# Patient Record
Sex: Female | Born: 1980
Health system: Southern US, Community
[De-identification: ages and names within clinical notes are randomized; demographics above are authoritative.]

## PROBLEM LIST (undated history)

## (undated) ENCOUNTER — Inpatient Hospital Stay (HOSPITAL_COMMUNITY): Payer: Self-pay

## (undated) ENCOUNTER — Ambulatory Visit

## (undated) DIAGNOSIS — F410 Panic disorder [episodic paroxysmal anxiety] without agoraphobia: Secondary | ICD-10-CM

## (undated) DIAGNOSIS — D649 Anemia, unspecified: Secondary | ICD-10-CM

## (undated) DIAGNOSIS — J45909 Unspecified asthma, uncomplicated: Secondary | ICD-10-CM

## (undated) DIAGNOSIS — F32A Depression, unspecified: Secondary | ICD-10-CM

## (undated) DIAGNOSIS — F419 Anxiety disorder, unspecified: Secondary | ICD-10-CM

## (undated) DIAGNOSIS — J40 Bronchitis, not specified as acute or chronic: Secondary | ICD-10-CM

## (undated) DIAGNOSIS — F329 Major depressive disorder, single episode, unspecified: Secondary | ICD-10-CM

## (undated) DIAGNOSIS — K219 Gastro-esophageal reflux disease without esophagitis: Secondary | ICD-10-CM

## (undated) HISTORY — DX: Depression, unspecified: F32.A

## (undated) HISTORY — DX: Major depressive disorder, single episode, unspecified: F32.9

## (undated) HISTORY — PX: BREAST SURGERY: SHX581

## (undated) HISTORY — PX: CHOLECYSTECTOMY: SHX55

---

## 1999-07-19 ENCOUNTER — Inpatient Hospital Stay (HOSPITAL_COMMUNITY): Admission: AD | Admit: 1999-07-19 | Discharge: 1999-07-21 | Payer: Self-pay | Admitting: Obstetrics

## 2001-01-09 ENCOUNTER — Encounter: Payer: Self-pay | Admitting: Emergency Medicine

## 2001-01-09 ENCOUNTER — Emergency Department (HOSPITAL_COMMUNITY): Admission: EM | Admit: 2001-01-09 | Discharge: 2001-01-09 | Payer: Self-pay | Admitting: Emergency Medicine

## 2001-06-03 ENCOUNTER — Encounter: Payer: Self-pay | Admitting: Obstetrics & Gynecology

## 2001-06-03 ENCOUNTER — Inpatient Hospital Stay (HOSPITAL_COMMUNITY): Admission: AD | Admit: 2001-06-03 | Discharge: 2001-06-03 | Payer: Self-pay | Admitting: Obstetrics & Gynecology

## 2001-11-09 ENCOUNTER — Inpatient Hospital Stay (HOSPITAL_COMMUNITY): Admission: AD | Admit: 2001-11-09 | Discharge: 2001-11-11 | Payer: Self-pay | Admitting: *Deleted

## 2002-03-22 ENCOUNTER — Encounter: Admission: RE | Admit: 2002-03-22 | Discharge: 2002-03-22 | Payer: Self-pay | Admitting: Internal Medicine

## 2002-03-22 ENCOUNTER — Encounter: Payer: Self-pay | Admitting: Internal Medicine

## 2004-04-24 ENCOUNTER — Emergency Department (HOSPITAL_COMMUNITY): Admission: EM | Admit: 2004-04-24 | Discharge: 2004-04-24 | Payer: Self-pay | Admitting: Emergency Medicine

## 2004-05-30 ENCOUNTER — Inpatient Hospital Stay (HOSPITAL_COMMUNITY): Admission: AD | Admit: 2004-05-30 | Discharge: 2004-05-30 | Payer: Self-pay | Admitting: Family Medicine

## 2004-08-10 ENCOUNTER — Inpatient Hospital Stay (HOSPITAL_COMMUNITY): Admission: AD | Admit: 2004-08-10 | Discharge: 2004-08-10 | Payer: Self-pay | Admitting: *Deleted

## 2004-08-14 ENCOUNTER — Inpatient Hospital Stay (HOSPITAL_COMMUNITY): Admission: AD | Admit: 2004-08-14 | Discharge: 2004-08-14 | Payer: Self-pay | Admitting: Obstetrics & Gynecology

## 2004-08-15 ENCOUNTER — Ambulatory Visit (HOSPITAL_COMMUNITY): Admission: RE | Admit: 2004-08-15 | Discharge: 2004-08-15 | Payer: Self-pay | Admitting: Obstetrics & Gynecology

## 2004-08-26 ENCOUNTER — Emergency Department (HOSPITAL_COMMUNITY): Admission: EM | Admit: 2004-08-26 | Discharge: 2004-08-26 | Payer: Self-pay | Admitting: Emergency Medicine

## 2004-08-28 ENCOUNTER — Inpatient Hospital Stay (HOSPITAL_COMMUNITY): Admission: AD | Admit: 2004-08-28 | Discharge: 2004-08-28 | Payer: Self-pay | Admitting: Obstetrics & Gynecology

## 2004-09-02 ENCOUNTER — Inpatient Hospital Stay (HOSPITAL_COMMUNITY): Admission: AD | Admit: 2004-09-02 | Discharge: 2004-09-03 | Payer: Self-pay | Admitting: Obstetrics & Gynecology

## 2004-10-09 ENCOUNTER — Inpatient Hospital Stay (HOSPITAL_COMMUNITY): Admission: AD | Admit: 2004-10-09 | Discharge: 2004-10-09 | Payer: Self-pay | Admitting: Obstetrics & Gynecology

## 2004-11-01 ENCOUNTER — Inpatient Hospital Stay (HOSPITAL_COMMUNITY): Admission: AD | Admit: 2004-11-01 | Discharge: 2004-11-03 | Payer: Self-pay | Admitting: Obstetrics

## 2005-06-05 ENCOUNTER — Emergency Department (HOSPITAL_COMMUNITY): Admission: EM | Admit: 2005-06-05 | Discharge: 2005-06-05 | Payer: Self-pay | Admitting: Emergency Medicine

## 2005-06-07 ENCOUNTER — Emergency Department (HOSPITAL_COMMUNITY): Admission: EM | Admit: 2005-06-07 | Discharge: 2005-06-07 | Payer: Self-pay | Admitting: Emergency Medicine

## 2005-06-12 ENCOUNTER — Emergency Department (HOSPITAL_COMMUNITY): Admission: EM | Admit: 2005-06-12 | Discharge: 2005-06-12 | Payer: Self-pay | Admitting: Emergency Medicine

## 2005-07-06 ENCOUNTER — Encounter (INDEPENDENT_AMBULATORY_CARE_PROVIDER_SITE_OTHER): Payer: Self-pay | Admitting: *Deleted

## 2005-07-06 ENCOUNTER — Observation Stay (HOSPITAL_COMMUNITY): Admission: EM | Admit: 2005-07-06 | Discharge: 2005-07-07 | Payer: Self-pay | Admitting: Emergency Medicine

## 2005-11-26 ENCOUNTER — Emergency Department (HOSPITAL_COMMUNITY): Admission: EM | Admit: 2005-11-26 | Discharge: 2005-11-26 | Payer: Self-pay | Admitting: Emergency Medicine

## 2006-08-25 ENCOUNTER — Emergency Department (HOSPITAL_COMMUNITY): Admission: EM | Admit: 2006-08-25 | Discharge: 2006-08-25 | Payer: Self-pay | Admitting: Family Medicine

## 2006-09-09 ENCOUNTER — Emergency Department (HOSPITAL_COMMUNITY): Admission: EM | Admit: 2006-09-09 | Discharge: 2006-09-09 | Payer: Self-pay | Admitting: Emergency Medicine

## 2007-05-14 ENCOUNTER — Ambulatory Visit: Payer: Self-pay | Admitting: Obstetrics and Gynecology

## 2007-05-14 ENCOUNTER — Inpatient Hospital Stay (HOSPITAL_COMMUNITY): Admission: AD | Admit: 2007-05-14 | Discharge: 2007-05-14 | Payer: Self-pay | Admitting: Obstetrics & Gynecology

## 2007-06-21 ENCOUNTER — Inpatient Hospital Stay (HOSPITAL_COMMUNITY): Admission: AD | Admit: 2007-06-21 | Discharge: 2007-06-23 | Payer: Self-pay | Admitting: Obstetrics & Gynecology

## 2007-06-21 ENCOUNTER — Ambulatory Visit: Payer: Self-pay | Admitting: Obstetrics & Gynecology

## 2007-12-29 ENCOUNTER — Emergency Department (HOSPITAL_COMMUNITY): Admission: EM | Admit: 2007-12-29 | Discharge: 2007-12-29 | Payer: Self-pay | Admitting: Emergency Medicine

## 2008-09-30 ENCOUNTER — Emergency Department (HOSPITAL_COMMUNITY): Admission: EM | Admit: 2008-09-30 | Discharge: 2008-09-30 | Payer: Self-pay | Admitting: Emergency Medicine

## 2009-06-22 ENCOUNTER — Emergency Department (HOSPITAL_COMMUNITY): Admission: EM | Admit: 2009-06-22 | Discharge: 2009-06-22 | Payer: Self-pay | Admitting: Emergency Medicine

## 2010-03-31 ENCOUNTER — Emergency Department (HOSPITAL_COMMUNITY): Admission: EM | Admit: 2010-03-31 | Discharge: 2010-03-31 | Payer: Self-pay | Admitting: Emergency Medicine

## 2010-07-23 ENCOUNTER — Emergency Department (HOSPITAL_COMMUNITY)
Admission: EM | Admit: 2010-07-23 | Discharge: 2010-07-23 | Payer: Self-pay | Source: Home / Self Care | Admitting: Family Medicine

## 2010-12-06 NOTE — Op Note (Signed)
NAMEMIAYA, LAFONTANT               ACCOUNT NO.:  [id number]   MEDICAL RECORD NO.:  [id number]          PATIENT TYPE:  OBV   LOCATION:  1320                         FACILITY:  [facility]   PHYSICIAN:  Angelia Mould. Derrell Lolling, M.D.DATE OF BIRTH:  1980-08-20   DATE OF PROCEDURE:  07/06/2005  DATE OF DISCHARGE:  07/07/2005                                 OPERATIVE REPORT   PREOPERATIVE DIAGNOSIS:  Acute and chronic cholecystitis with  cholelithiasis.   POSTOPERATIVE DIAGNOSIS:  Acute and chronic cholecystitis with  cholelithiasis.   OPERATION PERFORMED:  Laparoscopic cholecystectomy with intraoperative  cholangiogram.   SURGEON:  Angelia Mould. Derrell Lolling, M.D.   FIRST ASSISTANT:  Wilmon Arms. Tsuei, M.D.   OPERATIVE INDICATIONS:  This is a 30 year old black female with a 6-week  history of intermittent attacks of epigastric pain, nausea and vomiting. She  had an ultrasound last month which showed gallstones. She was scheduled for  elective surgery later this month but had another severe attack this morning  and came to the emergency room. Her liver function tests are normal. Her  white blood cell count and serum lipase were normal. Pregnancy test is  negative. We elected to go ahead with surgery at this time.   OPERATIVE FINDINGS:  The patient had acute and chronic inflammation of the  gallbladder. There were extensive omental adhesions to the gallbladder. The  anatomy of the cystic duct, cystic artery and common bile duct were  conventional. The cholangiogram showed normal intrahepatic and extrahepatic  bile ducts, no filling defects, and no obstruction with good flow of  contrast into the duodenum. The liver, stomach, duodenum, small intestine,  large intestine and peritoneal surfaces were grossly normal to inspection.   OPERATIVE TECHNIQUE:  Following the induction of general endotracheal  anesthesia, the patient's abdomen was prepped and draped in a sterile  fashion. The patient was identified.  Intravenous antibiotics were given.  0.5% Marcaine with epinephrine was used as local infiltration anesthetic. A  curved transverse incision was made at the superior rim of the umbilicus.  The fascia was incised in the midline and the abdominal cavity entered under  direct vision. A 10 mm Hassan trocar was inserted and secured with a  pursestring suture of #0 Vicryl. Pneumoperitoneum was created. The video  camera was inserted with visualization and findings as described above. A 10  mm trocar was placed in the subxiphoid region and two 5 mm trocars placed in  the right mid abdomen. The patient was positioned. The anatomy was not clear  initially and the gallbladder fundus looked as if it could be the transverse  colon and so I began taking the gallbladder down from the fundus downward  until I could clearly identify the gallbladder. I then grasped the fundus of  the gallbladder and lifted it  up and then we took about 15 minutes to take  all the adhesions down off the gallbladder. Hemostasis was achieved with  electrocautery. We dissected down toward the infundibulum of the gallbladder  and identified that. We retracted the infundibulum laterally. We mobilized  the infundibulum by dividing the peritoneal attachments  both medial lateral  until we had good mobilization. We found that the patient had a medium size  cystic duct and a small cystic artery intimately associated with the cystic  duct. The cystic artery was isolated, clipped and divided. We then isolate  the cystic duct and placed a metal clip on it close to the gallbladder. A  cholangiogram catheter was inserted into the cystic duct. A cholangiogram  was obtained using the C-arm. The cholangiogram was basically normal showing  no filling defect, no obstruction and normal intrahepatic and extrahepatic  anatomy. The cholangiogram catheter was removed. The cystic duct was secured  with three metal clips and divided. The gallbladder  was dissected from its  bed with electrocautery, placed the specimen bag and removed.   We spent a great deal of time irrigating to make sure that all of the bloody  fluid was removed. At the completion of the case, there was no bleeding and  no bile leak whatsoever. The irrigation fluid was completely clear and the  clips looked secure on the artery and duct. The trocars were removed under  direct vision. There is no bleeding from trocar sites. Pneumoperitoneum was  released. The fascia at the umbilicus was closed with #0 Vicryl suture. The  skin incisions were closed with subcuticular sutures of 4-0 Monocryl and  Steri-Strips. Clean bandages were placed and the patient taken to the  recovery room in stable condition. Estimated blood loss was about 25 mL.  Complications none. Sponge, needle and instrument counts were correct.      Angelia Mould. Derrell Lolling, M.D.  Electronically Signed     HMI/MEDQ  D:  07/06/2005  T:  07/08/2005  Job:  [id number]

## 2010-12-06 NOTE — H&P (Signed)
Elizabeth Dean, SUPPA               ACCOUNT NO.:  [id number]   MEDICAL RECORD NO.:  [id number]          PATIENT TYPE:  OBV   LOCATION:  1320                         FACILITY:  [facility]   PHYSICIAN:  Angelia Mould. Derrell Lolling, M.D.DATE OF BIRTH:  September 18, 1980   DATE OF ADMISSION:  07/06/2005  DATE OF DISCHARGE:                                HISTORY & PHYSICAL   CHIEF COMPLAINT:  Epigastric pain and gallstones.   HISTORY OF PRESENT ILLNESS:  This is a 30 year old black female who has a  six week history of intermittent attacks of postprandial epigastric pain,  back pain, repeated nausea and vomiting.  She had an ultrasound on June 05, 2005 which shows gallstones, otherwise normal with common bile duct  measuring 4.3 mm.  She has fatty food intolerance with attacks of pain  tending to occur after meals or at night.  She saw Dr. Chevis Pretty in our  office a week or two ago and was scheduled for cholecystectomy on July 09, 2005.  She awoke this morning at 6:00 A.M. with another attack of  epigastric pain, back pain, nausea and vomiting.  She had a little bit of  diarrhea today but she normally is constipated. She has not had any fever or  chills, has no history of liver disease, cardiac disease or urologic  problems.  She came to the emergency room, asked to see the emergency room  doctor, was worked up by Dr. Ignacia Palma.  I was then called to see her because  she was still having some pain.  She is admitted for cholecystectomy.   PAST MEDICAL HISTORY:  Mild asthma, otherwise negative.   CURRENT MEDICATIONS:  Albuterol inhaler PRN.   ALLERGIES:  No known drug allergies.   SOCIAL HISTORY:  The patient lives in [place].  She is engaged to be  married.  She has never been married before. She has three children.  She  works at [employer] on [employer].  She smokes cigarettes  daily.  She drinks alcohol but rarely.   FAMILY HISTORY:  Mother, father, sister and brother are  all living and well  without any medical problems.   REVIEW OF SYSTEMS:  Fifteen systems review of systems is performed and is  noncontributory except as described above.   PHYSICAL EXAMINATION:   PHYSICAL EXAMINATION:  GENERAL APPEARANCE: Overweight young black woman in  mild distress.  VITAL SIGNS:  Temperature 98.1, pulse 101, respirations 28, blood pressure  135/84.  HEENT: Eyes: Sclerae clear. Extraocular movements intact. Ears, nose, mouth  and throat, nose, lips, tongue and oropharynx are without gross lesions.  NECK:  Supple, nontender, no mass.  No jugular venous distention.  LUNGS:  Clear to auscultation.  No chest wall tenderness.  No costovertebral  angle tenderness.  HEART:  Regular rate and rhythm with no murmurs.  Radial and posterior  tibial pulses are palpable.  No peripheral edema.  ABDOMEN:  Obese, soft, tender in the epigastrium and right upper quadrant  but really no guarding, rebound or mass.  No hernia.  No scars noted.  EXTREMITIES:  She moves all four extremities well without pain or deformity.  NEUROLOGICAL:  No gross motor or sensory deficits.   LABORATORY DATA:  I reviewed the ultrasound which reveals a few gallstones  and a normal common bile duct.  Pregnancy test normal.  Hemoglobin 11.8,  white blood cell count 9,800.  Liver function tests are normal.  Lipase is  28.   ASSESSMENT:  1.  Symptomatic gallstones.  2.  Mild asthma.  3.  Obesity.   PLAN:  1.  The patient will be admitted and taken to the operating room for a      laparoscopic cholecystectomy with cholangiogram.  2.  I have discussed the indications and details of the surgery with her.      The risks and complications have been outlined, including, but no      limited to bleeding, infection, conversion to open laparotomy, injury to      adjacent structures such as the main bile duct or intestine with major      reconstructive surgery, wound problems such as infection or hernia,       cardiac, pulmonary and thromboembolic problems.  She seems to understand      these issues well.  At this time all of her questions are answered.  She      is in full agreement with this plan and would like to be admitted and      have the surgery today.      Angelia Mould. Derrell Lolling, M.D.  Electronically Signed     HMI/MEDQ  D:  07/06/2005  T:  07/07/2005  Job:  [id number]

## 2011-01-10 ENCOUNTER — Emergency Department (HOSPITAL_COMMUNITY)
Admission: EM | Admit: 2011-01-10 | Discharge: 2011-01-10 | Disposition: A | Discharge status: Home / Self Care | Payer: Self-pay | Attending: Emergency Medicine | Admitting: Emergency Medicine

## 2011-01-10 DIAGNOSIS — M546 Pain in thoracic spine: Secondary | ICD-10-CM | POA: Insufficient documentation

## 2011-01-10 DIAGNOSIS — W208XXA Other cause of strike by thrown, projected or falling object, initial encounter: Secondary | ICD-10-CM | POA: Insufficient documentation

## 2011-01-10 DIAGNOSIS — M79609 Pain in unspecified limb: Secondary | ICD-10-CM | POA: Insufficient documentation

## 2011-01-10 DIAGNOSIS — S20229A Contusion of unspecified back wall of thorax, initial encounter: Secondary | ICD-10-CM | POA: Insufficient documentation

## 2011-04-28 LAB — URINALYSIS, ROUTINE W REFLEX MICROSCOPIC
Bilirubin Urine: NEGATIVE
Glucose, UA: NEGATIVE
Ketones, ur: 15 — AB
Specific Gravity, Urine: 1.015

## 2011-04-28 LAB — TYPE AND SCREEN

## 2011-04-28 LAB — CBC
Hemoglobin: 9 — ABNORMAL LOW
MCHC: 32.3
MCHC: 32.8
MCV: 80.4
MCV: 81.8
Platelets: 186
Platelets: 227
RDW: 15.7 — ABNORMAL HIGH
RDW: 16.4 — ABNORMAL HIGH

## 2011-04-28 LAB — RPR: RPR Ser Ql: NONREACTIVE

## 2011-04-28 LAB — RAPID HIV SCREEN (WH-MAU): Rapid HIV Screen: NONREACTIVE

## 2011-04-28 LAB — RUBELLA SCREEN: Rubella: 23.9 — ABNORMAL HIGH

## 2011-04-28 LAB — RAPID URINE DRUG SCREEN, HOSP PERFORMED
Amphetamines: NOT DETECTED
Cocaine: NOT DETECTED
Tetrahydrocannabinol: POSITIVE — AB

## 2011-04-28 LAB — HEPATITIS B SURFACE ANTIGEN: Hepatitis B Surface Ag: NEGATIVE

## 2011-04-28 LAB — DIFFERENTIAL
Lymphs Abs: 2
Neutrophils Relative %: 67

## 2011-04-28 LAB — ABO/RH: ABO/RH(D): B POS

## 2011-04-28 LAB — GC/CHLAMYDIA PROBE AMP, URINE: Chlamydia, Swab/Urine, PCR: NEGATIVE

## 2011-04-30 LAB — URINALYSIS, ROUTINE W REFLEX MICROSCOPIC
Hgb urine dipstick: NEGATIVE
Ketones, ur: NEGATIVE
Nitrite: NEGATIVE
Urobilinogen, UA: 0.2

## 2011-07-19 ENCOUNTER — Emergency Department (HOSPITAL_COMMUNITY): Payer: Self-pay

## 2011-07-19 ENCOUNTER — Encounter: Payer: Self-pay | Admitting: *Deleted

## 2011-07-19 ENCOUNTER — Emergency Department (HOSPITAL_COMMUNITY)
Admission: EM | Admit: 2011-07-19 | Discharge: 2011-07-19 | Disposition: A | Discharge status: Home / Self Care | Payer: Self-pay | Attending: Emergency Medicine | Admitting: Emergency Medicine

## 2011-07-19 DIAGNOSIS — R221 Localized swelling, mass and lump, neck: Secondary | ICD-10-CM | POA: Insufficient documentation

## 2011-07-19 DIAGNOSIS — R059 Cough, unspecified: Secondary | ICD-10-CM | POA: Insufficient documentation

## 2011-07-19 DIAGNOSIS — R05 Cough: Secondary | ICD-10-CM | POA: Insufficient documentation

## 2011-07-19 DIAGNOSIS — J069 Acute upper respiratory infection, unspecified: Secondary | ICD-10-CM | POA: Insufficient documentation

## 2011-07-19 DIAGNOSIS — R0602 Shortness of breath: Secondary | ICD-10-CM | POA: Insufficient documentation

## 2011-07-19 DIAGNOSIS — R5381 Other malaise: Secondary | ICD-10-CM | POA: Insufficient documentation

## 2011-07-19 DIAGNOSIS — IMO0001 Reserved for inherently not codable concepts without codable children: Secondary | ICD-10-CM | POA: Insufficient documentation

## 2011-07-19 DIAGNOSIS — R220 Localized swelling, mass and lump, head: Secondary | ICD-10-CM | POA: Insufficient documentation

## 2011-07-19 DIAGNOSIS — J3489 Other specified disorders of nose and nasal sinuses: Secondary | ICD-10-CM | POA: Insufficient documentation

## 2011-07-19 DIAGNOSIS — R5383 Other fatigue: Secondary | ICD-10-CM | POA: Insufficient documentation

## 2011-07-19 NOTE — ED Notes (Signed)
Pt in c/o cough, congestion, fever, and body aches x4 days

## 2011-07-19 NOTE — ED Provider Notes (Signed)
History     CSN: [id number]  Arrival date & time 07/19/11  1538   First MD Initiated Contact with Patient 07/19/11 1648      Chief Complaint  Patient presents with  . URI    (Consider location/radiation/quality/duration/timing/severity/associated sxs/prior treatment) HPI Comments: Patient treating at home with Tylenol with some relief.  Patient is a 30 y.o. female presenting with URI. The history is provided by the patient.  URI The primary symptoms include fatigue and myalgias. Primary symptoms do not include fever, headaches, ear pain, sore throat, cough, wheezing, abdominal pain, nausea, vomiting or rash. The current episode started 2 days ago. The problem has not changed since onset. Symptoms associated with the illness include congestion and rhinorrhea. The illness is not associated with chills, plugged ear sensation or sinus pressure.    History reviewed. No pertinent past medical history.  History reviewed. No pertinent past surgical history.  History reviewed. No pertinent family history.  History  Substance Use Topics  . Smoking status: Never Smoker   . Smokeless tobacco: Not on file  . Alcohol Use: No    OB History    Grav Para Term Preterm Abortions TAB SAB Ect Mult Living                  Review of Systems  Constitutional: Positive for fatigue. Negative for fever and chills.  HENT: Positive for congestion and rhinorrhea. Negative for ear pain, sore throat and sinus pressure.   Eyes: Negative for discharge.  Respiratory: Negative for cough, shortness of breath and wheezing.   Cardiovascular: Negative for chest pain.  Gastrointestinal: Negative for nausea, vomiting, abdominal pain, diarrhea and constipation.  Genitourinary: Negative for dysuria.  Musculoskeletal: Positive for myalgias.  Skin: Negative for rash.  Neurological: Negative for headaches.  Psychiatric/Behavioral: Negative for confusion.    Allergies  Hydrocodone  Home Medications    Current Outpatient Rx  Name Route Sig Dispense Refill  . ACETAMINOPHEN 160 MG/5ML PO SOLN Oral Take 325 mg by mouth every 4 (four) hours as needed. Fever/headache       BP 135/80  Pulse 90  Temp(Src) 98.6 F (37 C) (Oral)  Resp 20  SpO2 100%  Physical Exam  Nursing note and vitals reviewed. Constitutional: She is oriented to person, place, and time. She appears well-developed and well-nourished.  HENT:  Head: Normocephalic and atraumatic.  Right Ear: Tympanic membrane and external ear normal.  Left Ear: Tympanic membrane and external ear normal.  Nose: Mucosal edema and rhinorrhea present.  Mouth/Throat: Uvula is midline and oropharynx is clear and moist.  Eyes: Pupils are equal, round, and reactive to light. Right eye exhibits no discharge. Left eye exhibits no discharge.  Neck: Normal range of motion. Neck supple.  Cardiovascular: Normal rate and regular rhythm.   No murmur heard. Pulmonary/Chest: Effort normal and breath sounds normal. No respiratory distress. She has no wheezes.  Abdominal: Soft. There is no tenderness. There is no rebound and no guarding.  Musculoskeletal: Normal range of motion.  Neurological: She is alert and oriented to person, place, and time.  Skin: Skin is warm and dry. No rash noted.  Psychiatric: She has a normal mood and affect.    ED Course  Procedures (including critical care time)  Labs Reviewed - No data to display Dg Chest 2 View  07/19/2011  *RADIOLOGY REPORT*  Clinical Data: Cough.  Shortness of breath.  Chest pain and congestion.  History of smoking.  CHEST - 2 VIEW  Comparison: 11/26/2005  Findings: Heart size is normal.  The lungs are free of focal consolidations and pleural effusions.  No evidence for pulmonary edema.  Surgical clips are identified in the upper abdomen. Visualized osseous structures have a normal appearance.  IMPRESSION: Negative exam.  Original Report Authenticated By: Patterson Hammersmith, M.D.     1. Upper  respiratory tract infection    5:50 PM patient seen and examined. Chest x-ray performed and is negative. Chest x-ray reviewed by myself. Patient informed of x-ray results. Patient counseled on supportive care for viral URI and s/s to return including worsening symptoms, persistent fever, persistent vomiting, or if they have any other concerns.  Urged to see PCP if symptoms persist for more than 3 days. Patient verbalizes understanding and agrees with plan.     MDM  Patient with symptoms consistent with a viral syndrome.  Vitals are stable, no fever.  No signs of dehydration.  Lung exam normal, no signs of pneumonia.  Supportive therapy indicated with return if symptoms worsen.           Eustace Moore [place], [place] 07/19/11 [phone]

## 2011-07-20 NOTE — ED Provider Notes (Signed)
Medical screening examination/treatment/procedure(s) were performed by non-physician practitioner and as supervising physician I was immediately available for consultation/collaboration.    Namish Krise L Kirk Sampley, MD 07/20/11 1125 

## 2011-10-01 ENCOUNTER — Emergency Department (INDEPENDENT_AMBULATORY_CARE_PROVIDER_SITE_OTHER)
Admission: EM | Admit: 2011-10-01 | Discharge: 2011-10-01 | Disposition: A | Discharge status: Home / Self Care | Payer: Self-pay | Source: Home / Self Care | Attending: Family Medicine | Admitting: Family Medicine

## 2011-10-01 ENCOUNTER — Encounter (HOSPITAL_COMMUNITY): Payer: Self-pay

## 2011-10-01 DIAGNOSIS — S838X9A Sprain of other specified parts of unspecified knee, initial encounter: Secondary | ICD-10-CM

## 2011-10-01 DIAGNOSIS — S76119A Strain of unspecified quadriceps muscle, fascia and tendon, initial encounter: Secondary | ICD-10-CM

## 2011-10-01 NOTE — ED Notes (Addendum)
Patient states she fell 3 days ago, and has pain in her left leg from hip to knee; c/o she cannot stand all day as her job requires; ambulatory w limp; NAD; Pain "7" for past 3 days, but has not taken any pain medication

## 2011-10-01 NOTE — ED Provider Notes (Signed)
History     CSN: [id number]  Arrival date & time 10/01/11  1043   First MD Initiated Contact with Patient 10/01/11 1153      Chief Complaint  Patient presents with  . Leg Pain    (Consider location/radiation/quality/duration/timing/severity/associated sxs/prior treatment) HPI Comments: Elizabeth Dean presents for evaluation of left thigh. Pain. She denies any pain at rest, but reports pain with extension of her lower leg. She reports that she "slipped" the other day. When she was washing. Her car. She reports that her left foot slipped in that she had to catch herself. Most of her weight was then put on her left thigh. She denies fall. She denies any numbness, tingling, or weakness in the leg.  Patient is a 31 y.o. female presenting with leg pain. The history is provided by the patient.  Leg Pain  The incident occurred more than 2 days ago. The incident occurred at home. There was no injury mechanism. The pain is present in the left thigh.    History reviewed. No pertinent past medical history.  Past Surgical History  Procedure Date  . Tubal ligation     History reviewed. No pertinent family history.  History  Substance Use Topics  . Smoking status: Never Smoker   . Smokeless tobacco: Not on file  . Alcohol Use: No    OB History    Grav Para Term Preterm Abortions TAB SAB Ect Mult Living                  Review of Systems  Constitutional: Negative.   HENT: Negative.   Eyes: Negative.   Respiratory: Negative.   Cardiovascular: Negative.   Gastrointestinal: Negative.   Genitourinary: Negative.   Musculoskeletal: Positive for myalgias.  Skin: Negative.   Neurological: Negative.     Allergies  Hydrocodone  Home Medications   Current Outpatient Rx  Name Route Sig Dispense Refill  . ACETAMINOPHEN 160 MG/5ML PO SOLN Oral Take 325 mg by mouth every 4 (four) hours as needed. Fever/headache       BP 132/84  Pulse 99  Temp(Src) 98.3 F (36.8 C) (Oral)  Resp 20   SpO2 98%  LMP 09/03/2011  Physical Exam  Nursing note and vitals reviewed. Constitutional: She is oriented to person, place, and time. She appears well-developed and well-nourished.  HENT:  Head: Normocephalic and atraumatic.  Eyes: EOM are normal.  Neck: Normal range of motion.  Pulmonary/Chest: Effort normal.  Musculoskeletal: Normal range of motion.       Left upper leg: She exhibits tenderness. She exhibits no bony tenderness, no swelling and no edema.       Legs: Neurological: She is alert and oriented to person, place, and time.  Skin: Skin is warm and dry.  Psychiatric: Her behavior is normal.    ED Course  Procedures (including critical care time)  Labs Reviewed - No data to display No results found.   1. Quadriceps muscle strain       MDM  Muscle strain; advised OTC anti-inflammatories        Renaee Munda, MD 10/01/11 1319

## 2011-10-01 NOTE — Discharge Instructions (Signed)
The pain is likely need to a strain of your left thigh muscle. This most likely occurred when you slipped during the carwash the other day. You may take one to 2 Aleve tablets twice a day for the next 7-10 days. You may also use mild heat at night. Return to care should your symptoms not improve, or worsen in any way, such as numbness, tingling, or weakness.

## 2012-11-19 ENCOUNTER — Encounter (HOSPITAL_COMMUNITY): Payer: Self-pay | Admitting: *Deleted

## 2012-11-19 ENCOUNTER — Emergency Department (HOSPITAL_COMMUNITY)
Admission: EM | Admit: 2012-11-19 | Discharge: 2012-11-19 | Disposition: A | Discharge status: Home / Self Care | Payer: Self-pay | Attending: Emergency Medicine | Admitting: Emergency Medicine

## 2012-11-19 DIAGNOSIS — R209 Unspecified disturbances of skin sensation: Secondary | ICD-10-CM | POA: Insufficient documentation

## 2012-11-19 DIAGNOSIS — K0889 Other specified disorders of teeth and supporting structures: Secondary | ICD-10-CM

## 2012-11-19 DIAGNOSIS — S63619A Unspecified sprain of unspecified finger, initial encounter: Secondary | ICD-10-CM

## 2012-11-19 DIAGNOSIS — K089 Disorder of teeth and supporting structures, unspecified: Secondary | ICD-10-CM | POA: Insufficient documentation

## 2012-11-19 DIAGNOSIS — X58XXXA Exposure to other specified factors, initial encounter: Secondary | ICD-10-CM | POA: Insufficient documentation

## 2012-11-19 DIAGNOSIS — Y939 Activity, unspecified: Secondary | ICD-10-CM | POA: Insufficient documentation

## 2012-11-19 DIAGNOSIS — J45909 Unspecified asthma, uncomplicated: Secondary | ICD-10-CM | POA: Insufficient documentation

## 2012-11-19 DIAGNOSIS — S6390XA Sprain of unspecified part of unspecified wrist and hand, initial encounter: Secondary | ICD-10-CM | POA: Insufficient documentation

## 2012-11-19 DIAGNOSIS — Y929 Unspecified place or not applicable: Secondary | ICD-10-CM | POA: Insufficient documentation

## 2012-11-19 MED ORDER — OXYCODONE-ACETAMINOPHEN 5-325 MG PO TABS: ORAL_TABLET | ORAL | Status: DC

## 2012-11-19 MED ORDER — PENICILLIN V POTASSIUM 500 MG PO TABS: 500.0000 mg | ORAL_TABLET | Freq: Four times a day (QID) | ORAL | Status: DC

## 2012-11-19 NOTE — ED Provider Notes (Signed)
History     CSN: [id number]  Arrival date & time 11/19/12  1140   First MD Initiated Contact with Patient 11/19/12 1213      Chief Complaint  Patient presents with  . Finger Injury  . Dental Pain    (Consider location/radiation/quality/duration/timing/severity/associated sxs/prior treatment) HPI  Elizabeth Dean is a 32 y.o. female complaining of with history of asthma presents with right index finger pain and pins and needles paresthesia  from the tip to the PIP joint x 5 hours. Pt stated when she woke up this morning she noticed that she had constant pain in her right index finger and decreased range of motion of her MCP and PIP joints.  As the morning progressed she only noticed the pain when using the finger or moving it. She denies any recent illness, trauma to that hand, redness, swelling, fever, N/V, difficulty swallowing her secretions.   History reviewed. No pertinent past medical history.  Past Surgical History  Procedure Laterality Date  . Tubal ligation      History reviewed. No pertinent family history.  History  Substance Use Topics  . Smoking status: Never Smoker   . Smokeless tobacco: Not on file  . Alcohol Use: No    OB History   Grav Para Term Preterm Abortions TAB SAB Ect Mult Living                  Review of Systems  Constitutional: Negative for fever.  HENT: Positive for dental problem. Negative for ear pain, facial swelling, drooling, trouble swallowing, neck pain, neck stiffness and voice change.   Respiratory: Negative for shortness of breath.   Cardiovascular: Negative for chest pain.  Gastrointestinal: Negative for nausea, vomiting, abdominal pain and diarrhea.  Musculoskeletal: Positive for arthralgias.  All other systems reviewed and are negative.    Allergies  Hydrocodone  Home Medications   Current Outpatient Rx  Name  Route  Sig  Dispense  Refill  . acetaminophen (TYLENOL) 160 MG/5ML solution   Oral   Take 960 mg by mouth  every 4 (four) hours as needed for pain. Fever/headache           BP 125/73  Pulse 95  Temp(Src) 97.4 F (36.3 C) (Oral)  Resp 18  SpO2 100%  LMP 11/05/2012  Physical Exam  Nursing note and vitals reviewed. Constitutional: She is oriented to person, place, and time. She appears well-developed and well-nourished. No distress.  HENT:  Head: Normocephalic. No trismus in the jaw.  Mouth/Throat: Uvula is midline and oropharynx is clear and moist. No edematous. No oropharyngeal exudate, posterior oropharyngeal edema, posterior oropharyngeal erythema or tonsillar abscesses.    Generally poor dentition, no gingival swelling, moderate tenderness to palpation and erythema of the oral gingiva on the posterior lower left side. tenderness to palpation. Patient is handling  secretions. There is no tenderness to palpation or firmness underneath tongue bilaterally. No trismus.    Eyes: Conjunctivae and EOM are normal.  Cardiovascular: Normal rate.   Pulmonary/Chest: Effort normal. No stridor.  Musculoskeletal: Normal range of motion.  There is no swelling, deformity, erythema to right index finger right hand. Patient has excellent active range of motion she is neurovascularly intact with sensation that can distinguish between pinprick and light touch. There is no consistent tenderness to palpation along the flexor and extensionsor tendon. Patient is mostly tender on the bilateral lateral sides of the proximal phalanx    Lymphadenopathy:    She has no cervical adenopathy.  Neurological: She is alert and oriented to person, place, and time.  Skin:  Skin shows no signs of trauma, no erythema, no induration, no puncture wounds  Psychiatric: She has a normal mood and affect.    ED Course  Procedures (including critical care time)  Labs Reviewed - No data to display No results found.   1. Pain, dental   2. Finger sprain, initial encounter       MDM   Elizabeth Dean is a 31 y.o.  female with dental pain and likely dental abscess and pain to right second digit with no injury, there is no signs of trauma to the digit no puncture wounds. Patient has excellent range of motion and I doubt infectious tenosynovitis. Will place the patient in a finger splint.  Patient also has pain in gingival swelling to the posterior right lower jaw. I doubt Ludwig's angina.    Filed Vitals:   11/19/12 1144 11/19/12 1346  BP: 125/73 138/83  Pulse: 95 91  Temp: 97.4 F (36.3 C) 97.7 F (36.5 C)  TempSrc: Oral Oral  Resp: 18 18  SpO2: 100% 96%     Pt verbalized understanding and agrees with care plan. Outpatient follow-up and return precautions given.    Discharge Medication List as of 11/19/2012  1:24 PM    START taking these medications   Details  oxyCODONE-acetaminophen (PERCOCET/ROXICET) 5-325 MG per tablet 1 to 2 tabs PO q6hrs  PRN for pain, Print    penicillin v potassium (VEETID) 500 MG tablet Take 1 tablet (500 mg total) by mouth 4 (four) times daily., Starting 11/19/2012, Until Discontinued, Print               Wynetta Emery, PA-C 11/19/12 1600  [place], PA-C 11/19/12 1601

## 2012-11-19 NOTE — ED Notes (Signed)
Reports waking up this am with pain to right index finger, no injury to it. Also having swelling to left lower side mouth. Airway intact.

## 2012-11-19 NOTE — ED Provider Notes (Signed)
Medical screening examination/treatment/procedure(s) were performed by non-physician practitioner and as supervising physician I was immediately available for consultation/collaboration.  Flint Melter, MD 11/19/12 2146

## 2012-11-19 NOTE — Progress Notes (Signed)
Orthopedic Tech Progress Note Patient Details:  Elizabeth Dean [birth year] [id number]  Ortho Devices Type of Ortho Device: Finger splint Ortho Device/Splint Location: right hand Ortho Device/Splint Interventions: Application   Yasiel Goyne 11/19/2012, 1:44 PM

## 2013-06-28 ENCOUNTER — Emergency Department (HOSPITAL_COMMUNITY)
Admission: EM | Admit: 2013-06-28 | Discharge: 2013-06-28 | Disposition: A | Discharge status: Home / Self Care | Payer: Self-pay | Attending: Emergency Medicine | Admitting: Emergency Medicine

## 2013-06-28 DIAGNOSIS — K047 Periapical abscess without sinus: Secondary | ICD-10-CM | POA: Insufficient documentation

## 2013-06-28 DIAGNOSIS — R51 Headache: Secondary | ICD-10-CM | POA: Insufficient documentation

## 2013-06-28 DIAGNOSIS — K029 Dental caries, unspecified: Secondary | ICD-10-CM | POA: Insufficient documentation

## 2013-06-28 MED ORDER — NAPROXEN 500 MG PO TABS: 500.0000 mg | ORAL_TABLET | Freq: Two times a day (BID) | ORAL | Status: DC

## 2013-06-28 MED ORDER — OXYCODONE-ACETAMINOPHEN 5-325 MG PO TABS
1.0000 | ORAL_TABLET | Freq: Once | ORAL | Status: AC
  Administered 2013-06-28: 1 via ORAL
  Filled 2013-06-28: qty 1

## 2013-06-28 MED ORDER — OXYCODONE-ACETAMINOPHEN 5-325 MG PO TABS: 1.0000 | ORAL_TABLET | Freq: Four times a day (QID) | ORAL | Status: DC | PRN

## 2013-06-28 NOTE — ED Notes (Signed)
Left jaw swelling  Since 1 week ago started as bump on inside  Teeth hurt had a hole in her tooth

## 2013-06-28 NOTE — ED Notes (Signed)
Dental pain x 1 week to left side. Pain increasing. No fevers.

## 2013-06-28 NOTE — ED Provider Notes (Signed)
CSN: [id number]     Arrival date & time 06/28/13  1158 History  This chart was scribed for non-physician practitioner working with Derwood Kaplan, MD by Ashley Jacobs, ED scribe. This patient was seen in room TR06C/TR06C and the patient's care was started at 2:47 PM   First MD Initiated Contact with Patient 06/28/13 1242     Chief Complaint  Patient presents with  . Dental Pain   (Consider location/radiation/quality/duration/timing/severity/associated sxs/prior Treatment) The history is provided by the patient and medical records. No language interpreter was used.   HPI Comments: Elizabeth Dean is a 32 y.o. female who presents to the Emergency Department complaining of left lower jaw pain. Pt reports the having a small bump on the inside of her jaw line that started one week ago. She state having a small "hole" in her left lower tooth.  Pt is experiencing moderate left sided facial swelling. She has tried tylenol for pain. Pt denies fever and trouble swallowing. She has allergies to hydrocodone and no prior medical complications.  No past medical history on file. Past Surgical History  Procedure Laterality Date  . Tubal ligation     No family history on file. History  Substance Use Topics  . Smoking status: Never Smoker   . Smokeless tobacco: Not on file  . Alcohol Use: No   OB History   Grav Para Term Preterm Abortions TAB SAB Ect Mult Living                 Review of Systems  Constitutional: Negative for fever.  HENT: Positive for dental problem and facial swelling. Negative for ear pain, sore throat and trouble swallowing.   Respiratory: Negative for shortness of breath and stridor.   Musculoskeletal: Negative for neck pain.  Skin: Negative for color change.  Neurological: Positive for headaches.    Allergies  Hydrocodone  Home Medications   Current Outpatient Rx  Name  Route  Sig  Dispense  Refill  . acetaminophen (TYLENOL) 500 MG tablet   Oral   Take 1,000 mg  by mouth every 6 (six) hours as needed for moderate pain.          BP 135/87  Pulse 87  Temp(Src) 98.1 F (36.7 C) (Oral)  Resp 18  SpO2 99% Physical Exam  Nursing note and vitals reviewed. Constitutional: She appears well-developed and well-nourished. No distress.  HENT:  Head: Normocephalic and atraumatic.  Right Ear: Tympanic membrane, external ear and ear canal normal.  Left Ear: Tympanic membrane, external ear and ear canal normal.  Nose: Nose normal.  Mouth/Throat: Uvula is midline, oropharynx is clear and moist and mucous membranes are normal. No trismus in the jaw. Abnormal dentition. Dental caries present. No dental abscesses or uvula swelling. No tonsillar abscesses.  2 cm diameter area of induration consistent with abscess.  Over the angle of the left mandible.  Eyes: Conjunctivae and EOM are normal. Pupils are equal, round, and reactive to light.  Neck: Normal range of motion. Neck supple. No tracheal deviation present.  No neck swelling or Ludwig's angina  Cardiovascular: Normal rate.   Pulmonary/Chest: Effort normal. No respiratory distress.  Abdominal: Soft. She exhibits no distension.  Musculoskeletal: Normal range of motion.  Lymphadenopathy:    She has no cervical adenopathy.  Neurological: She is alert.  Skin: Skin is warm and dry.  Psychiatric: She has a normal mood and affect. Her behavior is normal.    ED Course  Procedures (including critical care time) DIAGNOSTIC  STUDIES: Oxygen Saturation is 99% on room air, normal by my interpretation.    COORDINATION OF CARE: 12:51 PM Discussed course of care with pt which includes oxycodone 325 mg. Pt understands and agrees.  Labs Review Labs Reviewed - No data to display Imaging Review No results found.  EKG Interpretation   None      Patient seen and examined.   Vital signs reviewed and are as follows: Filed Vitals:   06/28/13 1207  BP: 135/87  Pulse: 87  Temp: 98.1 F (36.7 C)  Resp: 18     Patient counseled to take prescribed medications as directed, return with worsening facial or neck swelling, and to follow-up with their dentist as soon as possible.    MDM   1. Dental abscess    Patient with toothache, facial swelling, suspected abscess. Abscess is deep and I do not think I can easily drain this. No induration palpable along gumline and abscess is deep to inside of mouth. Exam unconcerning for Ludwig's angina or other deep tissue infection in neck.  Will treat with penicillin and pain medicine.  Urged patient to follow-up with dentist.    I personally performed the services described in this documentation, which was scribed in my presence. The recorded information has been reviewed and is accurate.     Renne Crigler, PA-C 06/29/13 2230

## 2013-06-30 NOTE — ED Provider Notes (Signed)
Medical screening examination/treatment/procedure(s) were performed by non-physician practitioner and as supervising physician I was immediately available for consultation/collaboration.  EKG Interpretation   None        Charnele Semple, MD 06/30/13 2002 

## 2013-07-02 ENCOUNTER — Encounter (HOSPITAL_COMMUNITY): Payer: Self-pay | Admitting: Emergency Medicine

## 2013-07-02 ENCOUNTER — Emergency Department (HOSPITAL_COMMUNITY)
Admission: EM | Admit: 2013-07-02 | Discharge: 2013-07-02 | Disposition: A | Discharge status: Home / Self Care | Payer: Self-pay | Attending: Emergency Medicine | Admitting: Emergency Medicine

## 2013-07-02 DIAGNOSIS — L0291 Cutaneous abscess, unspecified: Secondary | ICD-10-CM

## 2013-07-02 DIAGNOSIS — L0201 Cutaneous abscess of face: Secondary | ICD-10-CM | POA: Insufficient documentation

## 2013-07-02 DIAGNOSIS — K089 Disorder of teeth and supporting structures, unspecified: Secondary | ICD-10-CM | POA: Insufficient documentation

## 2013-07-02 DIAGNOSIS — L0211 Cutaneous abscess of neck: Secondary | ICD-10-CM | POA: Insufficient documentation

## 2013-07-02 DIAGNOSIS — L03211 Cellulitis of face: Secondary | ICD-10-CM | POA: Insufficient documentation

## 2013-07-02 DIAGNOSIS — Z79899 Other long term (current) drug therapy: Secondary | ICD-10-CM | POA: Insufficient documentation

## 2013-07-02 MED ORDER — OXYCODONE-ACETAMINOPHEN 5-325 MG PO TABS: 2.0000 | ORAL_TABLET | Freq: Four times a day (QID) | ORAL | Status: DC | PRN

## 2013-07-02 MED ORDER — SULFAMETHOXAZOLE-TRIMETHOPRIM 800-160 MG PO TABS: 1.0000 | ORAL_TABLET | Freq: Two times a day (BID) | ORAL | Status: DC

## 2013-07-02 MED ORDER — OXYCODONE-ACETAMINOPHEN 5-325 MG PO TABS
2.0000 | ORAL_TABLET | Freq: Once | ORAL | Status: AC
  Administered 2013-07-02: 2 via ORAL
  Filled 2013-07-02: qty 2

## 2013-07-02 MED ORDER — ONDANSETRON 4 MG PO TBDP
8.0000 mg | ORAL_TABLET | Freq: Once | ORAL | Status: AC
  Administered 2013-07-02: 8 mg via ORAL
  Filled 2013-07-02: qty 2

## 2013-07-02 MED ORDER — PROMETHAZINE HCL 25 MG PO TABS: 25.0000 mg | ORAL_TABLET | Freq: Four times a day (QID) | ORAL | Status: DC | PRN

## 2013-07-02 MED ORDER — CEPHALEXIN 500 MG PO CAPS: 500.0000 mg | ORAL_CAPSULE | Freq: Four times a day (QID) | ORAL | Status: DC

## 2013-07-02 NOTE — ED Provider Notes (Signed)
CSN: [id number]     Arrival date & time 07/02/13  1439 History   First MD Initiated Contact with Patient 07/02/13 1606     Chief Complaint  Patient presents with  . Abscess   (Consider location/radiation/quality/duration/timing/severity/associated sxs/prior Treatment) HPI Comments: Patient is a 32 year old female who presents today with facial abscess. She reports that she was seen here a week ago for dental abscess and had some improvement with antibiotics. Last night she developed a large painful abscess on the left side of her face and neck. This is close to where the initial dental abscess was. Her boyfriend tried to squeeze it last night and was able to get some drainage out. She has a throbbing, burning pain. Nothing makes the pain better. Touching it makes the pain worse. She denies any fever, chills, nausea, vomiting, abdominal pain.   The history is provided by the patient. No language interpreter was used.    History reviewed. No pertinent past medical history. Past Surgical History  Procedure Laterality Date  . Tubal ligation     History reviewed. No pertinent family history. History  Substance Use Topics  . Smoking status: Never Smoker   . Smokeless tobacco: Not on file  . Alcohol Use: No   OB History   Grav Para Term Preterm Abortions TAB SAB Ect Mult Living                 Review of Systems  Constitutional: Negative for fever and chills.  HENT: Positive for dental problem. Negative for trouble swallowing.   Respiratory: Negative for shortness of breath.   Cardiovascular: Negative for chest pain.  Gastrointestinal: Negative for nausea, vomiting and abdominal pain.  Skin: Positive for wound.  All other systems reviewed and are negative.    Allergies  Hydrocodone  Home Medications   Current Outpatient Rx  Name  Route  Sig  Dispense  Refill  . acetaminophen (TYLENOL) 500 MG tablet   Oral   Take 500 mg by mouth every 4 (four) hours as needed for moderate  pain.          . naproxen (NAPROSYN) 500 MG tablet   Oral   Take 1 tablet (500 mg total) by mouth 2 (two) times daily.   20 tablet   0   . oxyCODONE-acetaminophen (PERCOCET/ROXICET) 5-325 MG per tablet   Oral   Take 1-2 tablets by mouth every 6 (six) hours as needed for severe pain.   10 tablet   0    BP 130/85  Pulse 86  Temp(Src) 98.2 F (36.8 C) (Oral)  Resp 18  Wt 153 lb 12.8 oz (69.763 kg)  SpO2 100% Physical Exam  Nursing note and vitals reviewed. Constitutional: She is oriented to person, place, and time. She appears well-developed and well-nourished. No distress.  HENT:  Head: Normocephalic and atraumatic.  Right Ear: External ear normal.  Left Ear: External ear normal.  Nose: Nose normal.  Mouth/Throat: Oropharynx is clear and moist.  5 cm area of induration and erythema to left submandibular area. No streaking. Active drainage at this time. No evidence of dental abscess causing cutanoues abscess. No trismus, submental edema, or tongue elevation. Generally good dentition.   Eyes: Conjunctivae are normal.  Neck: Normal range of motion.  Cardiovascular: Normal rate, regular rhythm and normal heart sounds.   Pulmonary/Chest: Effort normal and breath sounds normal. No stridor. No respiratory distress. She has no wheezes. She has no rales.  Abdominal: Soft. She exhibits no distension.  Musculoskeletal:  Normal range of motion.  Neurological: She is alert and oriented to person, place, and time. She has normal strength.  Skin: Skin is warm and dry. She is not diaphoretic. No erythema.  Psychiatric: She has a normal mood and affect. Her behavior is normal.    ED Course  Procedures (including critical care time) Labs Review Labs Reviewed - No data to display Imaging Review No results found.  EKG Interpretation   None      INCISION AND DRAINAGE Performed by: Junious Silk Consent: Verbal consent obtained. Risks and benefits: risks, benefits and  alternatives were discussed Type: abscess  Body area: left submandibular  Anesthesia: local infiltration  Incision was made with a scalpel.  Local anesthetic: lidocaine 2%  Anesthetic total: 3 ml  Complexity: complex Blunt dissection to break up loculations  Drainage: mostly bloody  Drainage amount: mild  Patient tolerance: Patient tolerated the procedure well with no immediate complications.     MDM   1. Abscess and cellulitis    Patient with skin abscess amenable to incision and drainage.  Abscess was not large enough to warrant packing or drain,  wound recheck in 2 days. Encouraged home warm soaks and flushing.  Mild signs of cellulitis is surrounding skin.  Will d/c to home.  Keflex and Bactrim given. Dr. Effie Shy evaluated patient and agrees with plan.      Mora Bellman, PA-C 07/03/13 1344

## 2013-07-02 NOTE — ED Notes (Signed)
Hannah PA at bedside with heat pack and suture kit.

## 2013-07-02 NOTE — ED Notes (Signed)
Pt seen here for dental abscess to face; pt now has draining wound to right side of face; pt sts decreased swelling with antibiotics but now having draining to outside

## 2013-07-02 NOTE — ED Provider Notes (Signed)
  Face-to-face evaluation   History: She is evaluated the ED 3 days ago for dental pain. She was told to take naproxen and Percocet. Her symptoms worsened with swelling of the neck and drainage from an abscess. One day ago her boyfriend squeezed the area to get it better.  Physical exam: Alert, cooperative, in mild pain. There is a left sub-mandibular abscess with erythema and drainage centrally. The area is tensely swollen. Area of involvement of suspected abscess is about 4 cm in diameter. There is no trismus. There is no evident intra-oral abnormality, that would be considered a contributor to the swelling externally.  Medical screening examination/treatment/procedure(s) were conducted as a shared visit with non-physician practitioner(s) and myself.  I personally evaluated the patient during the encounter  Flint Melter, MD 07/03/13 [phone]

## 2013-10-08 ENCOUNTER — Emergency Department (HOSPITAL_COMMUNITY)
Admission: EM | Admit: 2013-10-08 | Discharge: 2013-10-08 | Disposition: A | Discharge status: Home / Self Care | Payer: No Typology Code available for payment source | Attending: Emergency Medicine | Admitting: Emergency Medicine

## 2013-10-08 ENCOUNTER — Encounter (HOSPITAL_COMMUNITY): Payer: Self-pay | Admitting: Emergency Medicine

## 2013-10-08 ENCOUNTER — Emergency Department (HOSPITAL_COMMUNITY): Payer: No Typology Code available for payment source

## 2013-10-08 DIAGNOSIS — Z792 Long term (current) use of antibiotics: Secondary | ICD-10-CM | POA: Insufficient documentation

## 2013-10-08 DIAGNOSIS — F1410 Cocaine abuse, uncomplicated: Secondary | ICD-10-CM | POA: Insufficient documentation

## 2013-10-08 DIAGNOSIS — R062 Wheezing: Secondary | ICD-10-CM

## 2013-10-08 DIAGNOSIS — Z791 Long term (current) use of non-steroidal anti-inflammatories (NSAID): Secondary | ICD-10-CM | POA: Insufficient documentation

## 2013-10-08 DIAGNOSIS — J45901 Unspecified asthma with (acute) exacerbation: Secondary | ICD-10-CM | POA: Insufficient documentation

## 2013-10-08 DIAGNOSIS — F411 Generalized anxiety disorder: Secondary | ICD-10-CM | POA: Insufficient documentation

## 2013-10-08 DIAGNOSIS — R0600 Dyspnea, unspecified: Secondary | ICD-10-CM

## 2013-10-08 DIAGNOSIS — F419 Anxiety disorder, unspecified: Secondary | ICD-10-CM

## 2013-10-08 HISTORY — DX: Bronchitis, not specified as acute or chronic: J40

## 2013-10-08 HISTORY — DX: Unspecified asthma, uncomplicated: J45.909

## 2013-10-08 LAB — CBC
HEMATOCRIT: 39.8 % (ref 36.0–46.0)
HEMOGLOBIN: 13.2 g/dL (ref 12.0–15.0)
MCH: 27.4 pg (ref 26.0–34.0)
MCHC: 33.2 g/dL (ref 30.0–36.0)
MCV: 82.7 fL (ref 78.0–100.0)
Platelets: 335 10*3/uL (ref 150–400)
RBC: 4.81 MIL/uL (ref 3.87–5.11)
RDW: 17.2 % — ABNORMAL HIGH (ref 11.5–15.5)
WBC: 15.7 10*3/uL — ABNORMAL HIGH (ref 4.0–10.5)

## 2013-10-08 LAB — RAPID URINE DRUG SCREEN, HOSP PERFORMED
Amphetamines: NOT DETECTED
BENZODIAZEPINES: NOT DETECTED
Barbiturates: NOT DETECTED
COCAINE: POSITIVE — AB
Opiates: NOT DETECTED
Tetrahydrocannabinol: NOT DETECTED

## 2013-10-08 LAB — COMPREHENSIVE METABOLIC PANEL
ALT: 19 U/L (ref 0–35)
AST: 19 U/L (ref 0–37)
Albumin: 3.8 g/dL (ref 3.5–5.2)
Alkaline Phosphatase: 83 U/L (ref 39–117)
BUN: 15 mg/dL (ref 6–23)
CALCIUM: 9.1 mg/dL (ref 8.4–10.5)
CHLORIDE: 100 meq/L (ref 96–112)
CO2: 19 meq/L (ref 19–32)
CREATININE: 0.72 mg/dL (ref 0.50–1.10)
GLUCOSE: 148 mg/dL — AB (ref 70–99)
Potassium: 4.2 mEq/L (ref 3.7–5.3)
Sodium: 134 mEq/L — ABNORMAL LOW (ref 137–147)
Total Protein: 7.6 g/dL (ref 6.0–8.3)

## 2013-10-08 MED ORDER — LORAZEPAM 1 MG PO TABS
1.0000 mg | ORAL_TABLET | Freq: Once | ORAL | Status: AC
  Administered 2013-10-08: 1 mg via ORAL
  Filled 2013-10-08: qty 1

## 2013-10-08 MED ORDER — ALBUTEROL SULFATE HFA 108 (90 BASE) MCG/ACT IN AERS: 2.0000 | INHALATION_SPRAY | RESPIRATORY_TRACT | Status: DC | PRN

## 2013-10-08 NOTE — ED Notes (Signed)
Patient began having upward gaze and making noises with her throat. RN assessed patient for seizure. Patient was able to follow commands during event. Patient airway intact. Patient stops when you talk to her.

## 2013-10-08 NOTE — Discharge Instructions (Signed)
Use albuterol inhaler as need for wheezing.  Avoid smoking, or any drug use. Follow up with primary care doctor in coming week. Return to ER if worse, new symptoms, chest pain, trouble breathing, fevers, severe headache, other concern.   You were given medication in the ER that can cause drowsiness - no driving for the next 4 hours.      Bronchospasm, Adult A bronchospasm is when the tubes that carry air in and out of your lungs (airwarys) spasm or tighten. During a bronchospasm it is hard to breathe. This is because the airways get smaller. A bronchospasm can be triggered by:  Allergies. These may be to animals, pollen, food, or mold.  Infection. This is a common cause of bronchospasm.  Exercise.  Irritants. These include pollution, cigarette smoke, strong odors, aerosol sprays, and paint fumes.  Weather changes.  Stress.  Being emotional. HOME CARE   Always have a plan for getting help. Know when to call your doctor and local emergency services (911 in the [place] Know where you can get emergency care.  Only take medicines as told by your doctor.  If you were prescribed an inhaler or nebulizer machine, ask your doctor how to use it correctly. Always use a spacer with your inhaler if you were given one.  Stay calm during an attack. Try to relax and breathe more slowly.  Control your home environment:  Change your heating and air conditioning filter at least once a month.  Limit your use of fireplaces and wood stoves.  Do not  smoke. Do not  allow smoking in your home.  Avoid perfumes and fragrances.  Get rid of pests (such as roaches and mice) and their droppings.  Throw away plants if you see mold on them.  Keep your house clean and dust free.  Replace carpet with wood, tile, or vinyl flooring. Carpet can trap dander and dust.  Use allergy-proof pillows, mattress covers, and box spring covers.  Wash bed sheets and blankets every week in hot water. Dry them in  a dryer.  Use blankets that are made of polyester or cotton.  Wash hands frequently. GET HELP IF:  You have muscle aches.  You have chest pain.  The thick spit you spit or cough up (sputum) changes from clear or white to yellow, green, gray, or bloody.  The thick spit you spit or cough up gets thicker.  There are problems that may be related to the medicine you are given such as:  A rash.  Itching.  Swelling.  Trouble breathing. GET HELP RIGHT AWAY IF:  You feel you cannot breathe or catch your breath.  You cannot stop coughing.  Your treatment is not helping you breathe better. MAKE SURE YOU:   Understand these instructions.  Will watch your condition.  Will get help right away if you are not doing well or get worse. Document Released: 05/04/2009 Document Revised: 03/09/2013 Document Reviewed: 12/28/2012 [facility] Patient Information 2014 [place].   Asthma Attack Prevention Although there is no way to prevent asthma from starting, you can take steps to control the disease and reduce its symptoms. Learn about your asthma and how to control it. Take an active role to control your asthma by working with your health care provider to create and follow an asthma action plan. An asthma action plan guides you in:  Taking your medicines properly.  Avoiding things that set off your asthma or make your asthma worse (asthma triggers).  Tracking your level  of asthma control.  Responding to worsening asthma.  Seeking emergency care when needed. To track your asthma, keep records of your symptoms, check your peak flow number using a handheld device that shows how well air moves out of your lungs (peak flow meter), and get regular asthma checkups.  WHAT ARE SOME WAYS TO PREVENT AN ASTHMA ATTACK?  Take medicines as directed by your health care provider.  Keep track of your asthma symptoms and level of control.  With your health care provider, write a detailed plan  for taking medicines and managing an asthma attack. Then be sure to follow your action plan. Asthma is an ongoing condition that needs regular monitoring and treatment.  Identify and avoid asthma triggers. Many outdoor allergens and irritants (such as pollen, mold, cold air, and air pollution) can trigger asthma attacks. Find out what your asthma triggers are and take steps to avoid them.  Monitor your breathing. Learn to recognize warning signs of an attack, such as coughing, wheezing, or shortness of breath. Your lung function may decrease before you notice any signs or symptoms, so regularly measure and record your peak airflow with a home peak flow meter.  Identify and treat attacks early. If you act quickly, you are less likely to have a severe attack. You will also need less medicine to control your symptoms. When your peak flow measurements decrease and alert you to an upcoming attack, take your medicine as instructed and immediately stop any activity that may have triggered the attack. If your symptoms do not improve, get medical help.  Pay attention to increasing quick-relief inhaler use. If you find yourself relying on your quick-relief inhaler, your asthma is not under control. See your health care provider about adjusting your treatment. WHAT CAN MAKE MY SYMPTOMS WORSE? A number of common things can set off or make your asthma symptoms worse and cause temporary increased inflammation of your airways. Keep track of your asthma symptoms for several weeks, detailing all the environmental and emotional factors that are linked with your asthma. When you have an asthma attack, go back to your asthma diary to see which factor, or combination of factors, might have contributed to it. Once you know what these factors are, you can take steps to control many of them. If you have allergies and asthma, it is important to take asthma prevention steps at home. Minimizing contact with the substance to which  you are allergic will help prevent an asthma attack. Some triggers and ways to avoid these triggers are: Animal Dander:  Some people are allergic to the flakes of skin or dried saliva from animals with fur or feathers.   There is no such thing as a hypoallergenic dog or cat breed. All dogs or cats can cause allergies, even if they don't shed.  Keep these pets out of your home.  If you are not able to keep a pet outdoors, keep the pet out of your bedroom and other sleeping areas at all times, and keep the door closed.  Remove carpets and furniture covered with cloth from your home. If that is not possible, keep the pet away from fabric-covered furniture and carpets. Dust Mites: Many people with asthma are allergic to dust mites. Dust mites are tiny bugs that are found in every home in mattresses, pillows, carpets, fabric-covered furniture, bedcovers, clothes, stuffed toys, and other fabric-covered items.   Cover your mattress in a special dust-proof cover.  Cover your pillow in a special dust-proof cover, or  wash the pillow each week in hot water. Water must be hotter than 130 F (54.4 C) to kill dust mites. Cold or warm water used with detergent and bleach can also be effective.  Wash the sheets and blankets on your bed each week in hot water.  Try not to sleep or lie on cloth-covered cushions.  Call ahead when traveling and ask for a smoke-free hotel room. Bring your own bedding and pillows in case the hotel only supplies feather pillows and down comforters, which may contain dust mites and cause asthma symptoms.  Remove carpets from your bedroom and those laid on concrete, if you can.  Keep stuffed toys out of the bed, or wash the toys weekly in hot water or cooler water with detergent and bleach. Cockroaches: Many people with asthma are allergic to the droppings and remains of cockroaches.   Keep food and garbage in closed containers. Never leave food out.  Use poison baits,  traps, powders, gels, or paste (for example, boric acid).  If a spray is used to kill cockroaches, stay out of the room until the odor goes away. Indoor Mold:  Fix leaky faucets, pipes, or other sources of water that have mold around them.  Clean floors and moldy surfaces with a fungicide or diluted bleach.  Avoid using humidifiers, vaporizers, or swamp coolers. These can spread molds through the air. Pollen and Outdoor Mold:  When pollen or mold spore counts are high, try to keep your windows closed.  Stay indoors with windows closed from late morning to afternoon. Pollen and some mold spore counts are highest at that time.  Ask your health care provider whether you need to take anti-inflammatory medicine or increase your dose of the medicine before your allergy season starts. Other Irritants to Avoid:  Tobacco smoke is an irritant. If you smoke, ask your health care provider how you can quit. Ask family members to quit smoking too. Do not allow smoking in your home or car.  If possible, do not use a wood-burning stove, kerosene heater, or fireplace. Minimize exposure to all sources of smoke, including to incense, candles, fires, and fireworks.  Try to stay away from strong odors and sprays, such as perfume, talcum powder, hair spray, and paints.  Decrease humidity in your home and use an indoor air cleaning device. Reduce indoor humidity to below 60%. Dehumidifiers or central air conditioners can do this.  Decrease house dust exposure by changing furnace and air cooler filters frequently.  Try to have someone else vacuum for you once or twice a week. Stay out of rooms while they are being vacuumed and for a short while afterward.  If you vacuum, use a dust mask from a hardware store, a double-layered or microfilter vacuum cleaner bag, or a vacuum cleaner with a HEPA filter.  Sulfites in foods and beverages can be irritants. Do not drink beer or wine or eat dried fruit, processed  potatoes, or shrimp if they cause asthma symptoms.  Cold air can trigger an asthma attack. Cover your nose and mouth with a scarf on cold or windy days.  Several health conditions can make asthma more difficult to manage, including a runny nose, sinus infections, reflux disease, psychological stress, and sleep apnea. Work with your health care provider to manage these conditions.  Avoid close contact with people who have a respiratory infection such as a cold or the flu, since your asthma symptoms may get worse if you catch the infection. Wash your hands  thoroughly after touching items that may have been handled by people with a respiratory infection.  Get a flu shot every year to protect against the flu virus, which often makes asthma worse for days or weeks. Also get a pneumonia shot if you have not previously had one. Unlike the flu shot, the pneumonia shot does not need to be given yearly. Medicines:  Talk to your health care provider about whether it is safe for you to take aspirin or non-steroidal anti-inflammatory medicines (NSAIDs). In a small number of people with asthma, aspirin and NSAIDs can cause asthma attacks. These medicines must be avoided by people who have known aspirin-sensitive asthma. It is important that people with aspirin-sensitive asthma read labels of all over-the-counter medicines used to treat pain, colds, coughs, and fever.  Beta blockers and ACE inhibitors are other medicines you should discuss with your health care provider. HOW CAN I FIND OUT WHAT I AM ALLERGIC TO? Ask your asthma health care provider about allergy skin testing or blood testing (the RAST test) to identify the allergens to which you are sensitive. If you are found to have allergies, the most important thing to do is to try to avoid exposure to any allergens that you are sensitive to as much as possible. Other treatments for allergies, such as medicines and allergy shots (immunotherapy) are available.    CAN I EXERCISE? Follow your health care provider's advice regarding asthma treatment before exercising. It is important to maintain a regular exercise program, but vigorous exercise, or exercise in cold, humid, or dry environments can cause asthma attacks, especially for those people who have exercise-induced asthma. Document Released: 06/25/2009 Document Revised: 03/09/2013 Document Reviewed: 01/12/2013 [facility] Patient Information 2014 [place].

## 2013-10-08 NOTE — ED Provider Notes (Signed)
CSN: [id number]     Arrival date & time 10/08/13  1933 History   First MD Initiated Contact with Patient 10/08/13 1941     Chief Complaint  Patient presents with  . Shortness of Breath  . Wheezing     (Consider location/radiation/quality/duration/timing/severity/associated sxs/prior Treatment) Patient is a 33 y.o. female presenting with shortness of breath and wheezing. The history is provided by the patient.  Shortness of Breath Associated symptoms: fever and wheezing   Associated symptoms: no abdominal pain, no chest pain, no headaches, no neck pain, no rash, no sore throat and no vomiting   Wheezing Associated symptoms: fever and shortness of breath   Associated symptoms: no chest pain, no headaches, no rash and no sore throat   pt with hx asthma, bronchitis, presents w sob.  Pt was at work, smoking, when felt sob, wheezing.  She normally uses inhaler on prn basis. Called for ems. ems gave neb tx, during neb tx pt starting intermittently gazing upwards. Pt denies change in vision, eye pain or headache. No numbness/weakness. No hx same. Can look down and around room. Appears anxious. Notes recent stressors. Denies depression. Pt notes occasional non prod cough. No sore throat or other uri c/o. No fever or chills. No chest pain. No leg pain or swelling.      Past Medical History  Diagnosis Date  . Asthma   . Bronchitis    Past Surgical History  Procedure Laterality Date  . Tubal ligation     No family history on file. History  Substance Use Topics  . Smoking status: Never Smoker   . Smokeless tobacco: Not on file  . Alcohol Use: No   OB History   Grav Para Term Preterm Abortions TAB SAB Ect Mult Living                 Review of Systems  Constitutional: Positive for fever. Negative for chills.  HENT: Negative for sore throat.   Eyes: Negative for pain, redness and visual disturbance.  Respiratory: Positive for shortness of breath and wheezing.   Cardiovascular:  Negative for chest pain, palpitations and leg swelling.  Gastrointestinal: Negative for vomiting, abdominal pain and diarrhea.  Genitourinary: Negative for dysuria and flank pain.  Musculoskeletal: Negative for back pain and neck pain.  Skin: Negative for rash.  Neurological: Negative for weakness, numbness and headaches.  Hematological: Does not bruise/bleed easily.  Psychiatric/Behavioral: Negative for confusion.      Allergies  Hydrocodone  Home Medications   Current Outpatient Rx  Name  Route  Sig  Dispense  Refill  . acetaminophen (TYLENOL) 500 MG tablet   Oral   Take 500 mg by mouth every 4 (four) hours as needed for moderate pain.          . cephALEXin (KEFLEX) 500 MG capsule   Oral   Take 1 capsule (500 mg total) by mouth 4 (four) times daily.   40 capsule   0   . naproxen (NAPROSYN) 500 MG tablet   Oral   Take 1 tablet (500 mg total) by mouth 2 (two) times daily.   20 tablet   0   . oxyCODONE-acetaminophen (PERCOCET/ROXICET) 5-325 MG per tablet   Oral   Take 1-2 tablets by mouth every 6 (six) hours as needed for severe pain.   10 tablet   0   . oxyCODONE-acetaminophen (PERCOCET/ROXICET) 5-325 MG per tablet   Oral   Take 2 tablets by mouth every 6 (six) hours as needed  for severe pain.   12 tablet   0   . promethazine (PHENERGAN) 25 MG tablet   Oral   Take 1 tablet (25 mg total) by mouth every 6 (six) hours as needed for nausea or vomiting.   12 tablet   0   . sulfamethoxazole-trimethoprim (SEPTRA DS) 800-160 MG per tablet   Oral   Take 1 tablet by mouth every 12 (twelve) hours.   20 tablet   0    BP 166/105  Pulse 109  Temp(Src) 98 F (36.7 C) (Oral)  Resp 31  SpO2 100%  LMP 08/27/2013 Physical Exam  Nursing note and vitals reviewed. Constitutional: She is oriented to person, place, and time. She appears well-developed and well-nourished. No distress.  HENT:  Head: Atraumatic.  Nose: Nose normal.  Mouth/Throat: Oropharynx is clear  and moist.  Eyes: Conjunctivae and EOM are normal. Pupils are equal, round, and reactive to light. No scleral icterus.  Neck: Neck supple. No tracheal deviation present. No thyromegaly present.  Cardiovascular: Normal rate, regular rhythm, normal heart sounds and intact distal pulses.  Exam reveals no gallop and no friction rub.   No murmur heard. Pulmonary/Chest: Effort normal and breath sounds normal. No respiratory distress.  Abdominal: Soft. Normal appearance. She exhibits no distension. There is no tenderness.  Musculoskeletal: She exhibits no edema and no tenderness.  Neurological: She is alert and oriented to person, place, and time. No cranial nerve deficit.  Motor intact bil, st 5/5. No pronator drift. sens intact. Steady gait.   Skin: Skin is warm and dry. No rash noted. She is not diaphoretic.  Psychiatric:  Anxious.     ED Course  Procedures (including critical care time)  Results for orders placed during the hospital encounter of 10/08/13  CBC      Result Value Ref Range   WBC 15.7 (*) 4.0 - 10.5 K/uL   RBC 4.81  3.87 - 5.11 MIL/uL   Hemoglobin 13.2  12.0 - 15.0 g/dL   HCT 39.8  36.0 - 46.0 %   MCV 82.7  78.0 - 100.0 fL   MCH 27.4  26.0 - 34.0 pg   MCHC 33.2  30.0 - 36.0 g/dL   RDW 17.2 (*) 11.5 - 15.5 %   Platelets 335  150 - 400 K/uL  COMPREHENSIVE METABOLIC PANEL      Result Value Ref Range   Sodium 134 (*) 137 - 147 mEq/L   Potassium 4.2  3.7 - 5.3 mEq/L   Chloride 100  96 - 112 mEq/L   CO2 19  19 - 32 mEq/L   Glucose, Bld 148 (*) 70 - 99 mg/dL   BUN 15  6 - 23 mg/dL   Creatinine, Ser 0.72  0.50 - 1.10 mg/dL   Calcium 9.1  8.4 - 10.5 mg/dL   Total Protein 7.6  6.0 - 8.3 g/dL   Albumin 3.8  3.5 - 5.2 g/dL   AST 19  0 - 37 U/L   ALT 19  0 - 35 U/L   Alkaline Phosphatase 83  39 - 117 U/L   Total Bilirubin <0.2 (*) 0.3 - 1.2 mg/dL   GFR calc non Af Amer >90  >90 mL/min   GFR calc Af Amer >90  >90 mL/min  URINE RAPID DRUG SCREEN (HOSP PERFORMED)       Result Value Ref Range   Opiates NONE DETECTED  NONE DETECTED   Cocaine POSITIVE (*) NONE DETECTED   Benzodiazepines NONE DETECTED  NONE DETECTED  Amphetamines NONE DETECTED  NONE DETECTED   Tetrahydrocannabinol NONE DETECTED  NONE DETECTED   Barbiturates NONE DETECTED  NONE DETECTED   Dg Chest 2 View  10/08/2013   CLINICAL DATA:  Chest pain.  EXAM: CHEST  2 VIEW  COMPARISON:  07/19/2011  FINDINGS: The heart size and mediastinal contours are within normal limits. Both lungs are clear. The visualized skeletal structures are unremarkable.  IMPRESSION: No active cardiopulmonary disease.   Electronically Signed   By: Earle Gell M.D.   On: 10/08/2013 21:32      MDM   Pt appears anxious. Offered reassurance.  Ativan 1 mg po.  Reviewed nursing notes and prior charts for additional history.   Chest cta.  Labs, cxr.  Recheck pt breathing comfortably. No sob. No wheezing, good air exchange.  Pt is calm and alert. No cp, no sob. No headache. No return of anxiety or gazing upwards.   Recheck pt remains asymptomatic, watching tv, conversing w family/friend - appears stable for d/c.      Mirna Mires, MD 10/08/13 2228

## 2013-10-08 NOTE — ED Notes (Signed)
Pt is being discharged with family. VS stable.

## 2013-10-08 NOTE — ED Notes (Signed)
Patient was at work and developed SOB and wheezing. Patient has hx of asthma and bronchitis. Patient normally has a rescue inhaler, but left it at home today. EMS arrived and patient was mid 90's on RA. EMS gave patient 5mg  of Albuterol, and during the treatment patient developed an upward gaze. Patient states this is the first time this has happened. EMS report patient was negative for stroke symptoms. Patient can look down when asked. A/o x4, NAD

## 2013-10-09 ENCOUNTER — Encounter (HOSPITAL_COMMUNITY): Payer: Self-pay | Admitting: Emergency Medicine

## 2013-10-09 ENCOUNTER — Emergency Department (HOSPITAL_COMMUNITY)
Admission: EM | Admit: 2013-10-09 | Discharge: 2013-10-09 | Disposition: A | Discharge status: Home / Self Care | Payer: No Typology Code available for payment source | Attending: Emergency Medicine | Admitting: Emergency Medicine

## 2013-10-09 DIAGNOSIS — M62838 Other muscle spasm: Secondary | ICD-10-CM | POA: Insufficient documentation

## 2013-10-09 DIAGNOSIS — Z87891 Personal history of nicotine dependence: Secondary | ICD-10-CM | POA: Insufficient documentation

## 2013-10-09 DIAGNOSIS — F1410 Cocaine abuse, uncomplicated: Secondary | ICD-10-CM | POA: Insufficient documentation

## 2013-10-09 DIAGNOSIS — J45901 Unspecified asthma with (acute) exacerbation: Secondary | ICD-10-CM | POA: Insufficient documentation

## 2013-10-09 DIAGNOSIS — Z79899 Other long term (current) drug therapy: Secondary | ICD-10-CM | POA: Insufficient documentation

## 2013-10-09 DIAGNOSIS — R000 Tachycardia, unspecified: Secondary | ICD-10-CM | POA: Insufficient documentation

## 2013-10-09 LAB — ETHANOL: Alcohol, Ethyl (B): <11 mg/dL (ref 0–11)

## 2013-10-09 LAB — CBC WITH DIFFERENTIAL/PLATELET
BASOS ABS: 0 10*3/uL (ref 0.0–0.1)
Basophils Relative: 0 % (ref 0–1)
EOS PCT: 0 % (ref 0–5)
Eosinophils Absolute: 0 10*3/uL (ref 0.0–0.7)
HEMATOCRIT: 39.2 % (ref 36.0–46.0)
HEMOGLOBIN: 12.6 g/dL (ref 12.0–15.0)
LYMPHS PCT: 12 % (ref 12–46)
Lymphs Abs: 1.6 10*3/uL (ref 0.7–4.0)
MCH: 27 pg (ref 26.0–34.0)
MCHC: 32.1 g/dL (ref 30.0–36.0)
MCV: 83.9 fL (ref 78.0–100.0)
MONO ABS: 0.5 10*3/uL (ref 0.1–1.0)
MONOS PCT: 4 % (ref 3–12)
NEUTROS ABS: 12 10*3/uL — AB (ref 1.7–7.7)
Neutrophils Relative %: 85 % — ABNORMAL HIGH (ref 43–77)
Platelets: 316 10*3/uL (ref 150–400)
RBC: 4.67 MIL/uL (ref 3.87–5.11)
RDW: 17.3 % — AB (ref 11.5–15.5)
WBC: 14.2 10*3/uL — AB (ref 4.0–10.5)

## 2013-10-09 LAB — BASIC METABOLIC PANEL
BUN: 11 mg/dL (ref 6–23)
CHLORIDE: 101 meq/L (ref 96–112)
CO2: 20 meq/L (ref 19–32)
CREATININE: 0.72 mg/dL (ref 0.50–1.10)
Calcium: 9 mg/dL (ref 8.4–10.5)
GFR calc Af Amer: >90 mL/min (ref >90)
GFR calc non Af Amer: >90 mL/min (ref >90)
Glucose, Bld: 171 mg/dL — ABNORMAL HIGH (ref 70–99)
POTASSIUM: 4.1 meq/L (ref 3.7–5.3)
Sodium: 138 mEq/L (ref 137–147)

## 2013-10-09 LAB — CK: Total CK: 380 U/L — ABNORMAL HIGH (ref 7–177)

## 2013-10-09 MED ORDER — DIAZEPAM 5 MG/ML IJ SOLN
5.0000 mg | Freq: Once | INTRAMUSCULAR | Status: AC
  Administered 2013-10-09: 5 mg via INTRAMUSCULAR
  Filled 2013-10-09: qty 2

## 2013-10-09 NOTE — ED Provider Notes (Signed)
History/physical exam/procedure(s) were performed by non-physician practitioner and as supervising physician I was immediately available for consultation/collaboration. I have reviewed all notes and am in agreement with care and plan.   Shaune Pollack, MD 10/09/13 [phone]

## 2013-10-09 NOTE — ED Provider Notes (Signed)
CSN: [id number]     Arrival date & time 10/09/13  1820 History   First MD Initiated Contact with Patient 10/09/13 1946     Chief Complaint  Patient presents with  . mouth will lock up   . shob      (Consider location/radiation/quality/duration/timing/severity/associated sxs/prior Treatment) Patient is a 33 y.o. female presenting with musculoskeletal pain. The history is provided by the patient. No language interpreter was used.  Muscle Pain Pertinent negatives include no abdominal pain, chest pain, chills, coughing, fever, nausea or neck pain. Associated symptoms comments: She reports her jaw becomes immobile and she loses control of her tongue, being unable to move it at all. She feels that her tongue is swollen. No difficulty swallowing. She has some shortness of breath. She was seen in the ED last night for shortness of breath and treated with improvement with inhalers. She reports the jaw and tongue symptoms started today. .    Past Medical History  Diagnosis Date  . Asthma   . Bronchitis    Past Surgical History  Procedure Laterality Date  . Tubal ligation    . Cholecystectomy     No family history on file. History  Substance Use Topics  . Smoking status: Former Smoker -- 0.50 packs/day    Types: Cigarettes  . Smokeless tobacco: Not on file  . Alcohol Use: 1.2 oz/week    2 Cans of beer per week   OB History   Grav Para Term Preterm Abortions TAB SAB Ect Mult Living                 Review of Systems  Constitutional: Negative for fever and chills.  HENT:       See HPI.  Respiratory: Positive for shortness of breath. Negative for cough.   Cardiovascular: Negative.  Negative for chest pain.  Gastrointestinal: Negative.  Negative for nausea and abdominal pain.  Musculoskeletal: Negative.  Negative for neck pain.  Skin: Negative.  Negative for color change and wound.  Neurological: Negative.       Allergies  Hydrocodone  Home Medications   Current Outpatient  Rx  Name  Route  Sig  Dispense  Refill  . acetaminophen (TYLENOL) 500 MG tablet   Oral   Take 1,000 mg by mouth 2 (two) times daily as needed for moderate pain.         Marland Kitchen albuterol (PROVENTIL HFA;VENTOLIN HFA) 108 (90 BASE) MCG/ACT inhaler   Inhalation   Inhale 2 puffs into the lungs every 4 (four) hours as needed for wheezing or shortness of breath.   1 Inhaler   1    BP 145/95  Pulse 115  Temp(Src) 97.4 F (36.3 C) (Oral)  Resp 26  SpO2 98%  LMP 08/27/2013 Physical Exam  Constitutional: She is oriented to person, place, and time. She appears well-developed and well-nourished. No distress.  HENT:  Head: Normocephalic.  TMJ and mandible are tense, passively fixed in position, mobile with active movement. No swelling of tongue. Oropharynx benign, uvula midline. The patient appears unable to retract the tongue into her mouth after exam, pushing it into the oral cavity with her fingers but it does not stay.  Eyes: Pupils are equal, round, and reactive to light.  Neck: Normal range of motion.  Cardiovascular: Regular rhythm.  Tachycardia present.   No murmur heard. Pulmonary/Chest: Effort normal. She has no wheezes. She has no rales. She exhibits no tenderness.  Abdominal: Soft. There is no tenderness. There is no rebound  and no guarding.  Musculoskeletal: Normal range of motion.  Neurological: She is alert and oriented to person, place, and time.  Skin: Skin is warm and dry. No erythema.  Psychiatric: She has a normal mood and affect.    ED Course  Procedures (including critical care time) Labs Review Labs Reviewed  CK - Abnormal; Notable for the following:    Total CK 380 (*)    All other components within normal limits  BASIC METABOLIC PANEL - Abnormal; Notable for the following:    Glucose, Bld 171 (*)    All other components within normal limits  CBC WITH DIFFERENTIAL - Abnormal; Notable for the following:    WBC 14.2 (*)    RDW 17.3 (*)    Neutrophils Relative %  85 (*)    Neutro Abs 12.0 (*)    All other components within normal limits  ETHANOL   Imaging Review Dg Chest 2 View  10/08/2013   CLINICAL DATA:  Chest pain.  EXAM: CHEST  2 VIEW  COMPARISON:  07/19/2011  FINDINGS: The heart size and mediastinal contours are within normal limits. Both lungs are clear. The visualized skeletal structures are unremarkable.  IMPRESSION: No active cardiopulmonary disease.   Electronically Signed   By: Earle Gell M.D.   On: 10/08/2013 21:32     EKG Interpretation None      MDM   Final diagnoses:  None    1. Muscle spasm 2. Cocaine abuse  Patient feels her symptoms have completely resolved with Valium in the ED. She has full control of jaw and tongue. Discussed lab findings and encouraged PCP follow up as needed. Stable for discharge.     Dewaine Oats, PA-C 10/09/13 2220

## 2013-10-09 NOTE — Discharge Instructions (Signed)
°  Muscle Cramps and Spasms °Muscle cramps and spasms occur when a muscle or muscles tighten and you have no control over this tightening (involuntary muscle contraction). They are a common problem and can develop in any muscle. The most common place is in the calf muscles of the leg. Both muscle cramps and muscle spasms are involuntary muscle contractions, but they also have differences:  °· Muscle cramps are sporadic and painful. They may last a few seconds to a quarter of an hour. Muscle cramps are often more forceful and last longer than muscle spasms. °· Muscle spasms may or may not be painful. They may also last just a few seconds or much longer. °CAUSES  °It is uncommon for cramps or spasms to be due to a serious underlying problem. In many cases, the cause of cramps or spasms is unknown. Some common causes are:  °· Overexertion.   °· Overuse from repetitive motions (doing the same thing over and over).   °· Remaining in a certain position for a long period of time.   °· Improper preparation, form, or technique while performing a sport or activity.   °· Dehydration.   °· Injury.   °· Side effects of some medicines.   °· Abnormally low levels of the salts and ions in your blood (electrolytes), especially potassium and calcium. This could happen if you are taking water pills (diuretics) or you are pregnant.   °Some underlying medical problems can make it more likely to develop cramps or spasms. These include, but are not limited to:  °· Diabetes.   °· Parkinson disease.   °· Hormone disorders, such as thyroid problems.   °· Alcohol abuse.   °· Diseases specific to muscles, joints, and bones.   °· Blood vessel disease where not enough blood is getting to the muscles.   °HOME CARE INSTRUCTIONS  °· Stay well hydrated. Drink enough water and fluids to keep your urine clear or pale yellow. °· It may be helpful to massage, stretch, and relax the affected muscle. °· For tight or tense muscles, use a warm towel, heating  pad, or hot shower water directed to the affected area. °· If you are sore or have pain after a cramp or spasm, applying ice to the affected area may relieve discomfort. °· Put ice in a plastic bag. °· Place a towel between your skin and the bag. °· Leave the ice on for 15-20 minutes, 03-04 times a day. °· Medicines used to treat a known cause of cramps or spasms may help reduce their frequency or severity. Only take over-the-counter or prescription medicines as directed by your caregiver. °SEEK MEDICAL CARE IF:  °Your cramps or spasms get more severe, more frequent, or do not improve over time.  °MAKE SURE YOU:  °· Understand these instructions. °· Will watch your condition. °· Will get help right away if you are not doing well or get worse. °Document Released: 12/27/2001 Document Revised: 11/01/2012 Document Reviewed: 06/23/2012 °ExitCare® Patient Information ©2014 [employer]. ° ° °

## 2013-10-09 NOTE — ED Notes (Signed)
Pt was seen at [facility] ED last night for asthma and given inhaler. Pt states that today she used inhaler but not working. Pt mouth "will lock up and she will have trouble breathing". Pt O2 98% on room air and speech is normal.

## 2013-10-25 ENCOUNTER — Ambulatory Visit: Payer: No Typology Code available for payment source | Attending: Internal Medicine

## 2013-11-09 ENCOUNTER — Encounter (HOSPITAL_COMMUNITY): Payer: Self-pay | Admitting: Emergency Medicine

## 2013-11-09 ENCOUNTER — Emergency Department (INDEPENDENT_AMBULATORY_CARE_PROVIDER_SITE_OTHER)
Admission: EM | Admit: 2013-11-09 | Discharge: 2013-11-09 | Disposition: A | Discharge status: Home / Self Care | Payer: No Typology Code available for payment source | Source: Home / Self Care | Attending: Family Medicine | Admitting: Family Medicine

## 2013-11-09 DIAGNOSIS — S335XXA Sprain of ligaments of lumbar spine, initial encounter: Secondary | ICD-10-CM

## 2013-11-09 DIAGNOSIS — S39012A Strain of muscle, fascia and tendon of lower back, initial encounter: Secondary | ICD-10-CM

## 2013-11-09 MED ORDER — IBUPROFEN 800 MG PO TABS
ORAL_TABLET | ORAL | Status: AC
  Filled 2013-11-09: qty 1

## 2013-11-09 MED ORDER — NAPROXEN 500 MG PO TABS: 500.0000 mg | ORAL_TABLET | Freq: Two times a day (BID) | ORAL | Status: DC

## 2013-11-09 MED ORDER — IBUPROFEN 800 MG PO TABS
800.0000 mg | ORAL_TABLET | Freq: Once | ORAL | Status: AC
  Administered 2013-11-09: 800 mg via ORAL

## 2013-11-09 NOTE — Discharge Instructions (Signed)
Lumbosacral Strain Lumbosacral strain is a strain of any of the parts that make up your lumbosacral vertebrae. Your lumbosacral vertebrae are the bones that make up the lower third of your backbone. Your lumbosacral vertebrae are held together by muscles and tough, fibrous tissue (ligaments).  CAUSES  A sudden blow to your back can cause lumbosacral strain. Also, anything that causes an excessive stretch of the muscles in the low back can cause this strain. This is typically seen when people exert themselves strenuously, fall, lift heavy objects, bend, or crouch repeatedly. RISK FACTORS  Physically demanding work.  Participation in pushing or pulling sports or sports that require sudden twist of the back (tennis, golf, baseball).  Weight lifting.  Excessive lower back curvature.  Forward-tilted pelvis.  Weak back or abdominal muscles or both.  Tight hamstrings. SIGNS AND SYMPTOMS  Lumbosacral strain may cause pain in the area of your injury or pain that moves (radiates) down your leg.  DIAGNOSIS Your health care provider can often diagnose lumbosacral strain through a physical exam. In some cases, you may need tests such as X-ray exams.  TREATMENT  Treatment for your lower back injury depends on many factors that your clinician will have to evaluate. However, most treatment will include the use of anti-inflammatory medicines. HOME CARE INSTRUCTIONS   Avoid hard physical activities (tennis, racquetball, waterskiing) if you are not in proper physical condition for it. This may aggravate or create problems.  If you have a back problem, avoid sports requiring sudden body movements. Swimming and walking are generally safer activities.  Maintain good posture.  Maintain a healthy weight.  For acute conditions, you may put ice on the injured area.  Put ice in a plastic bag.  Place a towel between your skin and the bag.  Leave the ice on for 20 minutes, 2 3 times a day.  When the  low back starts healing, stretching and strengthening exercises may be recommended. SEEK MEDICAL CARE IF:  Your back pain is getting worse.  You experience severe back pain not relieved with medicines. SEEK IMMEDIATE MEDICAL CARE IF:   You have numbness, tingling, weakness, or problems with the use of your arms or legs.  There is a change in bowel or bladder control.  You have increasing pain in any area of the body, including your belly (abdomen).  You notice shortness of breath, dizziness, or feel faint.  You feel sick to your stomach (nauseous), are throwing up (vomiting), or become sweaty.  You notice discoloration of your toes or legs, or your feet get very cold. MAKE SURE YOU:   Understand these instructions.  Will watch your condition.  Will get help right away if you are not doing well or get worse. Document Released: 04/16/2005 Document Revised: 04/27/2013 Document Reviewed: 02/23/2013 ExitCare Patient Information 2014 [employer].  

## 2013-11-09 NOTE — ED Provider Notes (Signed)
CSN: [id number]     Arrival date & time 11/09/13  1451 History   First MD Initiated Contact with Patient 11/09/13 1532     Chief Complaint  Patient presents with  . 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 Denies GI or GU incontinence. No changes in strength or sensation of lower extremities. No radiation of pain. States she is uncomfortable when she transitions from sitting to standing and vice versa. No perineal anesthesia  Patient is a 33 y.o. female presenting with back pain. The history is provided by the patient.  Back Pain Location:  Lumbar spine Quality:  Aching Radiates to:  Does not radiate Pain severity:  Mild Onset quality:  Gradual Duration:  1 day   Past Medical History  Diagnosis Date  . Asthma   . Bronchitis    Past Surgical History  Procedure Laterality Date  . Tubal ligation    . Cholecystectomy     History reviewed. No pertinent family history. History  Substance Use Topics  . Smoking status: Former Smoker -- 0.50 packs/day    Types: Cigarettes  . Smokeless tobacco: Not on file  . Alcohol Use: 1.2 oz/week    2 Cans of beer per week   OB History   Grav Para Term Preterm Abortions TAB SAB Ect Mult Living                 Review of Systems  Musculoskeletal: Positive for back pain.  All other systems reviewed and are negative.   Allergies  Hydrocodone  Home Medications   Prior to Admission medications   Medication Sig Start Date End Date Taking? Authorizing Provider  acetaminophen (TYLENOL) 500 MG tablet Take 1,000 mg by mouth 2 (two) times daily as needed for moderate pain.    Historical Provider, MD  albuterol (PROVENTIL HFA;VENTOLIN HFA) 108 (90 BASE) MCG/ACT inhaler Inhale 2 puffs into the lungs every 4 (four)  hours as needed for wheezing or shortness of breath. 10/08/13   Mirna Mires, MD   BP 124/74  Pulse 72  Temp(Src) 98.6 F (37 C) (Oral)  Resp 18  SpO2 100%  LMP 11/08/2013 Physical Exam  Nursing note and vitals reviewed. Constitutional: She is oriented to person, place, and time. She appears well-developed and well-nourished. No distress.  HENT:  Head: Normocephalic and atraumatic.  Eyes: Conjunctivae are normal.  Neck: Normal range of motion. Neck supple.  Cardiovascular: Normal rate, regular rhythm and normal heart sounds.   Pulmonary/Chest: Effort normal and breath sounds normal.  Abdominal: Soft. Bowel sounds are normal. She exhibits no distension. There is no tenderness.  Musculoskeletal: Normal range of motion.       Lumbar back: She exhibits tenderness. She exhibits normal range of motion, no bony tenderness, no swelling, no edema, no deformity and no laceration.       Back:  Reports generalized discomfort in area outlined on diagram. CSM exam of lower extremities normal.   Neurological: She is alert and oriented to person, place, and time.  Skin: Skin is warm and dry.  Psychiatric: She has a normal mood and affect. Her behavior is normal.    ED Course  Procedures (including critical care time) Labs Review Labs Reviewed - No data to display  Results for orders placed during the hospital encounter of 10/09/13  CK  Result Value Ref Range   Total CK 380 (*) 7 - 177 U/L  BASIC METABOLIC PANEL      Result Value Ref Range   Sodium 138  137 - 147 mEq/L   Potassium 4.1  3.7 - 5.3 mEq/L   Chloride 101  96 - 112 mEq/L   CO2 20  19 - 32 mEq/L   Glucose, Bld 171 (*) 70 - 99 mg/dL   BUN 11  6 - 23 mg/dL   Creatinine, Ser 0.72  0.50 - 1.10 mg/dL   Calcium 9.0  8.4 - 10.5 mg/dL   GFR calc non Af Amer >90  >90 mL/min   GFR calc Af Amer >90  >90 mL/min  CBC WITH DIFFERENTIAL      Result Value Ref Range   WBC 14.2 (*) 4.0 - 10.5 K/uL   RBC 4.67  3.87 - 5.11 MIL/uL    Hemoglobin 12.6  12.0 - 15.0 g/dL   HCT 39.2  36.0 - 46.0 %   MCV 83.9  78.0 - 100.0 fL   MCH 27.0  26.0 - 34.0 pg   MCHC 32.1  30.0 - 36.0 g/dL   RDW 17.3 (*) 11.5 - 15.5 %   Platelets 316  150 - 400 K/uL   Neutrophils Relative % 85 (*) 43 - 77 %   Neutro Abs 12.0 (*) 1.7 - 7.7 K/uL   Lymphocytes Relative 12  12 - 46 %   Lymphs Abs 1.6  0.7 - 4.0 K/uL   Monocytes Relative 4  3 - 12 %   Monocytes Absolute 0.5  0.1 - 1.0 K/uL   Eosinophils Relative 0  0 - 5 %   Eosinophils Absolute 0.0  0.0 - 0.7 K/uL   Basophils Relative 0  0 - 1 %   Basophils Absolute 0.0  0.0 - 0.1 K/uL  ETHANOL      Result Value Ref Range   Alcohol, Ethyl (B) <11  0 - 11 mg/dL   Imaging Review No results found.   MDM   1. Lumbar strain   Will provide rx for 5 day course of NSAIDs for patient and excuse from work until 11-11-2013. Provided information regarding lifting precautions and back exercises.    [place], [place] 11/09/13 [phone]

## 2013-11-09 NOTE — ED Notes (Signed)
Pt  Reports  Back pain    That  Occurred last pm     denys  Any  specefic  Injury        However      Is   Worse       Denys  Any  Urinary  Symptoms  Ambulated  To  Room  With a  Somewhat  Slow  Gait

## 2013-11-10 NOTE — ED Provider Notes (Signed)
Medical screening examination/treatment/procedure(s) were performed by a resident physician or non-physician practitioner and as the supervising physician I was immediately available for consultation/collaboration.  Lynne Leader, MD    Gregor Hams, MD 11/10/13 [phone]

## 2014-01-02 ENCOUNTER — Encounter: Payer: Self-pay | Admitting: Internal Medicine

## 2014-01-02 ENCOUNTER — Ambulatory Visit: Payer: No Typology Code available for payment source | Attending: Internal Medicine | Admitting: Internal Medicine

## 2014-01-02 VITALS — BP 129/93 | HR 100 | Temp 98.4°F | Resp 16 | Ht <= 58 in | Wt 176.0 lb

## 2014-01-02 DIAGNOSIS — M62838 Other muscle spasm: Secondary | ICD-10-CM | POA: Insufficient documentation

## 2014-01-02 DIAGNOSIS — Z9181 History of falling: Secondary | ICD-10-CM | POA: Insufficient documentation

## 2014-01-02 DIAGNOSIS — M5380 Other specified dorsopathies, site unspecified: Secondary | ICD-10-CM | POA: Insufficient documentation

## 2014-01-02 DIAGNOSIS — F329 Major depressive disorder, single episode, unspecified: Secondary | ICD-10-CM | POA: Insufficient documentation

## 2014-01-02 DIAGNOSIS — R296 Repeated falls: Secondary | ICD-10-CM

## 2014-01-02 DIAGNOSIS — F17200 Nicotine dependence, unspecified, uncomplicated: Secondary | ICD-10-CM | POA: Insufficient documentation

## 2014-01-02 DIAGNOSIS — J45909 Unspecified asthma, uncomplicated: Secondary | ICD-10-CM | POA: Insufficient documentation

## 2014-01-02 LAB — CBC WITH DIFFERENTIAL/PLATELET
BASOS PCT: 0 % (ref 0–1)
Basophils Absolute: 0 10*3/uL (ref 0.0–0.1)
EOS ABS: 0.5 10*3/uL (ref 0.0–0.7)
EOS PCT: 4 % (ref 0–5)
HCT: 35.8 % — ABNORMAL LOW (ref 36.0–46.0)
HEMOGLOBIN: 11.7 g/dL — AB (ref 12.0–15.0)
LYMPHS ABS: 3.2 10*3/uL (ref 0.7–4.0)
Lymphocytes Relative: 26 % (ref 12–46)
MCH: 26.2 pg (ref 26.0–34.0)
MCHC: 32.7 g/dL (ref 30.0–36.0)
MCV: 80.3 fL (ref 78.0–100.0)
MONO ABS: 0.7 10*3/uL (ref 0.1–1.0)
MONOS PCT: 6 % (ref 3–12)
NEUTROS PCT: 64 % (ref 43–77)
Neutro Abs: 7.9 10*3/uL — ABNORMAL HIGH (ref 1.7–7.7)
Platelets: 305 10*3/uL (ref 150–400)
RBC: 4.46 MIL/uL (ref 3.87–5.11)
RDW: 16.8 % — ABNORMAL HIGH (ref 11.5–15.5)
WBC: 12.3 10*3/uL — ABNORMAL HIGH (ref 4.0–10.5)

## 2014-01-02 LAB — COMPLETE METABOLIC PANEL WITH GFR
ALK PHOS: 65 U/L (ref 39–117)
ALT: 45 U/L — ABNORMAL HIGH (ref 0–35)
AST: 40 U/L — ABNORMAL HIGH (ref 0–37)
Albumin: 3.9 g/dL (ref 3.5–5.2)
BILIRUBIN TOTAL: 0.3 mg/dL (ref 0.2–1.2)
BUN: 12 mg/dL (ref 6–23)
CO2: 27 meq/L (ref 19–32)
CREATININE: 0.63 mg/dL (ref 0.50–1.10)
Calcium: 8.7 mg/dL (ref 8.4–10.5)
Chloride: 105 mEq/L (ref 96–112)
GLUCOSE: 79 mg/dL (ref 70–99)
Potassium: 3.9 mEq/L (ref 3.5–5.3)
SODIUM: 139 meq/L (ref 135–145)
TOTAL PROTEIN: 6.4 g/dL (ref 6.0–8.3)

## 2014-01-02 LAB — LIPID PANEL
CHOL/HDL RATIO: 5.6 ratio
CHOLESTEROL: 129 mg/dL (ref 0–200)
HDL: 23 mg/dL — AB (ref >39)
LDL Cholesterol: 29 mg/dL (ref 0–99)
Triglycerides: 383 mg/dL — ABNORMAL HIGH (ref <150)
VLDL: 77 mg/dL — ABNORMAL HIGH (ref 0–40)

## 2014-01-02 LAB — TSH: TSH: 1.761 u[IU]/mL (ref 0.350–4.500)

## 2014-01-02 MED ORDER — CYCLOBENZAPRINE HCL 10 MG PO TABS: 10.0000 mg | ORAL_TABLET | Freq: Three times a day (TID) | ORAL | Status: DC | PRN

## 2014-01-02 MED ORDER — FLUOXETINE HCL 20 MG PO CAPS: 20.0000 mg | ORAL_CAPSULE | Freq: Every day | ORAL | Status: DC

## 2014-01-02 MED ORDER — NAPROXEN 500 MG PO TABS: 500.0000 mg | ORAL_TABLET | Freq: Two times a day (BID) | ORAL | Status: DC

## 2014-01-02 NOTE — Patient Instructions (Signed)

## 2014-01-02 NOTE — Progress Notes (Signed)
Patient ID: Elizabeth Dean, female   DOB: 06-02-1981, 33 y.o.   MRN: [id number]   Elizabeth Dean, is a 33 y.o. female  [id number]:[id number]  [id number]  DOB - 06-17-1981  CC:  Chief Complaint  Patient presents with  . Establish Care       HPI: Elizabeth Dean is a 33 y.o. female here today to establish medical care. Patient's major medical history include depression, she recently injured her back about 3 weeks ago and has since been in pain with muscle spasms. Patient stated that 4 days ago she fell again injured her face. She was seen in urgent care in April for strained lower back. She verbalized her major depression with several stressors in her life especially financial stresses and family. Patient was tearful during this encounter. She denies any suicidal ideations or thoughts. She also has history of asthma bronchitis. Patient has No headache, No chest pain, No abdominal pain - No Nausea, No new weakness tingling or numbness, No Cough - SOB.  Allergies  Allergen Reactions  . Hydrocodone Other (See Comments)    Flares gallbladder   Past Medical History  Diagnosis Date  . Asthma   . Bronchitis    Current Outpatient Prescriptions on File Prior to Visit  Medication Sig Dispense Refill  . acetaminophen (TYLENOL) 500 MG tablet Take 1,000 mg by mouth 2 (two) times daily as needed for moderate pain.      Marland Kitchen albuterol (PROVENTIL HFA;VENTOLIN HFA) 108 (90 BASE) MCG/ACT inhaler Inhale 2 puffs into the lungs every 4 (four) hours as needed for wheezing or shortness of breath.  1 Inhaler  1   No current facility-administered medications on file prior to visit.   Family History  Problem Relation Age of Onset  . Heart disease Maternal Grandmother   . Hypertension Maternal Grandmother    History   Social History  . Marital Status: Single    Spouse Name: N/A    Number of Children: N/A  . Years of Education: N/A   Occupational History  . Not on file.   Social History Main Topics  .  Smoking status: Light Tobacco Smoker -- 0.50 packs/day    Types: Cigarettes  . Smokeless tobacco: Not on file  . Alcohol Use: 1.2 oz/week    2 Cans of beer per week  . Drug Use: No  . Sexual Activity: Not on file   Other Topics Concern  . Not on file   Social History Narrative  . No narrative on file    Review of Systems: Constitutional: Negative for fever, chills, diaphoresis, activity change, appetite change and fatigue. HENT: Negative for ear pain, nosebleeds, congestion, facial swelling, rhinorrhea, neck pain, neck stiffness and ear discharge.  Eyes: Negative for pain, discharge, redness, itching and visual disturbance. Respiratory: Negative for cough, choking, chest tightness, shortness of breath, wheezing and stridor.  Cardiovascular: Negative for chest pain, palpitations and leg swelling. Gastrointestinal: Negative for abdominal distention. Genitourinary: Negative for dysuria, urgency, frequency, hematuria, flank pain, decreased urine volume, difficulty urinating and dyspareunia.  Musculoskeletal: Negative for back pain, joint swelling, arthralgia and gait problem. Neurological: Negative for dizziness, tremors, seizures, syncope, facial asymmetry, speech difficulty, weakness, light-headedness, numbness and headaches.  Hematological: Negative for adenopathy. Does not bruise/bleed easily. Psychiatric/Behavioral: Negative for hallucinations, behavioral problems, confusion, dysphoric mood, decreased concentration and agitation.    Objective:   Filed Vitals:   01/02/14 0906  BP: 129/93  Pulse: 100  Temp: 98.4 F (36.9 C)  Resp: 16  Physical Exam: Constitutional: Patient appears well-developed and well-nourished. No distress. HENT: Normocephalic, atraumatic, External right and left ear normal. Oropharynx is clear and moist.  Eyes: Conjunctivae and EOM are normal. PERRLA, no scleral icterus. Neck: Normal ROM. Neck supple. No JVD. No tracheal deviation. No  thyromegaly. CVS: RRR, S1/S2 +, no murmurs, no gallops, no carotid bruit.  Pulmonary: Effort and breath sounds normal, no stridor, rhonchi, wheezes, rales.  Abdominal: Soft. BS +, no distension, tenderness, rebound or guarding.  Musculoskeletal: Normal range of motion. No edema and no tenderness.  Lymphadenopathy: No lymphadenopathy noted, cervical, inguinal or axillary Neuro: Alert. Normal reflexes, muscle tone coordination. No cranial nerve deficit. Skin: Skin is warm and dry. No rash noted. Not diaphoretic. No erythema. No pallor. Psychiatric: Normal mood and affect. Behavior, judgment, thought content normal.  Lab Results  Component Value Date   WBC 14.2* 10/09/2013   HGB 12.6 10/09/2013   HCT 39.2 10/09/2013   MCV 83.9 10/09/2013   PLT 316 10/09/2013   Lab Results  Component Value Date   CREATININE 0.72 10/09/2013   BUN 11 10/09/2013   NA 138 10/09/2013   K 4.1 10/09/2013   CL 101 10/09/2013   CO2 20 10/09/2013    No results found for this basename: HGBA1C   Lipid Panel  No results found for this basename: chol, trig, hdl, cholhdl, vldl, ldlcalc       Assessment and plan:   1. Muscle spasm  - MR Brain Wo Contrast; Future - cyclobenzaprine (FLEXERIL) 10 MG tablet; Take 1 tablet (10 mg total) by mouth 3 (three) times daily as needed for muscle spasms.  Dispense: 60 tablet; Refill: 0 - naproxen (NAPROSYN) 500 MG tablet; Take 1 tablet (500 mg total) by mouth 2 (two) times daily with a meal. X 5 days  Dispense: 30 tablet; Refill: 2  2. Major depression  - CBC with Differential - COMPLETE METABOLIC PANEL WITH GFR - POCT glycosylated hemoglobin (Hb A1C) - Lipid panel - TSH - Urinalysis, Complete  - FLUoxetine (PROZAC) 20 MG capsule; Take 1 capsule (20 mg total) by mouth daily.  Dispense: 30 capsule; Refill: 3  3. Multiple falls  - MR Brain Wo Contrast; Future  Patient was counseled extensively about nutrition and exercise. Patient was given an appointment with our social  worker here in the clinic for counseling and behavioral therapy  Return in about 3 months (around 04/04/2014), or if symptoms worsen or fail to improve, for Routine Follow Up, Follow up Pain and comorbidities.  The patient was given clear instructions to go to ER or return to medical center if symptoms don't improve, worsen or new problems develop. The patient verbalized understanding. The patient was told to call to get lab results if they haven't heard anything in the next week.     This note has been created with Surveyor, quantity. Any transcriptional errors are unintentional.    Angelica Chessman, MD, [place], [place], [place], [place]   01/02/2014, 10:00 AM

## 2014-01-02 NOTE — Progress Notes (Signed)
Pt is here to establish care. Pt reports injuring her back 3 weeks ago. Now she is in pain with muscle spasms. Pt states that 4 days ago she fell and injured her face. She blacked out and does not remember anything.

## 2014-01-03 LAB — URINALYSIS, COMPLETE
BACTERIA UA: NONE SEEN
Bilirubin Urine: NEGATIVE
CASTS: NONE SEEN
CRYSTALS: NONE SEEN
GLUCOSE, UA: NEGATIVE mg/dL
KETONES UR: NEGATIVE mg/dL
NITRITE: NEGATIVE
PH: 6.5 (ref 5.0–8.0)
Protein, ur: NEGATIVE mg/dL
SPECIFIC GRAVITY, URINE: 1.02 (ref 1.005–1.030)
Urobilinogen, UA: 0.2 mg/dL (ref 0.0–1.0)

## 2014-01-04 ENCOUNTER — Ambulatory Visit: Payer: No Typology Code available for payment source | Attending: Internal Medicine | Admitting: *Deleted

## 2014-01-04 DIAGNOSIS — F4323 Adjustment disorder with mixed anxiety and depressed mood: Secondary | ICD-10-CM

## 2014-01-04 NOTE — Progress Notes (Signed)
LCSW met with patient in order to begin working to address her issues with depression.  Patient identified several stressors in her life especially financial and family stresses.  LCSW encouraged patient to begin journaling process in order to express and identify challenges.  LCSW also encouraged patient to decrease drinking.  Patient was tearful but receptive of counseling and psychotherapy. Patient will return in about 1 week.  Christene Lye MSW, LCSW Duration 60 minutes

## 2014-01-10 ENCOUNTER — Other Ambulatory Visit: Payer: Self-pay

## 2014-01-10 ENCOUNTER — Telehealth: Payer: Self-pay | Admitting: Emergency Medicine

## 2014-01-10 MED ORDER — GEMFIBROZIL 600 MG PO TABS: 600.0000 mg | ORAL_TABLET | Freq: Two times a day (BID) | ORAL | Status: DC

## 2014-01-10 MED ORDER — CIPROFLOXACIN HCL 500 MG PO TABS: 500.0000 mg | ORAL_TABLET | Freq: Two times a day (BID) | ORAL | Status: DC

## 2014-01-10 NOTE — Telephone Encounter (Signed)
Left message for pt to call when message received. Medication ordered and sent to [facility]

## 2014-01-10 NOTE — Telephone Encounter (Signed)
Attempted to reach pt again #2 to give medication instructions. Both numbers listed has no voicemail set up

## 2014-01-10 NOTE — Telephone Encounter (Signed)
Message copied by Ricci Barker on Tue Jan 10, 2014  8:43 AM ------      Message from: Tresa Garter      Created: Sun Jan 08, 2014  5:35 PM       Please inform patient that her laboratory tests results shows evidence of urinary tract infection, and her triglyceride, a form of cholesterol, is very high. We need to treat this with medication.            Please call in ciprofloxacin 500 mg tablet by mouth twice a day for 5 days      Please call in gemfibrozil 600 mg tablet by mouth twice a day, 180 tablets with 3 refills. ------

## 2014-01-11 ENCOUNTER — Ambulatory Visit: Payer: No Typology Code available for payment source | Attending: Internal Medicine | Admitting: *Deleted

## 2014-01-11 DIAGNOSIS — F3289 Other specified depressive episodes: Secondary | ICD-10-CM

## 2014-01-11 DIAGNOSIS — F329 Major depressive disorder, single episode, unspecified: Secondary | ICD-10-CM

## 2014-01-11 NOTE — Progress Notes (Signed)
LCSW met with patient who stated that she was having challenges with managing her mood.  LCSW encouraged patient to take her prozac as prescribed and that psychotropic medications take about 2-4 weeks to show an impact.  LCSW also supported patient in developing a better system of self care.  LCSW also encouraged patient to go to [facility] or [place] in order to get psychiatry in place to adjust her current medication.  LCSW also conducted a PHQ9 Her original score was 7 on previous assessment but was a 19 today.  Patient is very tearful and expresses a depressed mood regularly.  LCSW encouraged patient to follow up with this LCSW in order to assess further needs in 1 week.  Christene Lye MSW, LCSW Duration 45 minutes

## 2014-01-18 ENCOUNTER — Ambulatory Visit: Payer: No Typology Code available for payment source | Attending: Internal Medicine | Admitting: *Deleted

## 2014-01-18 DIAGNOSIS — F3289 Other specified depressive episodes: Secondary | ICD-10-CM

## 2014-01-18 DIAGNOSIS — F329 Major depressive disorder, single episode, unspecified: Secondary | ICD-10-CM

## 2014-01-18 NOTE — Progress Notes (Signed)
LCSW met with patient who stated that she was feeling better since getting her medications managed at [facility]. Patient stated that she has goals of improving family and partner relationships.  LCSW processed with patient a plan in order to develop better relationship and improve connection within the family.  Patient will return in 1 week.  Christene Lye MSW, LCSW Duration 60 minutes

## 2014-01-24 ENCOUNTER — Other Ambulatory Visit: Payer: No Typology Code available for payment source

## 2014-01-25 ENCOUNTER — Ambulatory Visit (HOSPITAL_COMMUNITY)
Admission: RE | Admit: 2014-01-25 | Discharge: 2014-01-25 | Disposition: A | Discharge status: Home / Self Care | Payer: No Typology Code available for payment source | Source: Ambulatory Visit | Attending: Internal Medicine | Admitting: Internal Medicine

## 2014-01-25 DIAGNOSIS — M62838 Other muscle spasm: Secondary | ICD-10-CM

## 2014-01-25 DIAGNOSIS — J3489 Other specified disorders of nose and nasal sinuses: Secondary | ICD-10-CM | POA: Insufficient documentation

## 2014-01-25 DIAGNOSIS — R296 Repeated falls: Secondary | ICD-10-CM

## 2014-01-25 DIAGNOSIS — R55 Syncope and collapse: Secondary | ICD-10-CM | POA: Insufficient documentation

## 2014-01-26 ENCOUNTER — Ambulatory Visit: Payer: No Typology Code available for payment source | Attending: Internal Medicine | Admitting: *Deleted

## 2014-01-26 ENCOUNTER — Telehealth: Payer: Self-pay | Admitting: *Deleted

## 2014-01-26 DIAGNOSIS — F4323 Adjustment disorder with mixed anxiety and depressed mood: Secondary | ICD-10-CM

## 2014-01-26 NOTE — Progress Notes (Signed)
LCSW met with patient who identified that she had been dealing with several stressors at this time.  Patient identified these stressors and LCSW supported patient in identifying successes through the past few weeks.  LCSW also supported patient in compartmentalizing the challenges and developing new perspective and action plans to support movement toward goals.  Patient was able to realign her perspective to improve mood.  Patient also needs referral for [facility]  LCSW will follow up on this.  Christene Lye MSW, LCSW Duration 60 minutes

## 2014-01-26 NOTE — Telephone Encounter (Signed)
Message copied by Joan Mayans on Thu Jan 26, 2014 10:23 AM ------      Message from: Elizabeth Dean E      Created: Wed Jan 25, 2014 11:48 AM       Please inform patient that her brain MRI is normal, there is an incidental finding of retention cysts in the left maxillary sinus ------

## 2014-01-26 NOTE — Telephone Encounter (Signed)
I spoke to the pt and I informed the pt of her MRI results.

## 2014-01-27 ENCOUNTER — Telehealth: Payer: Self-pay | Admitting: *Deleted

## 2014-01-27 ENCOUNTER — Telehealth: Payer: Self-pay | Admitting: Internal Medicine

## 2014-01-27 ENCOUNTER — Other Ambulatory Visit: Payer: Self-pay | Admitting: *Deleted

## 2014-01-27 DIAGNOSIS — M62838 Other muscle spasm: Secondary | ICD-10-CM

## 2014-01-27 MED ORDER — MELOXICAM 15 MG PO TABS: 15.0000 mg | ORAL_TABLET | Freq: Every day | ORAL | Status: DC

## 2014-01-27 MED ORDER — CYCLOBENZAPRINE HCL 10 MG PO TABS: 10.0000 mg | ORAL_TABLET | Freq: Three times a day (TID) | ORAL | Status: DC | PRN

## 2014-01-27 NOTE — Telephone Encounter (Signed)
Pt received new medications

## 2014-01-27 NOTE — Progress Notes (Signed)
Pt requested another medication other than the naproxen and a refill for her flexeril. Dr. Doreene Burke prescribed Mobic and refilled her flexeril.

## 2014-01-27 NOTE — Telephone Encounter (Signed)
Pt says she has not received script refills yet, pls f/u with pt.

## 2014-01-27 NOTE — Telephone Encounter (Signed)
Returned patient's call. Patient was available. Left a message for patient to return call.

## 2014-01-31 ENCOUNTER — Other Ambulatory Visit: Payer: No Typology Code available for payment source

## 2014-02-01 ENCOUNTER — Other Ambulatory Visit: Payer: Self-pay | Admitting: Internal Medicine

## 2014-02-02 ENCOUNTER — Emergency Department (HOSPITAL_COMMUNITY)
Admission: EM | Admit: 2014-02-02 | Discharge: 2014-02-02 | Disposition: A | Discharge status: Home / Self Care | Payer: No Typology Code available for payment source | Attending: Emergency Medicine | Admitting: Emergency Medicine

## 2014-02-02 ENCOUNTER — Encounter (HOSPITAL_COMMUNITY): Payer: Self-pay | Admitting: Emergency Medicine

## 2014-02-02 ENCOUNTER — Other Ambulatory Visit: Payer: No Typology Code available for payment source

## 2014-02-02 ENCOUNTER — Ambulatory Visit: Payer: No Typology Code available for payment source | Attending: Internal Medicine | Admitting: *Deleted

## 2014-02-02 DIAGNOSIS — J45909 Unspecified asthma, uncomplicated: Secondary | ICD-10-CM | POA: Insufficient documentation

## 2014-02-02 DIAGNOSIS — K029 Dental caries, unspecified: Secondary | ICD-10-CM

## 2014-02-02 DIAGNOSIS — F17200 Nicotine dependence, unspecified, uncomplicated: Secondary | ICD-10-CM | POA: Insufficient documentation

## 2014-02-02 DIAGNOSIS — F4323 Adjustment disorder with mixed anxiety and depressed mood: Secondary | ICD-10-CM

## 2014-02-02 DIAGNOSIS — Z79899 Other long term (current) drug therapy: Secondary | ICD-10-CM | POA: Insufficient documentation

## 2014-02-02 DIAGNOSIS — K047 Periapical abscess without sinus: Secondary | ICD-10-CM

## 2014-02-02 DIAGNOSIS — Z791 Long term (current) use of non-steroidal anti-inflammatories (NSAID): Secondary | ICD-10-CM | POA: Insufficient documentation

## 2014-02-02 DIAGNOSIS — F411 Generalized anxiety disorder: Secondary | ICD-10-CM | POA: Insufficient documentation

## 2014-02-02 DIAGNOSIS — Z792 Long term (current) use of antibiotics: Secondary | ICD-10-CM | POA: Insufficient documentation

## 2014-02-02 DIAGNOSIS — K089 Disorder of teeth and supporting structures, unspecified: Secondary | ICD-10-CM | POA: Insufficient documentation

## 2014-02-02 HISTORY — DX: Anxiety disorder, unspecified: F41.9

## 2014-02-02 HISTORY — DX: Panic disorder (episodic paroxysmal anxiety): F41.0

## 2014-02-02 MED ORDER — ACETAMINOPHEN-CODEINE #3 300-30 MG PO TABS: 1.0000 | ORAL_TABLET | Freq: Four times a day (QID) | ORAL | Status: DC | PRN

## 2014-02-02 MED ORDER — CLINDAMYCIN HCL 150 MG PO CAPS: 150.0000 mg | ORAL_CAPSULE | Freq: Four times a day (QID) | ORAL | Status: DC

## 2014-02-02 MED ORDER — TRAMADOL HCL 50 MG PO TABS: 50.0000 mg | ORAL_TABLET | Freq: Four times a day (QID) | ORAL | Status: DC | PRN

## 2014-02-02 NOTE — Progress Notes (Signed)
Patient presented for a same day appointment with LCSW in order to begin processing challenges that she has been dealing with in the past week.  Patient stated that she feels like she is in crisis and that everything is falling a part.  LCSW processed with patient areas of growth and progress that she is also experiencing in addition to the challenges that she is facing.  LCSW directed patient through a process of reframing in order to understand the balance and the benefits of her current situation.  LCSW also worked with pharmacy to coordinate medication needs and provided the patient with bus passes for transportation support.  Christene Lye MSW, LCSW Duration 60 minutes

## 2014-02-02 NOTE — ED Provider Notes (Signed)
Medical screening examination/treatment/procedure(s) were performed by non-physician practitioner and as supervising physician I was immediately available for consultation/collaboration.   EKG Interpretation None        Ezequiel Essex, MD 02/02/14 1625

## 2014-02-02 NOTE — ED Provider Notes (Signed)
CSN: [id number]     Arrival date & time 02/02/14  0902 History  This chart was scribed for non-physician practitioner Noland Fordyce, working with Ezequiel Essex, MD by Donato Schultz, ED Scribe. This patient was seen in room TR07C/TR07C and the patient's care was started at 9:20 AM.    Chief Complaint  Patient presents with  . Dental Pain   The history is provided by the patient. No language interpreter was used.   HPI Comments: Elizabeth Dean is a 33 y.o. female who presents to the Emergency Department complaining of constant left lower dental pain with associated left sided facial swelling due to an abscess that started 2-3 days ago.  She rates the pain at a 7/10.  She states that she has taken Tylenol and Ibuprofen with no relief to her symptoms.  The patient denies fever, nausea and vomiting as associated symptoms.  The patient states that she had a CT scan performed by Dr. Doreene Burke four days ago that revealed an oral abscess.  She states that her PCP told her to report to the ED for treatment.  The patient states that she has had the abscess drained in the past but it keeps returning.  She denies being allergic to antibiotics.     Past Medical History  Diagnosis Date  . Asthma   . Bronchitis   . Anxiety   . Panic attacks    Past Surgical History  Procedure Laterality Date  . Tubal ligation    . Cholecystectomy     Family History  Problem Relation Age of Onset  . Heart disease Maternal Grandmother   . Hypertension Maternal Grandmother    History  Substance Use Topics  . Smoking status: Light Tobacco Smoker -- 0.50 packs/day    Types: Cigarettes  . Smokeless tobacco: Not on file  . Alcohol Use: 1.2 oz/week    2 Cans of beer per week   OB History   Grav Para Term Preterm Abortions TAB SAB Ect Mult Living                 Review of Systems  Constitutional: Negative for fever.  HENT: Positive for dental problem and facial swelling.   Gastrointestinal: Negative for nausea  and vomiting.  All other systems reviewed and are negative.     Allergies  Hydrocodone  Home Medications   Prior to Admission medications   Medication Sig Start Date End Date Taking? Authorizing Provider  acetaminophen (TYLENOL) 500 MG tablet Take 1,000 mg by mouth 2 (two) times daily as needed for moderate pain.    Historical Provider, MD  albuterol (PROVENTIL HFA;VENTOLIN HFA) 108 (90 BASE) MCG/ACT inhaler Inhale 2 puffs into the lungs every 4 (four) hours as needed for wheezing or shortness of breath. 10/08/13   Mirna Mires, MD  ciprofloxacin (CIPRO) 500 MG tablet Take 1 tablet (500 mg total) by mouth 2 (two) times daily. 01/10/14   Angelica Chessman, MD  clindamycin (CLEOCIN) 150 MG capsule Take 1 capsule (150 mg total) by mouth every 6 (six) hours. 02/02/14   Noland Fordyce, PA-C  cyclobenzaprine (FLEXERIL) 10 MG tablet Take 1 tablet (10 mg total) by mouth 3 (three) times daily as needed for muscle spasms. 01/27/14   Angelica Chessman, MD  FLUoxetine (PROZAC) 20 MG capsule Take 1 capsule (20 mg total) by mouth daily. 01/02/14   Angelica Chessman, MD  gemfibrozil (LOPID) 600 MG tablet Take 1 tablet (600 mg total) by mouth 2 (two) times daily before a  meal. 01/10/14   Angelica Chessman, MD  meloxicam (MOBIC) 15 MG tablet Take 1 tablet (15 mg total) by mouth daily. 01/27/14   Angelica Chessman, MD  naproxen (NAPROSYN) 500 MG tablet Take 1 tablet (500 mg total) by mouth 2 (two) times daily with a meal. X 5 days 01/02/14   Angelica Chessman, MD  traMADol (ULTRAM) 50 MG tablet Take 1 tablet (50 mg total) by mouth every 6 (six) hours as needed. 02/02/14   Noland Fordyce, PA-C   Triage Vitals: BP 139/86  Pulse 124  Temp(Src) 97.9 F (36.6 C) (Oral)  Resp 16  SpO2 98%  LMP 01/30/2014  Physical Exam  Nursing note and vitals reviewed. Constitutional: She is oriented to person, place, and time. She appears well-developed and well-nourished.  HENT:  Head: Normocephalic and atraumatic.   Mouth/Throat: Uvula is midline, oropharynx is clear and moist and mucous membranes are normal. No oropharyngeal exudate, posterior oropharyngeal edema, posterior oropharyngeal erythema or tonsillar abscesses.  Dentition is normal.  Pea-sized palpable mass under her left jaw.  Eyes: EOM are normal.  Neck: Normal range of motion.  Cardiovascular: Normal rate.   Pulmonary/Chest: Effort normal.  Musculoskeletal: Normal range of motion.  Neurological: She is alert and oriented to person, place, and time.  Skin: Skin is warm and dry.  Psychiatric: She has a normal mood and affect. Her behavior is normal.    ED Course  Procedures (including critical care time)  DIAGNOSTIC STUDIES: Oxygen Saturation is 98% on room air, normal by my interpretation.    COORDINATION OF CARE: 9:22 AM- Discussed discharging the patient with an antibiotic and a referral to a dentist for further treatment.  The patient agreed to the treatment plan.    Labs Review Labs Reviewed - No data to display  Imaging Review No results found.   EKG Interpretation None      MDM   Final diagnoses:  Pain due to dental caries  Dental abscess    Pt is a 33yo female presenting to ED with c/o left lower dental pain and reports of dental abscess seen on imaging performed by her PCP about 4 days ago.  No obvious dental abscess seen on exam. No obvious dental caries or cracked teeth.  Pea-sized mass within soft tissue of left lower jaw.  Probable abscess that was seen on reported exam.  Imaging not visible through EMR at this time.  Will place pt on clindamycin and advised pt to call to schedule f/u appointment with Dr. Junita Push, [place], as well as provided dental resource guide for further evaluation and treatment of abscess. Return precautions provided. Pt verbalized understanding and agreement with tx plan.   I personally performed the services described in this documentation, which was scribed in my presence. The recorded  information has been reviewed and is accurate.    Noland Fordyce, PA-C 02/02/14 [phone]

## 2014-02-02 NOTE — ED Notes (Signed)
Pt comfortable with discharge and follow up instructions. Prescriptions x2. Pt declines wheelchair, escorted to waiting area.

## 2014-02-02 NOTE — Discharge Instructions (Signed)
Emergency Department Resource Guide 1) Find a Doctor and Pay Out of Pocket Although you won't have to find out who is covered by your insurance plan, it is a good idea to ask around and get recommendations. You will then need to call the office and see if the doctor you have chosen will accept you as a new patient and what types of options they offer for patients who are self-pay. Some doctors offer discounts or will set up payment plans for their patients who do not have insurance, but you will need to ask so you aren't surprised when you get to your appointment.  2) Contact Your Local Health Department Not all health departments have doctors that can see patients for sick visits, but many do, so it is worth a call to see if yours does. If you don't know where your local health department is, you can check in your phone book. The CDC also has a tool to help you locate your state's health department, and many state websites also have listings of all of their local health departments.  3) Find a [facility] If your illness is not likely to be very severe or complicated, you may want to try a walk in clinic. These are popping up all over the country in pharmacies, drugstores, and shopping centers. They're usually staffed by nurse practitioners or physician assistants that have been trained to treat common illnesses and complaints. They're usually fairly quick and inexpensive. However, if you have serious medical issues or chronic medical problems, these are probably not your best option.  No Primary Care Doctor: - Call Health Connect at  [phone] - they can help you locate a primary care doctor that  accepts your insurance, provides certain services, etc. - Physician Referral Service- [phone]  Chronic Pain Problems: Organization         Address  Phone   Notes  [facility]  [phone] Patients need to be referred by their primary care doctor.   Medication  Assistance: Organization         Address  Phone   Notes  [facility] Medication [facility] [address] [place], [place] [address] [phone] --Must be a resident of [facility] -- Must have NO insurance coverage whatsoever (no Medicaid/ Medicare, etc.) -- The pt. MUST have a primary care doctor that directs their care regularly and follows them in the community   [facility]  [phone]   [employer]  [phone]    Agencies that provide inexpensive medical care: Organization         Address                                                       Phone                                                                            Notes  [place]  [phone]   [facility]    [phone])  [facility] [place], [place] [address] [phone]   [place]. [address], [place] [phone]   [facility]    [phone]   [facility]    [phone]   [place] and [place] [address], [place] Phone:  [phone], Fax:  [phone] Hours of Operation:  9 am - 6 pm, M-F.  Also accepts Medicaid/Medicare and self-pay.  [facility] for [address] [place], [address], [place] Phone: [phone], Fax: [phone]. Hours of Operation:  8:30 am - 5:30 pm, M-F.  Also accepts Medicaid and self-pay.  [facility] [address], [place] Phone: [phone]   [place], [place], [place] [phone], Ext. [phone] Mondays & Thursdays: 7-9 AM.  First 15 patients are seen on a first come, first serve basis.    [place] Providers:  Organization         Address                                                                       Phone                               Notes  [place] [facility] [address], [place] [phone] Also accepts self-pay patients.  [facility] [address], [place]  [phone]   [place], [address], [place] [phone]   [facility] [address] [place] [phone]   Lucianne Lei [address], [place]   [phone] Only accepts [place] Access [place] patients after they have their name applied to their card.   Self-Pay (no insurance) in [facility]:   Organization         Address                                                     Phone               Notes  Sickle Cell Patients, [facility] [place] [phone]   [facility] [place] [phone]   [facility] [place]  [place], [place], [place] [phone]   [facility]/Dr. Osei-Bonsu  [address] [place] or [address], [place] [phone] Phone number for both [place] and [place] locations is the same.  [facility] [address] [place] [phone]   [facility] [address] [place] or [address] [phone]   [facility] [place], [place] [phone], phone; [phone], fax Sees patients 1st and 3rd Saturday of  every month.  Must not qualify for public or private insurance (i.e. Medicaid, Medicare, [place], Veterans' Benefits)  Household income should be no more than 200% of the poverty level The clinic cannot treat you if you are pregnant or think you are pregnant  Sexually transmitted diseases are not treated at the clinic.   _____________Dental Care:______________ [employer]         Address                                  Phone                       Notes  [facility] [place] [phone] Accepts children up to age 25 who are enrolled in [place] or [place]; pregnant women with a Medicaid card; and children who have applied for Medicaid or [place] Health Choice, but were declined, whose parents can pay a reduced fee at time of service.  [facility]  [address], [place] [phone] Accepts children up to age 104 who are enrolled in [place] or [place]; pregnant women with a Medicaid card; and children who have applied for Medicaid or  Health Choice, but were declined, whose parents can pay a reduced fee at time of service.  [place] [facility]  [place] [phone] Patients are seen by appointment only. Walk-ins are not accepted. [place] will see patients 96 years of age and older. Monday - Tuesday (8am-5pm) Most Wednesdays (8:30-5pm) $30 per visit, cash only  [facility] Adult [facility] Access [facility]  [address], [facility] [phone] Patients are seen by appointment only. Walk-ins are not accepted. [place] will see patients 55 years of age and older. One Wednesday Evening (Monthly: Volunteer Based).  $30 per visit, cash only  [place]  [phone] for adults; Children under age 14, call Graduate Pediatric Dentistry at [phone]. Children aged 70-14, please call [phone] to request a pediatric application.  Dental services are provided in all areas of dental care including fillings, crowns and bridges, complete and partial dentures, implants, gum treatment, root canals, and extractions. Preventive care is also provided. Treatment is provided to both adults and children. Patients are selected via a lottery and there is often a waiting list.   [facility] [address] [place]  [phone]  [email or web address]   [facility] [address] [place], [place] [phone], Ext. [phone] Second and Fourth Thursday of each month, opens at 6:30 AM; Clinic ends at 9 AM.  Patients are seen on a first-come first-served basis, and a limited number are seen during each clinic.   [facility]  [address] [place], [place] [phone]   Eligibility Requirements You must have lived in [place], [place], or [place] counties for at least the last three months.   You cannot be eligible for state or federal sponsored [employer], including [employer], [place], or [employer].   You generally cannot be eligible for healthcare insurance through your employer.    How to apply: Eligibility screenings are held every Tuesday and Wednesday afternoon from 1:00 pm until 4:00 pm. You do not need an appointment for the interview!  [facility] [address] [place], [place]   [place]  [place]  [place]  [phone]

## 2014-02-02 NOTE — ED Notes (Signed)
Pt presents via POV from home with c/o left lower dental pain, pt reports her doctor sent her here for evaluation after noticing a "bump" to her left lower molar area on a "scan". Pt reports pain to the area x4 days, worse with sleep.

## 2014-02-05 ENCOUNTER — Encounter (HOSPITAL_COMMUNITY): Payer: Self-pay | Admitting: Emergency Medicine

## 2014-02-05 ENCOUNTER — Emergency Department (HOSPITAL_COMMUNITY)
Admission: EM | Admit: 2014-02-05 | Discharge: 2014-02-05 | Disposition: A | Discharge status: Home / Self Care | Payer: No Typology Code available for payment source | Attending: Emergency Medicine | Admitting: Emergency Medicine

## 2014-02-05 DIAGNOSIS — J4489 Other specified chronic obstructive pulmonary disease: Secondary | ICD-10-CM | POA: Insufficient documentation

## 2014-02-05 DIAGNOSIS — F17200 Nicotine dependence, unspecified, uncomplicated: Secondary | ICD-10-CM | POA: Insufficient documentation

## 2014-02-05 DIAGNOSIS — J449 Chronic obstructive pulmonary disease, unspecified: Secondary | ICD-10-CM | POA: Insufficient documentation

## 2014-02-05 DIAGNOSIS — F410 Panic disorder [episodic paroxysmal anxiety] without agoraphobia: Secondary | ICD-10-CM | POA: Insufficient documentation

## 2014-02-05 DIAGNOSIS — Z792 Long term (current) use of antibiotics: Secondary | ICD-10-CM | POA: Insufficient documentation

## 2014-02-05 DIAGNOSIS — K0889 Other specified disorders of teeth and supporting structures: Secondary | ICD-10-CM

## 2014-02-05 DIAGNOSIS — K219 Gastro-esophageal reflux disease without esophagitis: Secondary | ICD-10-CM | POA: Insufficient documentation

## 2014-02-05 DIAGNOSIS — K089 Disorder of teeth and supporting structures, unspecified: Secondary | ICD-10-CM | POA: Insufficient documentation

## 2014-02-05 DIAGNOSIS — Z79899 Other long term (current) drug therapy: Secondary | ICD-10-CM | POA: Insufficient documentation

## 2014-02-05 MED ORDER — OMEPRAZOLE 20 MG PO CPDR: 40.0000 mg | DELAYED_RELEASE_CAPSULE | Freq: Every day | ORAL | Status: DC

## 2014-02-05 MED ORDER — PANTOPRAZOLE SODIUM 40 MG PO TBEC
40.0000 mg | DELAYED_RELEASE_TABLET | Freq: Once | ORAL | Status: AC
  Administered 2014-02-05: 40 mg via ORAL
  Filled 2014-02-05: qty 1

## 2014-02-05 MED ORDER — GI COCKTAIL ~~LOC~~
30.0000 mL | Freq: Once | ORAL | Status: AC
  Administered 2014-02-05: 30 mL via ORAL
  Filled 2014-02-05: qty 30

## 2014-02-05 MED ORDER — ONDANSETRON 4 MG PO TBDP
4.0000 mg | ORAL_TABLET | Freq: Once | ORAL | Status: AC
  Administered 2014-02-05: 4 mg via ORAL
  Filled 2014-02-05: qty 1

## 2014-02-05 NOTE — ED Notes (Signed)
The pt is c/o a toothache for 4 days and she is c/o mid-chest pain since yesterday no sob no nausea.  She has ben seen here for the toothache and she had a reaction to the antibiotic she was given

## 2014-02-05 NOTE — Discharge Instructions (Signed)
Dental Pain A tooth ache may be caused by cavities (tooth decay). Cavities expose the nerve of the tooth to air and hot or cold temperatures. It may come from an infection or abscess (also called a boil or furuncle) around your tooth. It is also often caused by dental caries (tooth decay). This causes the pain you are having. DIAGNOSIS  Your caregiver can diagnose this problem by exam. TREATMENT   If caused by an infection, it may be treated with medications which kill germs (antibiotics) and pain medications as prescribed by your caregiver. Take medications as directed.  Only take over-the-counter or prescription medicines for pain, discomfort, or fever as directed by your caregiver.  Whether the tooth ache today is caused by infection or dental disease, you should see your dentist as soon as possible for further care. SEEK MEDICAL CARE IF: The exam and treatment you received today has been provided on an emergency basis only. This is not a substitute for complete medical or dental care. If your problem worsens or new problems (symptoms) appear, and you are unable to meet with your dentist, call or return to this location. SEEK IMMEDIATE MEDICAL CARE IF:   You have a fever.  You develop redness and swelling of your face, jaw, or neck.  You are unable to open your mouth.  You have severe pain uncontrolled by pain medicine. MAKE SURE YOU:   Understand these instructions.  Will watch your condition.  Will get help right away if you are not doing well or get worse. Document Released: 07/07/2005 Document Revised: 09/29/2011 Document Reviewed: 02/23/2008 [facility] Patient Information 2015 [place], [place]. This information is not intended to replace advice given to you by your health care provider. Make sure you discuss any questions you have with your health care provider.  Food Choices for Gastroesophageal Reflux Disease When you have gastroesophageal reflux disease (GERD), the foods you  eat and your eating habits are very important. Choosing the right foods can help ease the discomfort of GERD. WHAT GENERAL GUIDELINES DO I NEED TO FOLLOW?  Choose fruits, vegetables, whole grains, low-fat dairy products, and low-fat meat, fish, and poultry.  Limit fats such as oils, salad dressings, butter, nuts, and avocado.  Keep a food diary to identify foods that cause symptoms.  Avoid foods that cause reflux. These may be different for different people.  Eat frequent small meals instead of three large meals each day.  Eat your meals slowly, in a relaxed setting.  Limit fried foods.  Cook foods using methods other than frying.  Avoid drinking alcohol.  Avoid drinking large amounts of liquids with your meals.  Avoid bending over or lying down until 2-3 hours after eating. WHAT FOODS ARE NOT RECOMMENDED? The following are some foods and drinks that may worsen your symptoms: Vegetables Tomatoes. Tomato juice. Tomato and spaghetti sauce. Chili peppers. Onion and garlic. Horseradish. Fruits Oranges, grapefruit, and lemon (fruit and juice). Meats High-fat meats, fish, and poultry. This includes hot dogs, ribs, ham, sausage, salami, and bacon. Dairy Whole milk and chocolate milk. Sour cream. Cream. Butter. Ice cream. Cream cheese.  Beverages Coffee and tea, with or without caffeine. Carbonated beverages or energy drinks. Condiments Hot sauce. Barbecue sauce.  Sweets/Desserts Chocolate and cocoa. Donuts. Peppermint and spearmint. Fats and Oils High-fat foods, including [place] fries and potato chips. Other Vinegar. Strong spices, such as black pepper, white pepper, red pepper, cayenne, curry powder, cloves, ginger, and chili powder. The items listed above may not be a complete  list of foods and beverages to avoid. Contact your dietitian for more information. Document Released: 07/07/2005 Document Revised: 07/12/2013 Document Reviewed: 05/11/2013 [facility] Patient Information  2015 [place], [place]. This information is not intended to replace advice given to you by your health care provider. Make sure you discuss any questions you have with your health care provider.  Gastroesophageal Reflux Disease, Adult Gastroesophageal reflux disease (GERD) happens when acid from your stomach flows up into the esophagus. When acid comes in contact with the esophagus, the acid causes soreness (inflammation) in the esophagus. Over time, GERD may create small holes (ulcers) in the lining of the esophagus. CAUSES   Increased body weight. This puts pressure on the stomach, making acid rise from the stomach into the esophagus.  Smoking. This increases acid production in the stomach.  Drinking alcohol. This causes decreased pressure in the lower esophageal sphincter (valve or ring of muscle between the esophagus and stomach), allowing acid from the stomach into the esophagus.  Late evening meals and a full stomach. This increases pressure and acid production in the stomach.  A malformed lower esophageal sphincter. Sometimes, no cause is found. SYMPTOMS   Burning pain in the lower part of the mid-chest behind the breastbone and in the mid-stomach area. This may occur twice a week or more often.  Trouble swallowing.  Sore throat.  Dry cough.  Asthma-like symptoms including chest tightness, shortness of breath, or wheezing. DIAGNOSIS  Your caregiver may be able to diagnose GERD based on your symptoms. In some cases, X-rays and other tests may be done to check for complications or to check the condition of your stomach and esophagus. TREATMENT  Your caregiver may recommend over-the-counter or prescription medicines to help decrease acid production. Ask your caregiver before starting or adding any new medicines.  HOME CARE INSTRUCTIONS   Change the factors that you can control. Ask your caregiver for guidance concerning weight loss, quitting smoking, and alcohol  consumption.  Avoid foods and drinks that make your symptoms worse, such as:  Caffeine or alcoholic drinks.  Chocolate.  Peppermint or mint flavorings.  Garlic and onions.  Spicy foods.  Citrus fruits, such as oranges, lemons, or limes.  Tomato-based foods such as sauce, chili, salsa, and pizza.  Fried and fatty foods.  Avoid lying down for the 3 hours prior to your bedtime or prior to taking a nap.  Eat small, frequent meals instead of large meals.  Wear loose-fitting clothing. Do not wear anything tight around your waist that causes pressure on your stomach.  Raise the head of your bed 6 to 8 inches with wood blocks to help you sleep. Extra pillows will not help.  Only take over-the-counter or prescription medicines for pain, discomfort, or fever as directed by your caregiver.  Do not take aspirin, ibuprofen, or other nonsteroidal anti-inflammatory drugs (NSAIDs). SEEK IMMEDIATE MEDICAL CARE IF:   You have pain in your arms, neck, jaw, teeth, or back.  Your pain increases or changes in intensity or duration.  You develop nausea, vomiting, or sweating (diaphoresis).  You develop shortness of breath, or you faint.  Your vomit is green, yellow, black, or looks like coffee grounds or blood.  Your stool is red, bloody, or black. These symptoms could be signs of other problems, such as heart disease, gastric bleeding, or esophageal bleeding. MAKE SURE YOU:   Understand these instructions.  Will watch your condition.  Will get help right away if you are not doing well or get worse. Document  Released: 04/16/2005 Document Revised: 09/29/2011 Document Reviewed: 01/24/2011 [facility] Patient Information 2015 [place], [place]. This information is not intended to replace advice given to you by your health care provider. Make sure you discuss any questions you have with your health care provider.

## 2014-02-05 NOTE — ED Provider Notes (Signed)
TIME SEEN: 10:27 PM  CHIEF COMPLAINT: Dental pain, indigestion  HPI: Patient is a 33 year old female with history of asthma, anxiety who presents the emergency department with complaints of indigestion for the past several days. She states she's had this problem intermittently it is worse after eating food. She does have a bitter, sour wording taste in her mouth. No shortness of breath. No nausea or vomiting. No diarrhea. No bloody stool or melena. Denies heavy NSAID use or alcohol use. She is status post cholecystectomy. No abdominal pain. No history of PE or DVT, recent prolonged immobilization such as long flight or hospitalization, fracture, surgery, trauma, exogenous hormone use.  She is also complaining of dental pain. No obvious abscess. She is on clindamycin prophylactically and has an appointment with a dentist tomorrow. No facial swelling. No trismus or drooling.  ROS: See HPI Constitutional: no fever  Eyes: no drainage  ENT: no runny nose   Cardiovascular:  no chest pain  Resp: no SOB  GI: no vomiting GU: no dysuria Integumentary: no rash  Allergy: no hives  Musculoskeletal: no leg swelling  Neurological: no slurred speech ROS otherwise negative  PAST MEDICAL HISTORY/PAST SURGICAL HISTORY:  Past Medical History  Diagnosis Date  . Asthma   . Bronchitis   . Anxiety   . Panic attacks     MEDICATIONS:  Prior to Admission medications   Medication Sig Start Date End Date Taking? Authorizing Provider  acetaminophen (TYLENOL) 500 MG tablet Take 1,000 mg by mouth 2 (two) times daily as needed for moderate pain.    Historical Provider, MD  acetaminophen-codeine (TYLENOL #3) 300-30 MG per tablet Take 1-2 tablets by mouth every 6 (six) hours as needed for moderate pain. 02/02/14   Noland Fordyce, PA-C  albuterol (PROVENTIL HFA;VENTOLIN HFA) 108 (90 BASE) MCG/ACT inhaler Inhale 2 puffs into the lungs every 4 (four) hours as needed for wheezing or shortness of breath. 10/08/13   Mirna Mires, MD  ciprofloxacin (CIPRO) 500 MG tablet Take 1 tablet (500 mg total) by mouth 2 (two) times daily. 01/10/14   Angelica Chessman, MD  clindamycin (CLEOCIN) 150 MG capsule Take 1 capsule (150 mg total) by mouth every 6 (six) hours. 02/02/14   Noland Fordyce, PA-C  cyclobenzaprine (FLEXERIL) 10 MG tablet Take 1 tablet (10 mg total) by mouth 3 (three) times daily as needed for muscle spasms. 01/27/14   Angelica Chessman, MD  FLUoxetine (PROZAC) 20 MG capsule Take 1 capsule (20 mg total) by mouth daily. 01/02/14   Angelica Chessman, MD  gemfibrozil (LOPID) 600 MG tablet Take 1 tablet (600 mg total) by mouth 2 (two) times daily before a meal. 01/10/14   Angelica Chessman, MD  meloxicam (MOBIC) 15 MG tablet Take 1 tablet (15 mg total) by mouth daily. 01/27/14   Angelica Chessman, MD  naproxen (NAPROSYN) 500 MG tablet Take 1 tablet (500 mg total) by mouth 2 (two) times daily with a meal. X 5 days 01/02/14   Angelica Chessman, MD  traMADol (ULTRAM) 50 MG tablet Take 1 tablet (50 mg total) by mouth every 6 (six) hours as needed. 02/02/14   Noland Fordyce, PA-C    ALLERGIES:  Allergies  Allergen Reactions  . Hydrocodone Other (See Comments)    [place] gallbladder    SOCIAL HISTORY:  History  Substance Use Topics  . Smoking status: Light Tobacco Smoker -- 0.50 packs/day    Types: Cigarettes  . Smokeless tobacco: Not on file  . Alcohol Use: 1.2 oz/week  2 Cans of beer per week    FAMILY HISTORY: Family History  Problem Relation Age of Onset  . Heart disease Maternal Grandmother   . Hypertension Maternal Grandmother     EXAM: BP 116/80  Pulse 106  Temp(Src) 98.1 F (36.7 C) (Oral)  Resp 18  Ht 4\' 9"  (1.448 m)  Wt 170 lb (77.111 kg)  BMI 36.78 kg/m2  SpO2 97%  LMP 01/30/2014 CONSTITUTIONAL: Alert and oriented and responds appropriately to questions. Well-appearing; well-nourished HEAD: Normocephalic EYES: Conjunctivae clear, PERRL ENT: normal nose; no rhinorrhea; moist mucous  membranes; pharynx without lesions noted, patient has pain over her right lower posterior molars diffusely without obvious abscess or obvious dental caries, no gum swelling, no facial swelling, no erythema or warmth, no drainage, no trismus or drooling, no uvular deviation, no tonsillar hypertrophy or exudate, no voice changes, no stridor, no difficulty swallowing, no swelling under her tongue or submandibular region NECK: Supple, no meningismus, no LAD  CARD: RRR; S1 and S2 appreciated; no murmurs, no clicks, no rubs, no gallops RESP: Normal chest excursion without splinting or tachypnea; breath sounds clear and equal bilaterally; no wheezes, no rhonchi, no rales,  ABD/GI: Normal bowel sounds; non-distended; soft, non-tender, no rebound, no guarding BACK:  The back appears normal and is non-tender to palpation, there is no CVA tenderness EXT: Normal ROM in all joints; non-tender to palpation; no edema; normal capillary refill; no cyanosis    SKIN: Normal color for age and race; warm NEURO: Moves all extremities equally PSYCH: The patient's mood and manner are appropriate. Grooming and personal hygiene are appropriate.  MEDICAL DECISION MAKING: Patient here with symptoms of indigestion, GERD. She is feeling completely better after GI cocktail and Protonix. We'll discharge him with prescription for omeprazole. EKG shows no ischemic changes or arrhythmia. She is PERC negative and has no risk factors for ACS other than tobacco use. We'll have her continue clindamycin for her dental pain and she has dental followup tomorrow. Discussed supportive care instructions and return precautions. She verbalized understanding and is comfortable with plan.       EKG Interpretation  Date/Time:  Sunday February 05 2014 22:13:55 EDT Ventricular Rate:  108 PR Interval:  128 QRS Duration: 78 QT Interval:  368 QTC Calculation: 493 R Axis:   67 Text Interpretation:  Sinus tachycardia Otherwise normal ECG Confirmed by  Caoilainn Sacks,  DO, Jonelle Bann ([id number]) on 02/05/2014 10:26:49 PM        [place], DO 02/05/14 2339

## 2014-02-09 NOTE — Telephone Encounter (Signed)
Pt returning nurse call, pls f/u with pt.

## 2014-02-14 ENCOUNTER — Encounter: Payer: Self-pay | Admitting: Internal Medicine

## 2014-02-14 ENCOUNTER — Ambulatory Visit: Payer: No Typology Code available for payment source | Attending: Internal Medicine | Admitting: Internal Medicine

## 2014-02-14 VITALS — BP 135/98 | HR 96 | Temp 98.5°F | Resp 16 | Ht 60.0 in | Wt 182.0 lb

## 2014-02-14 DIAGNOSIS — F331 Major depressive disorder, recurrent, moderate: Secondary | ICD-10-CM | POA: Insufficient documentation

## 2014-02-14 DIAGNOSIS — Z885 Allergy status to narcotic agent status: Secondary | ICD-10-CM | POA: Insufficient documentation

## 2014-02-14 DIAGNOSIS — F17200 Nicotine dependence, unspecified, uncomplicated: Secondary | ICD-10-CM | POA: Insufficient documentation

## 2014-02-14 DIAGNOSIS — J45909 Unspecified asthma, uncomplicated: Secondary | ICD-10-CM | POA: Insufficient documentation

## 2014-02-14 DIAGNOSIS — F4323 Adjustment disorder with mixed anxiety and depressed mood: Secondary | ICD-10-CM | POA: Insufficient documentation

## 2014-02-14 DIAGNOSIS — J40 Bronchitis, not specified as acute or chronic: Secondary | ICD-10-CM | POA: Insufficient documentation

## 2014-02-14 DIAGNOSIS — Z888 Allergy status to other drugs, medicaments and biological substances status: Secondary | ICD-10-CM | POA: Insufficient documentation

## 2014-02-14 MED ORDER — TRAZODONE HCL 50 MG PO TABS: 50.0000 mg | ORAL_TABLET | Freq: Every day | ORAL | Status: DC

## 2014-02-14 MED ORDER — FLUOXETINE HCL 20 MG PO CAPS: 20.0000 mg | ORAL_CAPSULE | Freq: Every day | ORAL | Status: DC

## 2014-02-14 NOTE — Progress Notes (Signed)
Patient ID: AOI KOUNS, female   DOB: 11/28/1980, 33 y.o.   MRN: [id number]   Elizabeth Dean, is a 33 y.o. female  WEX:[id number]  LFY:[id number]  DOB - Jul 05, 1981  Chief Complaint  Patient presents with  . Follow-up        Subjective:   Elizabeth Dean is a 33 y.o. female here today for a follow up visit. Patient has history of generalized anxiety disorder and panic attacks came in today with nonspecific symptoms of back pain and insomnia. She was tearful during this visit. She endorses domestic stress which include having to care for her 4 children as a single mother, husband is currently incarcerated, she has no significant family support. 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 She smoke about half a pack of cigarette per day and drinks alcohol occasionally. She denies the use of illicit drugs. She has been struggling with weight gain and she is looking for ways to lose to weight. Patient has No headache, No chest pain, No abdominal pain - No Nausea, No new weakness tingling or numbness, No Cough - SOB.  Problem  Adjustment Disorder With Mixed Anxiety and Depressed Mood    ALLERGIES: Allergies  Allergen Reactions  . Hydrocodone Other (See Comments)    Flares gallbladder  . Codeine Hives    PAST MEDICAL HISTORY: Past Medical History  Diagnosis Date  . Asthma   . Bronchitis   . Anxiety   . Panic attacks     MEDICATIONS AT HOME: Prior to Admission medications   Medication Sig Start Date End Date Taking? Authorizing Provider  albuterol (PROVENTIL HFA;VENTOLIN HFA) 108 (90 BASE) MCG/ACT inhaler Inhale 2 puffs into the lungs every 4 (four) hours as needed for wheezing or shortness of breath. 10/08/13  Yes Mirna Mires, MD  clindamycin (CLEOCIN) 150 MG capsule Take 1 capsule (150 mg total) by mouth every 6 (six) hours. 02/02/14  Yes Noland Fordyce, PA-C  FLUoxetine (PROZAC) 20 MG capsule Take 1 capsule (20 mg total) by mouth daily. 02/14/14  Yes Angelica Chessman, MD  meloxicam  (MOBIC) 15 MG tablet Take 1 tablet (15 mg total) by mouth daily. 01/27/14  Yes Angelica Chessman, MD  omeprazole (PRILOSEC) 20 MG capsule Take 2 capsules (40 mg total) by mouth daily. 02/05/14  Yes Kristen N Ward, DO  traMADol (ULTRAM) 50 MG tablet Take 1 tablet (50 mg total) by mouth every 6 (six) hours as needed. 02/02/14  Yes Noland Fordyce, PA-C  gemfibrozil (LOPID) 600 MG tablet Take 600 mg by mouth 2 (two) times daily before a meal.    Historical Provider, MD  traZODone (DESYREL) 50 MG tablet Take 1 tablet (50 mg total) by mouth at bedtime. 02/14/14   Angelica Chessman, MD     Objective:   Filed Vitals:   02/14/14 1059  BP: 135/98  Pulse: 96  Temp: 98.5 F (36.9 C)  TempSrc: Oral  Resp: 16  Height: 5' (1.524 m)  Weight: 182 lb (82.555 kg)  SpO2: 98%    Exam General appearance : Awake, alert, not in any distress. Speech Clear. Not toxic looking, obese HEENT: Atraumatic and Normocephalic, pupils equally reactive to light and accomodation Neck: supple, no JVD. No cervical lymphadenopathy.  Chest:Good air entry bilaterally, no added sounds  CVS: S1 S2 regular, no murmurs.  Abdomen: Bowel sounds present, Non tender and not distended with no gaurding, rigidity or rebound. Extremities: B/L Lower Ext shows no edema, both legs are warm to touch Neurology: Awake alert, and  oriented X 3, CN II-XII intact, Non focal  Data Review No results found for this basename: HGBA1C     Assessment & Plan   1. Adjustment disorder with mixed anxiety and depressed mood  - traZODone (DESYREL) 50 MG tablet; Take 1 tablet (50 mg total) by mouth at bedtime.  Dispense: 30 tablet; Refill: 3 - FLUoxetine (PROZAC) 20 MG capsule; Take 1 capsule (20 mg total) by mouth daily.  Dispense: 90 capsule; Refill: 3  2. Major depressive disorder, recurrent episode, moderate  - traZODone (DESYREL) 50 MG tablet; Take 1 tablet (50 mg total) by mouth at bedtime.  Dispense: 30 tablet; Refill: 3 - FLUoxetine (PROZAC)  20 MG capsule; Take 1 capsule (20 mg total) by mouth daily.  Dispense: 90 capsule; Refill: 3  Patient was given same day appointment with our social worker and behavioral therapist here Patient was extensively counseled about nutrition and exercise Patient was counseled about smoking cessation  Please refer to the social worker note for today's encounter  Return in about 6 months (around 08/17/2014), or if symptoms worsen or fail to improve, for Generalized Anxiety Disorder, Follow up Pain and comorbidities.  The patient was given clear instructions to go to ER or return to medical center if symptoms don't improve, worsen or new problems develop. The patient verbalized understanding. The patient was told to call to get lab results if they haven't heard anything in the next week.   This note has been created with Surveyor, quantity. Any transcriptional errors are unintentional.    Angelica Chessman, MD, [place], [place], [place] and [facility], [place]   02/14/2014, 11:44 AM

## 2014-02-14 NOTE — Progress Notes (Signed)
Pt is here following up on her asthma and anxiety.  Pt reports having a hard time loosing weight.

## 2014-02-14 NOTE — Patient Instructions (Signed)
Adjustment Disorder Most changes in life can cause stress. Getting used to changes may take a few months or longer. If feelings of stress, hopelessness, or worry continue, you may have an adjustment disorder. This stress-related mental health problem may affect your feelings, thinking and how you act. It occurs in both sexes and happens at any age. SYMPTOMS  Some of the following problems may be seen and vary from person to person:  Sadness or depression.  Loss of enjoyment.  Thoughts of suicide.  Fighting.  Avoiding family and friends.  Poor school performance.  Hopelessness, sense of loss.  Trouble sleeping.  Vandalism.  Worry, weight loss or gain.  Crying spells.  Anxiety  Reckless driving.  Skipping school.  Poor work Systems analyst.  Nervousness.  Ignoring bills.  Poor attitude. DIAGNOSIS  Your caregiver will ask what has happened in your life and do a physical exam. They will make a diagnosis of an adjustment disorder when they are sure another problem or medical illness causing your feelings does not exist. TREATMENT  When problems caused by stress interfere with you daily life or last longer than a few months, you may need counseling for an adjustment disorder. Early treatment may diminish problems and help you to better cope with the stressful events in your life. Sometimes medication is necessary. Individual counseling and or support groups can be very helpful. PROGNOSIS  Adjustment disorders usually last less than 3 to 6 months. The condition may persist if there is long lasting stress. This could include health problems, relationship problems, or job difficulties where you can not easily escape from what is causing the problem. PREVENTION  Even the most mentally healthy, highly functioning people can suffer from an adjustment disorder given a significant blow from a life-changing event. There is no way to prevent pain and loss. Most people need help from time  to time. You are not alone. SEEK MEDICAL CARE IF:  Your feelings or symptoms listed above do not improve or worsen. Document Released: 03/11/2006 Document Revised: 09/29/2011 Document Reviewed: 06/02/2007 [facility] Patient Information 2015 [place], [place]. This information is not intended to replace advice given to you by your health care provider. Make sure you discuss any questions you have with your health care provider. Depression Depression refers to feeling sad, low, down in the dumps, blue, gloomy, or empty. In general, there are two kinds of depression: 1. Normal sadness or normal grief. This kind of depression is one that we all feel from time to time after upsetting life experiences, such as the loss of a job or the ending of a relationship. This kind of depression is considered normal, is short lived, and resolves within a few days to 2 weeks. Depression experienced after the loss of a loved one (bereavement) often lasts longer than 2 weeks but normally gets better with time. 2. Clinical depression. This kind of depression lasts longer than normal sadness or normal grief or interferes with your ability to function at home, at work, and in school. It also interferes with your personal relationships. It affects almost every aspect of your life. Clinical depression is an illness. Symptoms of depression can also be caused by conditions other than those mentioned above, such as:  Physical illness. Some physical illnesses, including underactive thyroid gland (hypothyroidism), severe anemia, specific types of cancer, diabetes, uncontrolled seizures, heart and lung problems, strokes, and chronic pain are commonly associated with symptoms of depression.  Side effects of some prescription medicine. In some people, certain types of  medicine can cause symptoms of depression.  Substance abuse. Abuse of alcohol and illicit drugs can cause symptoms of depression. SYMPTOMS Symptoms of normal sadness and  normal grief include the following:  Feeling sad or crying for short periods of time.  Not caring about anything (apathy).  Difficulty sleeping or sleeping too much.  No longer able to enjoy the things you used to enjoy.  Desire to be by oneself all the time (social isolation).  Lack of energy or motivation.  Difficulty concentrating or remembering.  Change in appetite or weight.  Restlessness or agitation. Symptoms of clinical depression include the same symptoms of normal sadness or normal grief and also the following symptoms:  Feeling sad or crying all the time.  Feelings of guilt or worthlessness.  Feelings of hopelessness or helplessness.  Thoughts of suicide or the desire to harm yourself (suicidal ideation).  Loss of touch with reality (psychotic symptoms). Seeing or hearing things that are not real (hallucinations) or having false beliefs about your life or the people around you (delusions and paranoia). DIAGNOSIS  The diagnosis of clinical depression is usually based on how bad the symptoms are and how long they have lasted. Your health care provider will also ask you questions about your medical history and substance use to find out if physical illness, use of prescription medicine, or substance abuse is causing your depression. Your health care provider may also order blood tests. TREATMENT  Often, normal sadness and normal grief do not require treatment. However, sometimes antidepressant medicine is given for bereavement to ease the depressive symptoms until they resolve. The treatment for clinical depression depends on how bad the symptoms are but often includes antidepressant medicine, counseling with a mental health professional, or both. Your health care provider will help to determine what treatment is best for you. Depression caused by physical illness usually goes away with appropriate medical treatment of the illness. If prescription medicine is causing  depression, talk with your health care provider about stopping the medicine, decreasing the dose, or changing to another medicine. Depression caused by the abuse of alcohol or illicit drugs goes away when you stop using these substances. Some adults need professional help in order to stop drinking or using drugs. SEEK IMMEDIATE MEDICAL CARE IF:  You have thoughts about hurting yourself or others.  You lose touch with reality (have psychotic symptoms).  You are taking medicine for depression and have a serious side effect. FOR MORE INFORMATION  National Alliance on Mental Illness: www.nami.[employer] of [employer]: https://carter.com/ Document Released: 07/04/2000 Document Revised: 11/21/2013 Document Reviewed: 10/06/2011 [facility] Patient Information 2015 [place], [place]. This information is not intended to replace advice given to you by your health care provider. Make sure you discuss any questions you have with your health care provider.

## 2014-02-15 ENCOUNTER — Ambulatory Visit: Payer: No Typology Code available for payment source | Attending: Internal Medicine | Admitting: *Deleted

## 2014-02-15 DIAGNOSIS — Z599 Problem related to housing and economic circumstances, unspecified: Secondary | ICD-10-CM

## 2014-02-15 NOTE — Progress Notes (Signed)
LCSW completed referral for case management services.  Christene Lye MSW, LCSW

## 2014-02-16 ENCOUNTER — Other Ambulatory Visit: Payer: No Typology Code available for payment source

## 2014-02-19 ENCOUNTER — Encounter (HOSPITAL_COMMUNITY): Payer: Self-pay | Admitting: Emergency Medicine

## 2014-02-19 ENCOUNTER — Emergency Department (HOSPITAL_COMMUNITY)
Admission: EM | Admit: 2014-02-19 | Discharge: 2014-02-19 | Disposition: A | Discharge status: Home / Self Care | Payer: No Typology Code available for payment source | Attending: Emergency Medicine | Admitting: Emergency Medicine

## 2014-02-19 DIAGNOSIS — Z791 Long term (current) use of non-steroidal anti-inflammatories (NSAID): Secondary | ICD-10-CM | POA: Insufficient documentation

## 2014-02-19 DIAGNOSIS — F410 Panic disorder [episodic paroxysmal anxiety] without agoraphobia: Secondary | ICD-10-CM | POA: Insufficient documentation

## 2014-02-19 DIAGNOSIS — K089 Disorder of teeth and supporting structures, unspecified: Secondary | ICD-10-CM | POA: Insufficient documentation

## 2014-02-19 DIAGNOSIS — F17200 Nicotine dependence, unspecified, uncomplicated: Secondary | ICD-10-CM | POA: Insufficient documentation

## 2014-02-19 DIAGNOSIS — Z792 Long term (current) use of antibiotics: Secondary | ICD-10-CM | POA: Insufficient documentation

## 2014-02-19 DIAGNOSIS — Z79899 Other long term (current) drug therapy: Secondary | ICD-10-CM | POA: Insufficient documentation

## 2014-02-19 DIAGNOSIS — K047 Periapical abscess without sinus: Secondary | ICD-10-CM | POA: Insufficient documentation

## 2014-02-19 DIAGNOSIS — J45909 Unspecified asthma, uncomplicated: Secondary | ICD-10-CM | POA: Insufficient documentation

## 2014-02-19 MED ORDER — OXYCODONE-ACETAMINOPHEN 5-325 MG PO TABS
1.0000 | ORAL_TABLET | Freq: Once | ORAL | Status: AC
  Administered 2014-02-19: 1 via ORAL
  Filled 2014-02-19: qty 1

## 2014-02-19 MED ORDER — AMOXICILLIN 500 MG PO CAPS: 500.0000 mg | ORAL_CAPSULE | Freq: Three times a day (TID) | ORAL | Status: DC

## 2014-02-19 MED ORDER — OXYCODONE-ACETAMINOPHEN 5-325 MG PO TABS: ORAL_TABLET | ORAL | Status: DC

## 2014-02-19 NOTE — ED Notes (Signed)
Declined W/C at D/C and was escorted to lobby by RN. 

## 2014-02-19 NOTE — ED Notes (Signed)
Pt. Stated, I have a abscess on my tooth for about a week.  i had the same problem about 6 months ago.  Pt. Did not go to any dentist.

## 2014-02-19 NOTE — ED Provider Notes (Signed)
CSN: [id number]     Arrival date & time 02/19/14  1316 History  This chart was scribed for a non-physician practitioner, Monico Blitz, working with Orlie Dakin, MD by Martinique Peace, ED Scribe. The patient was seen in TR10C/TR10C. The patient's care was started at 3:33 PM.    Chief Complaint  Patient presents with  . Dental Problem      The history is provided by the patient. No language interpreter was used.   HPI Comments: Elizabeth Dean is a 33 y.o. female who presents to the Emergency Department complaining of dental pain lower left aspect of her mouth onset 1 week with associated abscess and fever. Pt describes pain as 7/10 currently. She reports history of similar occurrence about 6 months ago. Pt denies any nausea or vomiting.  Pt further denies having a regular dentist.  Past Medical History  Diagnosis Date  . Asthma   . Bronchitis   . Anxiety   . Panic attacks    Past Surgical History  Procedure Laterality Date  . Tubal ligation    . Cholecystectomy     Family History  Problem Relation Age of Onset  . Heart disease Maternal Grandmother   . Hypertension Maternal Grandmother    History  Substance Use Topics  . Smoking status: Light Tobacco Smoker -- 0.50 packs/day    Types: Cigarettes  . Smokeless tobacco: Not on file  . Alcohol Use: 1.2 oz/week    2 Cans of beer per week   OB History   Grav Para Term Preterm Abortions TAB SAB Ect Mult Living                 Review of Systems  HENT: Positive for dental problem.        Abscess to lower left gums.   Gastrointestinal: Negative for nausea and vomiting.   A complete 10 system review of systems was obtained and all systems are negative except as noted in the HPI and PMH.     Allergies  Hydrocodone and Codeine  Home Medications   Prior to Admission medications   Medication Sig Start Date End Date Taking? Authorizing Provider  albuterol (PROVENTIL HFA;VENTOLIN HFA) 108 (90 BASE) MCG/ACT inhaler Inhale  2 puffs into the lungs every 4 (four) hours as needed for wheezing or shortness of breath. 10/08/13   Mirna Mires, MD  clindamycin (CLEOCIN) 150 MG capsule Take 1 capsule (150 mg total) by mouth every 6 (six) hours. 02/02/14   Noland Fordyce, PA-C  FLUoxetine (PROZAC) 20 MG capsule Take 1 capsule (20 mg total) by mouth daily. 02/14/14   Angelica Chessman, MD  gemfibrozil (LOPID) 600 MG tablet Take 600 mg by mouth 2 (two) times daily before a meal.    Historical Provider, MD  meloxicam (MOBIC) 15 MG tablet Take 1 tablet (15 mg total) by mouth daily. 01/27/14   Angelica Chessman, MD  omeprazole (PRILOSEC) 20 MG capsule Take 2 capsules (40 mg total) by mouth daily. 02/05/14   Kristen N Ward, DO  traMADol (ULTRAM) 50 MG tablet Take 1 tablet (50 mg total) by mouth every 6 (six) hours as needed. 02/02/14   Noland Fordyce, PA-C  traZODone (DESYREL) 50 MG tablet Take 1 tablet (50 mg total) by mouth at bedtime. 02/14/14   Angelica Chessman, MD   BP 126/80  Pulse 111  Temp(Src) 98.2 F (36.8 C) (Oral)  Resp 20  SpO2 98%  LMP 02/09/2014 Physical Exam  Nursing note and vitals reviewed. Constitutional: She is  oriented to person, place, and time. She appears well-developed and well-nourished. No distress.  HENT:  Head: Normocephalic.  Largely edentulous, mild swelling to left lower jaw with no pointing or areas of fluctuance. The swallowing secretions, no tenderness to palpation under the gums.  Eyes: Conjunctivae and EOM are normal.  Cardiovascular: Normal rate, regular rhythm and intact distal pulses.   Pulmonary/Chest: Effort normal and breath sounds normal. No stridor.  Abdominal: Soft.  Musculoskeletal: Normal range of motion.  Neurological: She is alert and oriented to person, place, and time.  Psychiatric: She has a normal mood and affect.    ED Course  Procedures (including critical care time)  Results for orders placed in visit on 01/02/14  CBC WITH DIFFERENTIAL      Result Value Ref Range    WBC 12.3 (*) 4.0 - 10.5 K/uL   RBC 4.46  3.87 - 5.11 MIL/uL   Hemoglobin 11.7 (*) 12.0 - 15.0 g/dL   HCT 35.8 (*) 36.0 - 46.0 %   MCV 80.3  78.0 - 100.0 fL   MCH 26.2  26.0 - 34.0 pg   MCHC 32.7  30.0 - 36.0 g/dL   RDW 16.8 (*) 11.5 - 15.5 %   Platelets 305  150 - 400 K/uL   Neutrophils Relative % 64  43 - 77 %   Neutro Abs 7.9 (*) 1.7 - 7.7 K/uL   Lymphocytes Relative 26  12 - 46 %   Lymphs Abs 3.2  0.7 - 4.0 K/uL   Monocytes Relative 6  3 - 12 %   Monocytes Absolute 0.7  0.1 - 1.0 K/uL   Eosinophils Relative 4  0 - 5 %   Eosinophils Absolute 0.5  0.0 - 0.7 K/uL   Basophils Relative 0  0 - 1 %   Basophils Absolute 0.0  0.0 - 0.1 K/uL   Smear Review Criteria for review not met    COMPLETE METABOLIC PANEL WITH GFR      Result Value Ref Range   Sodium 139  135 - 145 mEq/L   Potassium 3.9  3.5 - 5.3 mEq/L   Chloride 105  96 - 112 mEq/L   CO2 27  19 - 32 mEq/L   Glucose, Bld 79  70 - 99 mg/dL   BUN 12  6 - 23 mg/dL   Creat 0.63  0.50 - 1.10 mg/dL   Total Bilirubin 0.3  0.2 - 1.2 mg/dL   Alkaline Phosphatase 65  39 - 117 U/L   AST 40 (*) 0 - 37 U/L   ALT 45 (*) 0 - 35 U/L   Total Protein 6.4  6.0 - 8.3 g/dL   Albumin 3.9  3.5 - 5.2 g/dL   Calcium 8.7  8.4 - 10.5 mg/dL   GFR, Est African American >89     GFR, Est Non African American >89    LIPID PANEL      Result Value Ref Range   Cholesterol 129  0 - 200 mg/dL   Triglycerides 383 (*) <150 mg/dL   HDL 23 (*) >39 mg/dL   Total CHOL/HDL Ratio 5.6     VLDL 77 (*) 0 - 40 mg/dL   LDL Cholesterol 29  0 - 99 mg/dL  TSH      Result Value Ref Range   TSH 1.761  0.350 - 4.500 uIU/mL  URINALYSIS, COMPLETE      Result Value Ref Range   Color, Urine YELLOW  YELLOW   APPearance CLEAR  CLEAR  Specific Gravity, Urine 1.020  1.005 - 1.030   pH 6.5  5.0 - 8.0   Glucose, UA NEG  NEG mg/dL   Bilirubin Urine NEG  NEG   Ketones, ur NEG  NEG mg/dL   Hgb urine dipstick TRACE (*) NEG   Protein, ur NEG  NEG mg/dL   Urobilinogen, UA  0.2  0.0 - 1.0 mg/dL   Nitrite NEG  NEG   Leukocytes, UA MOD (*) NEG   Squamous Epithelial / LPF FEW  RARE   Crystals NONE SEEN  NONE SEEN   Casts NONE SEEN  NONE SEEN   WBC, UA 21-50 (*) <3 WBC/hpf   RBC / HPF 0-2  <3 RBC/hpf   Bacteria, UA NONE SEEN  RARE   Mr Brain Wo Contrast  01/25/2014   CLINICAL DATA:  Multiple syncopal episodes.  Falls.  EXAM: MRI HEAD WITHOUT CONTRAST  TECHNIQUE: Multiplanar, multiecho pulse sequences of the brain and surrounding structures were obtained without intravenous contrast.  COMPARISON:  None.  FINDINGS: New ventricle size is normal. Craniocervical junction normal. Negative for Chiari malformation. Pituitary is normal in size. No compression of the optic chiasm.  Negative for acute or chronic infarct. Negative for demyelinating disease.  Negative for hemorrhage or mass. No edema or shift of the midline structures.  Large retention cyst filling the left maxillary sinus.  IMPRESSION: Normal MRI of the brain  Large retention cyst left maxillary sinus.   Electronically Signed   By: Franchot Gallo M.D.   On: 01/25/2014 10:39      Labs Review Labs Reviewed - No data to display  Imaging Review No results found.   EKG Interpretation None     Medications - No data to display  3:37 PM- Treatment plan was discussed with patient who verbalizes understanding and agrees which includes numbing injection to affected area.   MDM   Final diagnoses:  None    Filed Vitals:   02/19/14 1325 02/19/14 1604  BP: 126/80 126/75  Pulse: 111 101  Temp: 98.2 F (36.8 C) 98.5 F (36.9 C)  TempSrc: Oral Oral  Resp: 20 18  SpO2: 98% 98%    Medications  oxyCODONE-acetaminophen (PERCOCET/ROXICET) 5-325 MG per tablet 1 tablet (1 tablet Oral Given 02/19/14 1619)   Laquanna L Sime is a 33 y.o. female complaining of left lower dental pain and swelling. No gross dental abscess however there is a small amount of swelling. No airway compromise, no signs of Ludwig angina.  Patient will be treated with amoxicillin. Return precautions discussed and patient given dental followup.   Evaluation does not show pathology that would require ongoing emergent intervention or inpatient treatment. Pt is hemodynamically stable and mentating appropriately. Discussed findings and plan with patient/guardian, who agrees with care plan. All questions answered. Return precautions discussed and outpatient follow up given.   Discharge Medication List as of 02/19/2014  4:03 PM    START taking these medications   Details  amoxicillin (AMOXIL) 500 MG capsule Take 1 capsule (500 mg total) by mouth 3 (three) times daily., Starting 02/19/2014, Until Discontinued, Print    oxyCODONE-acetaminophen (PERCOCET/ROXICET) 5-325 MG per tablet 1 to 2 tabs PO q6hrs  PRN for pain, Print           I personally performed the services described in this documentation, which was scribed in my presence. The recorded information has been reviewed and is accurate.    Monico Blitz, PA-C 02/22/14 1349

## 2014-02-19 NOTE — Discharge Instructions (Signed)
Take percocet for breakthrough pain, do not drink alcohol, drive, care for children or do other critical tasks while taking percocet. ° °Return to the emergency room for fever, change in vision, redness to the face that rapidly spreads towards the eye, nausea or vomiting, difficulty swallowing or shortness of breath. °  °Apply warm compresses to jaw throughout the day.  ° ° °Take your antibiotics as directed and to the end of the course. DO NOT drink alcohol when taking metronidazole, it will make you very sick!  ° °Followup with a dentist is very important for ongoing evaluation and management of recurrent dental pain. Return to emergency department for emergent changing or worsening symptoms." ° °Low-cost dental clinic: °**David  Civils  at [phone]**  °**Janna Civils at [phone] [address]**   ° °You may also call [phone] ° °Dental Assistance °If the dentist on-call cannot see you, please use the resources below: ° ° °Patients with Medicaid: [facility] °[address], [phone] °[address], [phone] ° °If unable to pay, or uninsured, contact [facility] ([phone]) or [facility] ([phone] in [place], [phone] in [place]) to become qualified for the adult dental clinic ° °Other Low-Cost Community Dental Services: °[facility]- [address], [place], [place], [address] °   [phone], Ext. [phone] °   2nd and 4th Thursday of the month at 6:30am °   10 clients each day by appointment, can sometimes see walk-in     patients if someone does not show for an appointment °[facility]- [address], [place], [place], [address] °   [phone] °[facility]- [address], [place], [place], [address] °   [phone] ° °[facility]- [phone] °[facility]- [phone] °[facility]- [phone] ° °

## 2014-02-22 ENCOUNTER — Telehealth: Payer: Self-pay | Admitting: *Deleted

## 2014-02-22 NOTE — Telephone Encounter (Signed)
Left message with family member to return my call. 

## 2014-02-22 NOTE — ED Provider Notes (Signed)
Medical screening examination/treatment/procedure(s) were performed by non-physician practitioner and as supervising physician I was immediately available for consultation/collaboration.   EKG Interpretation None       Orlie Dakin, MD 02/22/14 1624

## 2014-03-06 ENCOUNTER — Other Ambulatory Visit: Payer: Self-pay | Admitting: Internal Medicine

## 2014-03-06 ENCOUNTER — Encounter: Payer: Self-pay | Admitting: *Deleted

## 2014-03-06 DIAGNOSIS — Z599 Problem related to housing and economic circumstances, unspecified: Secondary | ICD-10-CM

## 2014-03-06 NOTE — Progress Notes (Signed)
Patient came for check in in order to follow up on referral for furniture.  LCSW has faxed the referral several times in the past week including today.  LCSW will follow up with case manager to ensure a warm handoff if necessary.  Christene Lye MSW, LCSW

## 2014-03-25 ENCOUNTER — Emergency Department (HOSPITAL_COMMUNITY)
Admission: EM | Admit: 2014-03-25 | Discharge: 2014-03-25 | Disposition: A | Discharge status: Home / Self Care | Payer: No Typology Code available for payment source | Attending: Emergency Medicine | Admitting: Emergency Medicine

## 2014-03-25 ENCOUNTER — Encounter (HOSPITAL_COMMUNITY): Payer: Self-pay | Admitting: Emergency Medicine

## 2014-03-25 DIAGNOSIS — K089 Disorder of teeth and supporting structures, unspecified: Secondary | ICD-10-CM | POA: Insufficient documentation

## 2014-03-25 DIAGNOSIS — J45909 Unspecified asthma, uncomplicated: Secondary | ICD-10-CM | POA: Insufficient documentation

## 2014-03-25 DIAGNOSIS — K0889 Other specified disorders of teeth and supporting structures: Secondary | ICD-10-CM

## 2014-03-25 DIAGNOSIS — F411 Generalized anxiety disorder: Secondary | ICD-10-CM | POA: Insufficient documentation

## 2014-03-25 DIAGNOSIS — Z792 Long term (current) use of antibiotics: Secondary | ICD-10-CM | POA: Insufficient documentation

## 2014-03-25 DIAGNOSIS — Z79899 Other long term (current) drug therapy: Secondary | ICD-10-CM | POA: Insufficient documentation

## 2014-03-25 DIAGNOSIS — Z791 Long term (current) use of non-steroidal anti-inflammatories (NSAID): Secondary | ICD-10-CM | POA: Insufficient documentation

## 2014-03-25 DIAGNOSIS — F17200 Nicotine dependence, unspecified, uncomplicated: Secondary | ICD-10-CM | POA: Insufficient documentation

## 2014-03-25 MED ORDER — OXYCODONE-ACETAMINOPHEN 5-325 MG PO TABS: 1.0000 | ORAL_TABLET | Freq: Three times a day (TID) | ORAL | Status: DC | PRN

## 2014-03-25 MED ORDER — OXYCODONE-ACETAMINOPHEN 5-325 MG PO TABS
1.0000 | ORAL_TABLET | Freq: Once | ORAL | Status: AC
  Administered 2014-03-25: 1 via ORAL
  Filled 2014-03-25: qty 1

## 2014-03-25 MED ORDER — AMOXICILLIN 500 MG PO CAPS: 500.0000 mg | ORAL_CAPSULE | Freq: Three times a day (TID) | ORAL | Status: DC

## 2014-03-25 NOTE — ED Provider Notes (Signed)
Medical screening examination/treatment/procedure(s) were performed by non-physician practitioner and as supervising physician I was immediately available for consultation/collaboration.   EKG Interpretation None        Kristen N Ward, DO 03/25/14 2317 

## 2014-03-25 NOTE — ED Notes (Signed)
Patient with pain in the left lower for 2 days.  She has pain and swelling noted to jaw.  She took ibuprofen prior to arrival

## 2014-03-25 NOTE — Discharge Instructions (Signed)
Dental Pain °A tooth ache may be caused by cavities (tooth decay). Cavities expose the nerve of the tooth to air and hot or cold temperatures. It may come from an infection or abscess (also called a boil or furuncle) around your tooth. It is also often caused by dental caries (tooth decay). This causes the pain you are having. °DIAGNOSIS  °Your caregiver can diagnose this problem by exam. °TREATMENT  °· If caused by an infection, it may be treated with medications which kill germs (antibiotics) and pain medications as prescribed by your caregiver. Take medications as directed. °· Only take over-the-counter or prescription medicines for pain, discomfort, or fever as directed by your caregiver. °· Whether the tooth ache today is caused by infection or dental disease, you should see your dentist as soon as possible for further care. °SEEK MEDICAL CARE IF: °The exam and treatment you received today has been provided on an emergency basis only. This is not a substitute for complete medical or dental care. If your problem worsens or new problems (symptoms) appear, and you are unable to meet with your dentist, call or return to this location. °SEEK IMMEDIATE MEDICAL CARE IF:  °· You have a fever. °· You develop redness and swelling of your face, jaw, or neck. °· You are unable to open your mouth. °· You have severe pain uncontrolled by pain medicine. °MAKE SURE YOU:  °· Understand these instructions. °· Will watch your condition. °· Will get help right away if you are not doing well or get worse. °Document Released: 07/07/2005 Document Revised: 09/29/2011 Document Reviewed: 02/23/2008 °ExitCare® Patient Information ©2015 [employer]. This information is not intended to replace advice given to you by your health care provider. Make sure you discuss any questions you have with your health care provider. ° °Emergency Department Resource Guide °1) Find a Doctor and Pay Out of Pocket °Although you won't have to find out who  is covered by your insurance plan, it is a good idea to ask around and get recommendations. You will then need to call the office and see if the doctor you have chosen will accept you as a new patient and what types of options they offer for patients who are self-pay. Some doctors offer discounts or will set up payment plans for their patients who do not have insurance, but you will need to ask so you aren't surprised when you get to your appointment. ° °2) Contact Your Local Health Department °Not all health departments have doctors that can see patients for sick visits, but many do, so it is worth a call to see if yours does. If you don't know where your local health department is, you can check in your phone book. The CDC also has a tool to help you locate your state's health department, and many state websites also have listings of all of their local health departments. ° °3) Find a Walk-in Clinic °If your illness is not likely to be very severe or complicated, you may want to try a walk in clinic. These are popping up all over the country in pharmacies, drugstores, and shopping centers. They're usually staffed by nurse practitioners or physician assistants that have been trained to treat common illnesses and complaints. They're usually fairly quick and inexpensive. However, if you have serious medical issues or chronic medical problems, these are probably not your best option. ° °No Primary Care Doctor: °- Call [facility] at  832-8000 - they can help you locate a primary   care doctor that  accepts your insurance, provides certain services, etc. °- Physician Referral Service- 1-800-533-3463 ° °Chronic Pain Problems: °Organization         Address  Phone   Notes  °[facility]  (336) 297-2271 Patients need to be referred by their primary care doctor.  ° °Medication Assistance: °Organization         Address  Phone   Notes  °[place] [facility] [address]  [address] °[place], [place] [address] (336) 641-8030 --Must be a resident of [place] °-- Must have NO insurance coverage whatsoever (no Medicaid/ Medicare, etc.) °-- The pt. MUST have a primary care doctor that directs their care regularly and follows them in the community °  °[facility]  (866) 331-1348   °[employer]  (888) 892-1162   ° °Agencies that provide inexpensive medical care: °Organization         Address  Phone   Notes  °[facility]  (336) 832-8035   °[facility]    (336) 832-7272   °[facility] [address] °[place], [place] [address] (336) 832-4777   °[facility] [address], °[place] (336) 271-4999   °[facility]    (336) 373-0678   °[facility]    (336) 272-1050   °[facility] ° [address], [place] Phone:  (336) 832-4444, Fax:  (336) 832-4440 Hours of Operation:  9 am - 6 pm, M-F.  Also accepts Medicaid/Medicare and self-pay.  °[facility] ° [address], [place] Phone: (336) 832-3150, Fax: (336) 832-3151. Hours of Operation:  8:30 am - 5:30 pm, M-F.  Also accepts Medicaid and self-pay.  °[facility] [place] [address], [place] Phone: (336) 878-6027   °[facility] [address], [place], [place] (336)723-1848, Ext. 123 Mondays & Thursdays: 7-9 AM.  First 15 patients are seen on a first come, first serve basis. °  ° °Medicaid-accepting [place] Providers: ° °Organization         Address  Phone   Notes  °[facility] [address], [place] (336) 641-2100 Also accepts self-pay patients.  °[facility] [address], [place] ° (336) 856-9996   °[facility] [address], [place] (336) 288-8857   °[facility] [address] [place] [address], [place] (336) 299-7000   °Veita Bland [address], [place]  ° (336) 373-1557 Only accepts [place] Access Medicaid patients after they have their name applied to their card.  ° °Self-Pay (no insurance) in [place]: ° °Organization         Address  Phone   Notes  °Sickle Cell Patients, [facility] [address], [place] (336) 832-1970   °[facility] [address], [place] (336) 832-4400   ° [facility] ° [address], [place] (336) 992-4800   °[facility]/Dr. Osei-Bonsu ° [address] [place] [address], [place] or [address], [place] (336) 841-8500 Phone number for both [place] and [place] locations is the same.  °[facility] [address], [place] (336) 299-0000   °[facility] [address] [place] [address],  or [address] (336) 852-7530 °(336) 878-2260   °  [facility] [address], [place] (336) 350-1642, phone; (336) 294-5005, fax Sees patients 1st and 3rd Saturday of every month.  Must not qualify for public or private insurance (i.e. Medicaid, Medicare, [place] Health Choice, Veterans' Benefits) • Household income should be no more than 200% of the poverty level •The clinic cannot treat you if you are pregnant or think you are pregnant • Sexually transmitted diseases are not treated at the clinic.  ° ° °Dental Care: °Organization         Address  Phone  Notes  °[facility] [facility] [address], [place] (336) 641-6152 Accepts children up to age 21 who are enrolled in Medicaid or [place] Health Choice; pregnant women with a Medicaid card; and children who have applied for Medicaid or [place] Health Choice, but were declined, whose parents can pay a reduced fee at time of service.  °[facility] [place]  [address], [place] (336) 641-7733 Accepts children up to age 21 who are  enrolled in Medicaid or [place] Health Choice; pregnant women with a Medicaid card; and children who have applied for Medicaid or [place] Health Choice, but were declined, whose parents can pay a reduced fee at time of service.  °[facility] ° [address], [place] (336) 641-4533 Patients are seen by appointment only. Walk-ins are not accepted. [facility] will see patients 18 years of age and older. °Monday - Tuesday (8am-5pm) °Most Wednesdays (8:30-5pm) °$30 per visit, cash only  °[facility] ° [address], [place] (336) 641-4533 Patients are seen by appointment only. Walk-ins are not accepted. [facility] will see patients 18 years of age and older. °One Wednesday Evening (Monthly: Volunteer Based).  $30 per visit, cash only  °[facility]  (919) 537-3737 for adults; Children under age 4, call Graduate Pediatric Dentistry at ([phone]) [phone]. Children aged 4-14, please call (919) [phone] to request a pediatric application. ° Dental services are provided in all areas of dental care including fillings, crowns and bridges, complete and partial dentures, implants, gum treatment, root canals, and extractions. Preventive care is also provided. Treatment is provided to both adults and children. °Patients are selected via a lottery and there is often a waiting list. °  °[facility] [address], °[place] ° (336) 763-8833 [email or web address] °  °[facility] [address], [place], [place] (336)723-1848, Ext. 123 Second and Fourth Thursday of each month, opens at 6:30 AM; Clinic ends at 9 AM.  Patients are seen on a first-come first-served basis, and a limited number are seen during each clinic.  ° °[facility] ° [address], [place], [place] (336) 723-7904   Eligibility Requirements °You must have lived in [place], [place], or [place] counties for at least the last three months. °  You  cannot be eligible for state or federal sponsored healthcare insurance, including Veterans Administration, Medicaid, or Medicare. °  You generally cannot be eligible for healthcare insurance through your employer.  °  How to apply: °Eligibility screenings are held every Tuesday and Wednesday afternoon from 1:00 pm until 4:00 pm. You do not need an appointment for the interview!  °[facility] [address], [place],  336-631-2330   °[facility]  336-342-8273   °[facility]  336-703-3100   °[facility]  336-570-6415   ° °Behavioral Health Resources in the Community: °Intensive Outpatient Programs °Organization         Address  Phone  Notes  °[facility] [address], [place], [place] 336-878-6098   °[facility] [address], [place], [place] 336-832-9800   °[facility] [address], [place], [place] ° 336-882-2125   °[facility] [address],  °[place], [place] 1-800-853-5163 or 336-641-4981   °Substance Abuse Resources °Organization         Address  Phone  Notes  °[facility]  336-882-2125   °[facility]  336-784-9470   °[facility]  336-285-9073   °[facility]  336-845-3988   °Residential & Outpatient Substance Abuse Program  1-800-659-3381   °Psychological Services °Organization         Address  Phone  Notes  °[facility]  336- 832-9600   °[facility]  336- 378-7881   °[facility] [address], [place] 1-800-853-5163 or 336-641-4981   ° °Mobile Crisis Teams °Organization         Address  Phone  Notes  °[facility], Mobile Crisis Care Unit  1-877-626-1772   °Assertive °[facility] ° [address] [place], [place] 336-834-9664   °Sharon DeEsch [address] °[place] 336-554-5454   ° °Self-Help/Support  Groups °Organization         Address  Phone             Notes  °[facility] [place] - variety of support groups  336- 373-1402 Call for more information  °Narcotics Anonymous (NA), [facility] [address], °[place]  2 meetings at this location  ° °Residential Treatment Programs °Organization         Address  Phone  Notes  °[facility] [address],    °[place]  1-866-801-8205   °[facility] ° [address], [place], [place] 704-293-8524   °[facility] [address], [place] 336-845-3988 Admissions: 8am-3pm M-F  °[facility] [address]    °[place], [place] 336-841-1104   °[facility] [address], [place], [place] 336-379-7146   °[facility] [address]  °[place], [place] 336-285-9073   °[facility] - Intensive Outpatient [address], [place], [place] 336-852-3033   °[facility] [address]  °[place], [place] 1-877-615-2722 or 336-784-9470   °[facility] [address] [place], [place] 336-227-7417 Accepts Medicaid  °[facility] [address]  °[place] 1-800-659-3381 Substance Abuse/Addiction Treatment  ° °[place] Behavioral Health Resources °Organization         Address  Phone  Notes  °[facility]  (888) 581-9988   °Julie Brannon, PhD [address] [place], [place]   (336) 349-5553 or (336) 951-0000   °[facility]   [address] °[place], [place] (336) 349-4454   °[facility] [address], [place], [place] (336) 342-8316 Insurance/Medicaid/sponsorship through [facility]  °[facility] [address]                                    [place],  (336) 342-8316 Therapy/tele-psych/case  °[facility]   [address]  ° [place], [place] (336) 349-2233    °Dr. Arfeen  (336) 349-4544   °[facility] [place]  [facility]  [employer] [facility] [place] [facility] 1) [address], [place] °2) [address], [place] °3)  [address], [place] (336) 349-3220 °(336) 342-7768 ° °(336) 342-8140   °[place] [facility] (336) 342-1394 or (336) 342-3537 (After Hours)    ° ° ° °

## 2014-03-25 NOTE — ED Provider Notes (Signed)
CSN: [id number]     Arrival date & time 03/25/14  2059 History   First MD Initiated Contact with Patient 03/25/14 2103     Chief Complaint  Patient presents with  . Dental Pain   Patient is a 33 y.o. female presenting with tooth pain. The history is provided by the patient. No language interpreter was used.  Dental Pain Location:  Lower Lower teeth location:  17/LL 3rd molar Quality:  Aching, pulsating and constant Severity:  Moderate Onset quality:  Gradual Duration:  2 days Timing:  Constant Progression:  Worsening Chronicity:  Recurrent Context: poor dentition   Context: not abscess, not crown fracture, not dental caries and filling still in place   Relieved by:  Nothing Worsened by:  Cold food/drink, hot food/drink, touching, pressure and jaw movement Ineffective treatments:  NSAIDs (200 mg ibuprofen once daily) Associated symptoms: facial pain and facial swelling   Associated symptoms: no congestion, no difficulty swallowing, no drooling, no fever, no gum swelling, no headaches, no neck pain, no neck swelling, no oral bleeding, no oral lesions and no trismus   Risk factors: lack of dental care     Past Medical History  Diagnosis Date  . Asthma   . Bronchitis   . Anxiety   . Panic attacks    Past Surgical History  Procedure Laterality Date  . Tubal ligation    . Cholecystectomy     Family History  Problem Relation Age of Onset  . Heart disease Maternal Grandmother   . Hypertension Maternal Grandmother    History  Substance Use Topics  . Smoking status: Light Tobacco Smoker -- 0.50 packs/day    Types: Cigarettes  . Smokeless tobacco: Not on file  . Alcohol Use: 1.2 oz/week    2 Cans of beer per week   OB History   Grav Para Term Preterm Abortions TAB SAB Ect Mult Living                 Review of Systems  Constitutional: Negative for fever.  HENT: Positive for facial swelling. Negative for congestion, drooling and mouth sores.   Musculoskeletal: Negative  for neck pain.  Neurological: Negative for headaches.  All other systems reviewed and are negative.     Allergies  Hydrocodone and Codeine  Home Medications   Prior to Admission medications   Medication Sig Start Date End Date Taking? Authorizing Provider  albuterol (PROVENTIL HFA;VENTOLIN HFA) 108 (90 BASE) MCG/ACT inhaler Inhale 2 puffs into the lungs every 4 (four) hours as needed for wheezing or shortness of breath. 10/08/13   Mirna Mires, MD  amoxicillin (AMOXIL) 500 MG capsule Take 1 capsule (500 mg total) by mouth 3 (three) times daily. 02/19/14   Nicole Pisciotta, PA-C  amoxicillin (AMOXIL) 500 MG capsule Take 1 capsule (500 mg total) by mouth 3 (three) times daily. 03/25/14   Jarris Kortz A Forcucci, PA-C  FLUoxetine (PROZAC) 20 MG capsule Take 1 capsule (20 mg total) by mouth daily. 02/14/14   Tresa Garter, MD  gemfibrozil (LOPID) 600 MG tablet Take 600 mg by mouth 2 (two) times daily before a meal.    Historical Provider, MD  meloxicam (MOBIC) 15 MG tablet TAKE 1 TABLET BY MOUTH ONCE DAILY 03/07/14   Tresa Garter, MD  omeprazole (PRILOSEC) 20 MG capsule Take 2 capsules (40 mg total) by mouth daily. 02/05/14   Kristen N Ward, DO  oxyCODONE-acetaminophen (PERCOCET/ROXICET) 5-325 MG per tablet 1 to 2 tabs PO q6hrs  PRN for  pain 02/19/14   Monico Blitz, PA-C  oxyCODONE-acetaminophen (PERCOCET/ROXICET) 5-325 MG per tablet Take 1 tablet by mouth every 8 (eight) hours as needed for moderate pain or severe pain. 03/25/14   Shawan Tosh A Forcucci, PA-C  traMADol (ULTRAM) 50 MG tablet Take 50 mg by mouth every 6 (six) hours as needed (pain).    Historical Provider, MD  traZODone (DESYREL) 50 MG tablet Take 1 tablet (50 mg total) by mouth at bedtime. 02/14/14   Tresa Garter, MD   BP 139/91  Pulse 104  Temp(Src) 98.8 F (37.1 C) (Oral)  Resp 22  Wt 170 lb (77.111 kg)  SpO2 100% Physical Exam  Nursing note and vitals reviewed. Constitutional: She is oriented to person, place,  and time. She appears well-developed and well-nourished. No distress.  HENT:  Head: Normocephalic and atraumatic.  Mouth/Throat: Uvula is midline, oropharynx is clear and moist and mucous membranes are normal. No oral lesions. No trismus in the jaw. No dental abscesses or uvula swelling. No oropharyngeal exudate, posterior oropharyngeal edema, posterior oropharyngeal erythema or tonsillar abscesses.    Eyes: Conjunctivae and EOM are normal. Pupils are equal, round, and reactive to light. No scleral icterus.  Neck: Normal range of motion. Neck supple. No JVD present. No thyromegaly present.  Cardiovascular: Normal rate, regular rhythm, normal heart sounds and intact distal pulses.  Exam reveals no gallop and no friction rub.   No murmur heard. Pulmonary/Chest: Effort normal and breath sounds normal. No respiratory distress. She has no wheezes. She has no rales. She exhibits no tenderness.  Musculoskeletal: Normal range of motion.  Lymphadenopathy:    She has no cervical adenopathy.  Neurological: She is alert and oriented to person, place, and time.  Skin: Skin is warm and dry. She is not diaphoretic.  Psychiatric: She has a normal mood and affect. Her behavior is normal. Judgment and thought content normal.    ED Course  Procedures (including critical care time) Labs Review Labs Reviewed - No data to display  Imaging Review No results found.   EKG Interpretation None      MDM   Final diagnoses:  Pain, dental   Patient is a 33 y.o. Female who presents to the ED with left lower jaw pain with facial swelling.  There is no obvious signs of dental abscess at this time.  There are no signs of ludwigs angina or trismus.  Will cover with amoxicillin for periapical abscess and will give #10 percocets.  Patient was told to return to the ED with worsening facial swelling, drooling, trismus, or any other concerning symptoms.  Patient is stable for discharge at this time.  Patient to  follow-up with dentistry.      Cherylann Parr, PA-C 03/25/14 2125

## 2014-04-04 ENCOUNTER — Ambulatory Visit: Payer: No Typology Code available for payment source | Attending: Internal Medicine | Admitting: Internal Medicine

## 2014-04-04 ENCOUNTER — Encounter: Payer: Self-pay | Admitting: Internal Medicine

## 2014-04-04 VITALS — BP 131/91 | HR 95 | Temp 97.8°F | Resp 17 | Ht 63.0 in | Wt 173.2 lb

## 2014-04-04 DIAGNOSIS — F4323 Adjustment disorder with mixed anxiety and depressed mood: Secondary | ICD-10-CM | POA: Insufficient documentation

## 2014-04-04 DIAGNOSIS — G894 Chronic pain syndrome: Secondary | ICD-10-CM | POA: Insufficient documentation

## 2014-04-04 DIAGNOSIS — J4521 Mild intermittent asthma with (acute) exacerbation: Secondary | ICD-10-CM

## 2014-04-04 DIAGNOSIS — E781 Pure hyperglyceridemia: Secondary | ICD-10-CM | POA: Insufficient documentation

## 2014-04-04 DIAGNOSIS — F331 Major depressive disorder, recurrent, moderate: Secondary | ICD-10-CM

## 2014-04-04 DIAGNOSIS — Z23 Encounter for immunization: Secondary | ICD-10-CM | POA: Insufficient documentation

## 2014-04-04 DIAGNOSIS — R0602 Shortness of breath: Secondary | ICD-10-CM | POA: Insufficient documentation

## 2014-04-04 DIAGNOSIS — M549 Dorsalgia, unspecified: Secondary | ICD-10-CM | POA: Insufficient documentation

## 2014-04-04 DIAGNOSIS — M79609 Pain in unspecified limb: Secondary | ICD-10-CM | POA: Insufficient documentation

## 2014-04-04 DIAGNOSIS — J45901 Unspecified asthma with (acute) exacerbation: Secondary | ICD-10-CM | POA: Insufficient documentation

## 2014-04-04 LAB — LIPID PANEL
CHOLESTEROL: 105 mg/dL (ref 0–200)
HDL: 32 mg/dL — ABNORMAL LOW (ref >39)
LDL Cholesterol: 47 mg/dL (ref 0–99)
TRIGLYCERIDES: 128 mg/dL (ref <150)
Total CHOL/HDL Ratio: 3.3 Ratio
VLDL: 26 mg/dL (ref 0–40)

## 2014-04-04 MED ORDER — MELOXICAM 15 MG PO TABS: 15.0000 mg | ORAL_TABLET | Freq: Every day | ORAL | Status: DC

## 2014-04-04 MED ORDER — OMEPRAZOLE 20 MG PO CPDR: 40.0000 mg | DELAYED_RELEASE_CAPSULE | Freq: Every day | ORAL | Status: DC

## 2014-04-04 MED ORDER — GEMFIBROZIL 600 MG PO TABS: 600.0000 mg | ORAL_TABLET | Freq: Two times a day (BID) | ORAL | Status: DC

## 2014-04-04 MED ORDER — TRAMADOL HCL 50 MG PO TABS: 50.0000 mg | ORAL_TABLET | Freq: Four times a day (QID) | ORAL | Status: DC | PRN

## 2014-04-04 MED ORDER — ALBUTEROL SULFATE HFA 108 (90 BASE) MCG/ACT IN AERS: 2.0000 | INHALATION_SPRAY | RESPIRATORY_TRACT | Status: DC | PRN

## 2014-04-04 MED ORDER — FLUOXETINE HCL 40 MG PO CAPS: 40.0000 mg | ORAL_CAPSULE | Freq: Every day | ORAL | Status: DC

## 2014-04-04 NOTE — Patient Instructions (Signed)

## 2014-04-04 NOTE — Progress Notes (Signed)
Patient ID: Elizabeth Dean, female   DOB: 04-21-1981, 33 y.o.   MRN: [id number]   Elizabeth Dean, is a 33 y.o. female  [id number]  [id number]  DOB - 01-Aug-1980  Chief Complaint  Patient presents with  . Follow-up    multiple pain complaints        Subjective:   Elizabeth Dean is a 33 y.o. female here today for a follow up visit. Patient has history of asthma, anxiety and depression and panic attacks here today with multiple complaints. Patient is complaining of intense anxiety, nervousness, generalized back pain, shortness of breath pain in the thumb. Patient stated that she fell down 2 weeks ago due to spasm in her back and legs just give out. She is very nervous. She denies suicidal ideation or thoughts. She is currently on Prozac 20 mg tablet by mouth daily. She requests a refill of her medications. Patient has No headache, No chest pain, No abdominal pain - No Nausea, No new weakness tingling or numbness.  Problem  Hypertriglyceridemia  Asthma With Acute Exacerbation  Need for Prophylactic Vaccination and Inoculation Against Influenza    ALLERGIES: Allergies  Allergen Reactions  . Hydrocodone Other (See Comments)    Flares gallbladder  . Codeine Hives    PAST MEDICAL HISTORY: Past Medical History  Diagnosis Date  . Asthma   . Bronchitis   . Anxiety   . Panic attacks   . Depression     MEDICATIONS AT HOME: Prior to Admission medications   Medication Sig Start Date End Date Taking? Authorizing Provider  albuterol (PROVENTIL HFA;VENTOLIN HFA) 108 (90 BASE) MCG/ACT inhaler Inhale 2 puffs into the lungs every 4 (four) hours as needed for wheezing or shortness of breath. 04/04/14   Tresa Garter, MD  amoxicillin (AMOXIL) 500 MG capsule Take 1 capsule (500 mg total) by mouth 3 (three) times daily. 02/19/14   Nicole Pisciotta, PA-C  amoxicillin (AMOXIL) 500 MG capsule Take 1 capsule (500 mg total) by mouth 3 (three) times daily. 03/25/14   Courtney A Forcucci, PA-C   FLUoxetine (PROZAC) 40 MG capsule Take 1 capsule (40 mg total) by mouth daily. 04/04/14   Tresa Garter, MD  gemfibrozil (LOPID) 600 MG tablet Take 1 tablet (600 mg total) by mouth 2 (two) times daily before a meal. 04/04/14   Tresa Garter, MD  meloxicam (MOBIC) 15 MG tablet Take 1 tablet (15 mg total) by mouth daily. 04/04/14   Tresa Garter, MD  omeprazole (PRILOSEC) 20 MG capsule Take 2 capsules (40 mg total) by mouth daily. 04/04/14   Tresa Garter, MD  oxyCODONE-acetaminophen (PERCOCET/ROXICET) 5-325 MG per tablet 1 to 2 tabs PO q6hrs  PRN for pain 02/19/14   Elmyra Ricks Pisciotta, PA-C  oxyCODONE-acetaminophen (PERCOCET/ROXICET) 5-325 MG per tablet Take 1 tablet by mouth every 8 (eight) hours as needed for moderate pain or severe pain. 03/25/14   Courtney A Forcucci, PA-C  traMADol (ULTRAM) 50 MG tablet Take 1 tablet (50 mg total) by mouth every 6 (six) hours as needed (pain). 04/04/14   Tresa Garter, MD  traZODone (DESYREL) 50 MG tablet Take 1 tablet (50 mg total) by mouth at bedtime. 02/14/14   Tresa Garter, MD     Objective:   Filed Vitals:   04/04/14 1005  BP: 131/91  Pulse: 95  Temp: 97.8 F (36.6 C)  TempSrc: Oral  Resp: 17  Height: 5\' 3"  (1.6 m)  Weight: 173 lb 3.2 oz (78.563 kg)  SpO2: 98%  Exam General appearance : Awake, alert, not in any distress. Speech Clear. Not toxic looking HEENT: Atraumatic and Normocephalic, pupils equally reactive to light and accomodation Neck: supple, no JVD. No cervical lymphadenopathy.  Chest:Good air entry bilaterally, no added sounds  CVS: S1 S2 regular, no murmurs.  Abdomen: Bowel sounds present, Non tender and not distended with no gaurding, rigidity or rebound. Extremities: B/L Lower Ext shows no edema, both legs are warm to touch Neurology: Awake alert, and oriented X 3, CN II-XII intact, Non focal  Data Review No results found for this basename: HGBA1C     Assessment & Plan   1. Major  depressive disorder, recurrent episode, moderate  - FLUoxetine (PROZAC) 40 MG capsule; Take 1 capsule (40 mg total) by mouth daily.  Dispense: 90 capsule; Refill: 3 - omeprazole (PRILOSEC) 20 MG capsule; Take 2 capsules (40 mg total) by mouth daily.  Dispense: 30 capsule; Refill: 3  2. Adjustment disorder with mixed anxiety and depressed mood Increase - FLUoxetine (PROZAC) 40 MG capsule; Take 1 capsule (40 mg total) by mouth daily.  Dispense: 90 capsule; Refill: 3  3. Need for prophylactic vaccination and inoculation against influenza Flu vaccine given  4. Asthma with acute exacerbation, mild intermittent  - albuterol (PROVENTIL HFA;VENTOLIN HFA) 108 (90 BASE) MCG/ACT inhaler; Inhale 2 puffs into the lungs every 4 (four) hours as needed for wheezing or shortness of breath.  Dispense: 1 Inhaler; Refill: 1  5. Hypertriglyceridemia  - gemfibrozil (LOPID) 600 MG tablet; Take 1 tablet (600 mg total) by mouth 2 (two) times daily before a meal.  Dispense: 180 tablet; Refill: 3 - Lipid panel  6. Chronic pain syndrome  - meloxicam (MOBIC) 15 MG tablet; Take 1 tablet (15 mg total) by mouth daily.  Dispense: 30 tablet; Refill: 0 - traMADol (ULTRAM) 50 MG tablet; Take 1 tablet (50 mg total) by mouth every 6 (six) hours as needed (pain).  Dispense: 60 tablet; Refill: 0  Patient was counseled extensively about nutrition and exercise. Patient was referred to our social worker here for counseling.  Return in about 6 months (around 10/03/2014), or if symptoms worsen or fail to improve, for Generalized Anxiety Disorder, Annual Physical.  The patient was given clear instructions to go to ER or return to medical center if symptoms don't improve, worsen or new problems develop. The patient verbalized understanding. The patient was told to call to get lab results if they haven't heard anything in the next week.   This note has been created with Automotive engineer. Any transcriptional errors are unintentional.    Angelica Chessman, MD, [place], [place], [place] [facility], [place]   04/04/2014, 11:06 AM

## 2014-04-04 NOTE — Progress Notes (Signed)
Patient comes in for multiple complaints Patient complains of anxiety, nervousness, back pain,Shortness of breath, pain in right thumb, Patient states that she fell down 2 weeks ago due to spasm in her back and legs just gave out

## 2014-04-14 ENCOUNTER — Telehealth: Payer: Self-pay | Admitting: Emergency Medicine

## 2014-04-14 NOTE — Telephone Encounter (Signed)
Left message for pt to call for results  

## 2014-04-14 NOTE — Telephone Encounter (Signed)
Message copied by Ricci Barker on Fri Apr 14, 2014  5:53 PM ------      Message from: Elizabeth Dean      Created: Wed Apr 12, 2014 10:38 AM       Please inform patient that her cholesterol panel is normal ------

## 2014-05-02 ENCOUNTER — Other Ambulatory Visit: Payer: Self-pay | Admitting: *Deleted

## 2014-05-02 ENCOUNTER — Telehealth: Payer: Self-pay | Admitting: Internal Medicine

## 2014-05-02 DIAGNOSIS — G894 Chronic pain syndrome: Secondary | ICD-10-CM

## 2014-05-02 MED ORDER — TRAMADOL HCL 50 MG PO TABS: 50.0000 mg | ORAL_TABLET | Freq: Four times a day (QID) | ORAL | Status: DC | PRN

## 2014-05-02 NOTE — Progress Notes (Signed)
Pt called stating that her tooth was still giving her pain. She was due for a refill of Tramadol so I wrote the medication for her to pick up.

## 2014-05-02 NOTE — Telephone Encounter (Signed)
Pt. Called stating that she has been having tooth pain and would like a referral to see dentist or get medication to treat. Please f/u with pt.

## 2014-05-03 NOTE — Telephone Encounter (Signed)
Left voice message to return call.,

## 2014-06-01 ENCOUNTER — Other Ambulatory Visit: Payer: Self-pay

## 2014-07-13 ENCOUNTER — Emergency Department (HOSPITAL_COMMUNITY): Payer: Self-pay

## 2014-07-13 ENCOUNTER — Encounter (HOSPITAL_COMMUNITY): Payer: Self-pay | Admitting: Emergency Medicine

## 2014-07-13 ENCOUNTER — Emergency Department (HOSPITAL_COMMUNITY)
Admission: EM | Admit: 2014-07-13 | Discharge: 2014-07-13 | Disposition: A | Discharge status: Home / Self Care | Payer: Self-pay | Attending: Emergency Medicine | Admitting: Emergency Medicine

## 2014-07-13 DIAGNOSIS — J45909 Unspecified asthma, uncomplicated: Secondary | ICD-10-CM | POA: Insufficient documentation

## 2014-07-13 DIAGNOSIS — M25512 Pain in left shoulder: Secondary | ICD-10-CM

## 2014-07-13 DIAGNOSIS — Y9289 Other specified places as the place of occurrence of the external cause: Secondary | ICD-10-CM | POA: Insufficient documentation

## 2014-07-13 DIAGNOSIS — Z791 Long term (current) use of non-steroidal anti-inflammatories (NSAID): Secondary | ICD-10-CM | POA: Insufficient documentation

## 2014-07-13 DIAGNOSIS — Z720 Tobacco use: Secondary | ICD-10-CM | POA: Insufficient documentation

## 2014-07-13 DIAGNOSIS — R52 Pain, unspecified: Secondary | ICD-10-CM

## 2014-07-13 DIAGNOSIS — Y9389 Activity, other specified: Secondary | ICD-10-CM | POA: Insufficient documentation

## 2014-07-13 DIAGNOSIS — Z792 Long term (current) use of antibiotics: Secondary | ICD-10-CM | POA: Insufficient documentation

## 2014-07-13 DIAGNOSIS — S4992XA Unspecified injury of left shoulder and upper arm, initial encounter: Secondary | ICD-10-CM | POA: Insufficient documentation

## 2014-07-13 DIAGNOSIS — F410 Panic disorder [episodic paroxysmal anxiety] without agoraphobia: Secondary | ICD-10-CM | POA: Insufficient documentation

## 2014-07-13 DIAGNOSIS — F329 Major depressive disorder, single episode, unspecified: Secondary | ICD-10-CM | POA: Insufficient documentation

## 2014-07-13 DIAGNOSIS — Y998 Other external cause status: Secondary | ICD-10-CM | POA: Insufficient documentation

## 2014-07-13 DIAGNOSIS — Z79899 Other long term (current) drug therapy: Secondary | ICD-10-CM | POA: Insufficient documentation

## 2014-07-13 MED ORDER — OXYCODONE-ACETAMINOPHEN 5-325 MG PO TABS
1.0000 | ORAL_TABLET | Freq: Once | ORAL | Status: AC
  Administered 2014-07-13: 1 via ORAL
  Filled 2014-07-13: qty 1

## 2014-07-13 MED ORDER — OXYCODONE-ACETAMINOPHEN 5-325 MG PO TABS: 1.0000 | ORAL_TABLET | ORAL | Status: DC | PRN
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
## 2014-07-13 NOTE — ED Notes (Signed)
Pt verbalized understanding of prescription and pain management.

## 2014-07-13 NOTE — Discharge Instructions (Signed)
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
## 2014-10-09 ENCOUNTER — Emergency Department (HOSPITAL_COMMUNITY)
Admission: EM | Admit: 2014-10-09 | Discharge: 2014-10-09 | Disposition: A | Discharge status: Home / Self Care | Payer: Self-pay | Attending: Emergency Medicine | Admitting: Emergency Medicine

## 2014-10-09 ENCOUNTER — Encounter (HOSPITAL_COMMUNITY): Payer: Self-pay | Admitting: Family Medicine

## 2014-10-09 ENCOUNTER — Emergency Department (HOSPITAL_COMMUNITY): Payer: Self-pay

## 2014-10-09 DIAGNOSIS — M25512 Pain in left shoulder: Secondary | ICD-10-CM | POA: Insufficient documentation

## 2014-10-09 DIAGNOSIS — F329 Major depressive disorder, single episode, unspecified: Secondary | ICD-10-CM | POA: Insufficient documentation

## 2014-10-09 DIAGNOSIS — J45909 Unspecified asthma, uncomplicated: Secondary | ICD-10-CM | POA: Insufficient documentation

## 2014-10-09 DIAGNOSIS — Z79899 Other long term (current) drug therapy: Secondary | ICD-10-CM | POA: Insufficient documentation

## 2014-10-09 DIAGNOSIS — Z720 Tobacco use: Secondary | ICD-10-CM | POA: Insufficient documentation

## 2014-10-09 DIAGNOSIS — Z791 Long term (current) use of non-steroidal anti-inflammatories (NSAID): Secondary | ICD-10-CM | POA: Insufficient documentation

## 2014-10-09 DIAGNOSIS — F410 Panic disorder [episodic paroxysmal anxiety] without agoraphobia: Secondary | ICD-10-CM | POA: Insufficient documentation

## 2014-10-09 MED ORDER — KETOROLAC TROMETHAMINE 60 MG/2ML IM SOLN
60.0000 mg | Freq: Once | INTRAMUSCULAR | Status: AC
  Administered 2014-10-09: 60 mg via INTRAMUSCULAR
  Filled 2014-10-09: qty 2

## 2014-10-09 NOTE — ED Notes (Signed)
Declined W/C at D/C and was escorted to lobby by RN. 

## 2014-10-09 NOTE — ED Notes (Signed)
Pt sts left shoulder pain since yesterday. 

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
## 2014-10-09 NOTE — Discharge Instructions (Signed)
Return to the emergency room with worsening of symptoms, new symptoms or with symptoms that are concerning, especially fevers, redness, swelling, unable to move your arm, numbness, tingling, weakness. RICE: Rest, Ice (three cycles of 20 mins on, 71mins off at least twice a day), compression/brace, elevation. Heating pad works well for back pain. Ibuprofen 400mg  (2 tablets 200mg ) every 5-6 hours for 3-5 days. Wear sling as needed for comfort. Make sure to move your arm every hour. Follow up with PCP/orthopedist if symptoms worsen or are persistent. Read below information and follow recommendations.   Adhesive Capsulitis Sometimes the shoulder becomes stiff and is painful to move. Some people say it feels as if the shoulder is frozen in place. Because of this, the condition is called "frozen shoulder." Its medical name is adhesive capsulitis.  The shoulder joint is made up of strong connective tissue that attaches the ball of the humerus to the shallow shoulder socket. This strong connective tissue is called the joint capsule. This tissue can become stiff and swollen. That is when adhesive capsulitis sets in. CAUSES  It is not always clear just what the cause adhesive capsulitis. Possibilities include:  Injury to the shoulder joint.  Strain. This is a repetitive injury brought about by overuse.  Lack of use. Perhaps your arm or hand was otherwise injured. It might have been in a sling for awhile. Or perhaps you were not using it to avoid pain.  Referred pain. This is a sort of trick the body plays. You feel pain in the shoulder. But, the pain actually comes from an injury somewhere else in the body.  Long-standing health problems. Several diseases can cause adhesive capsulitis. They include diabetes, heart disease, stroke, thyroid problems, rheumatoid arthritis and lung disease.  Being a women older than 95. Anyone can develop adhesive capsulitis but it is most common in women in this age  group. SYMPTOMS   Pain.  It occurs when the arm is moved.  Parts of the shoulder might hurt if they are touched.  Pain is worse at night or when resting.  Soreness. It might not be strong enough to be called pain. But, the shoulder aches.  The shoulder does not move freely.  Muscle spasms.  Trouble sleeping because of shoulder ache or pain. DIAGNOSIS  To decide if you have adhesive capsulitis, your healthcare provider will probably:  Ask about symptoms you have noticed.  Ask about your history of joint pain and anything that might have caused the pain.  Ask about your overall health.  Use hands to feel your shoulder and neck.  Ask you to move your shoulder in specific directions. This may indicate the origin of the pain.  Order imaging tests; pictures of the shoulder. They help pinpoint the source of the problem. An X-ray might be used. For more detail, an MRI is often used. An MRI details the tendons, muscles and ligaments as well as the joint. TREATMENT  Adhesive capsulitis can be treated several ways. Most treatments can be done in a clinic or in your healthcare provider's office. Be sure to discuss the different options with your caregiver. They include:  Physical therapy. You will work on specific exercises to get your shoulder moving again. The exercises usually involve stretching. A physical therapist (a caregiver with special training) can show you what to do and what not to do. The exercises will need to be done daily.  Medication.  Over-the-counter medicines may relieve pain and inflammation (the body's way of reacting  to injury or infection).  Corticosteroids. These are stronger drugs to reduce pain and inflammation. They are given by injection (shots) into the shoulder joint. Frequent treatment is not recommended.  Muscle relaxants. Medication may be prescribed to ease muscle spasms.  Treatment of underlying conditions. This means treating another condition  that is causing your shoulder problem. This might be a rotator cuff (tendon) problem  Shoulder manipulation. The shoulder will be moved by your healthcare provider. You would be under general anesthesia (given a drug that puts you to sleep). You would not feel anything. Sometimes the joint will be injected with salt water (saline) at high pressure to break down internal scarring in the joint capsule.  Surgery. This is rarely needed. It may be suggested in advanced cases after all other treatment has failed. PROGNOSIS  In time, most people recover from adhesive capsulitis. Sometimes, however, the pain goes away but full movement of the shoulder does not return.  HOME CARE INSTRUCTIONS   Take any pain medications recommended by your healthcare provider. Follow the directions carefully.  If you have physical therapy, follow through with the therapist's suggestions. Be sure you understand the exercises you will be doing. You should understand:  How often the exercises should be done.  How many times each exercise should be repeated.  How long they should be done.  What other activities you should do, or not do.  That you should warm up before doing any exercise. Just 5 to 10 minutes will help. Small, gentle movements should get your shoulder ready for more.  Avoid high-demand exercise that involves your shoulder such as throwing. This type of exercise can make pain worse.  Consider using cold packs. Cold may ease swelling and pain. Ask your healthcare provider if a cold pack might help you. If so, get directions on how and when to use them. SEEK MEDICAL CARE IF:   You have any questions about your medications.  Your pain continues to increase. Document Released: 05/04/2009 Document Revised: 09/29/2011 Document Reviewed: 05/04/2009 [facility] Patient Information 2015 [place], [place]. This information is not intended to replace advice given to you by your health care provider. Make sure you  discuss any questions you have with your health care provider.  Arthralgia Your caregiver has diagnosed you as suffering from an arthralgia. Arthralgia means there is pain in a joint. This can come from many reasons including:  Bruising the joint which causes soreness (inflammation) in the joint.  Wear and tear on the joints which occur as we grow older (osteoarthritis).  Overusing the joint.  Various forms of arthritis.  Infections of the joint. Regardless of the cause of pain in your joint, most of these different pains respond to anti-inflammatory drugs and rest. The exception to this is when a joint is infected, and these cases are treated with antibiotics, if it is a bacterial infection. HOME CARE INSTRUCTIONS   Rest the injured area for as long as directed by your caregiver. Then slowly start using the joint as directed by your caregiver and as the pain allows. Crutches as directed may be useful if the ankles, knees or hips are involved. If the knee was splinted or casted, continue use and care as directed. If an stretchy or elastic wrapping bandage has been applied today, it should be removed and re-applied every 3 to 4 hours. It should not be applied tightly, but firmly enough to keep swelling down. Watch toes and feet for swelling, bluish discoloration, coldness, numbness or excessive  pain. If any of these problems (symptoms) occur, remove the ace bandage and re-apply more loosely. If these symptoms persist, contact your caregiver or return to this location.  For the first 24 hours, keep the injured extremity elevated on pillows while lying down.  Apply ice for 15-20 minutes to the sore joint every couple hours while awake for the first half day. Then 03-04 times per day for the first 48 hours. Put the ice in a plastic bag and place a towel between the bag of ice and your skin.  Wear any splinting, casting, elastic bandage applications, or slings as instructed.  Only take  over-the-counter or prescription medicines for pain, discomfort, or fever as directed by your caregiver. Do not use aspirin immediately after the injury unless instructed by your physician. Aspirin can cause increased bleeding and bruising of the tissues.  If you were given crutches, continue to use them as instructed and do not resume weight bearing on the sore joint until instructed. Persistent pain and inability to use the sore joint as directed for more than 2 to 3 days are warning signs indicating that you should see a caregiver for a follow-up visit as soon as possible. Initially, a hairline fracture (break in bone) may not be evident on X-rays. Persistent pain and swelling indicate that further evaluation, non-weight bearing or use of the joint (use of crutches or slings as instructed), or further X-rays are indicated. X-rays may sometimes not show a small fracture until a week or 10 days later. Make a follow-up appointment with your own caregiver or one to whom we have referred you. A radiologist (specialist in reading X-rays) may read your X-rays. Make sure you know how you are to obtain your X-ray results. Do not assume everything is normal if you do not hear from [place]. SEEK MEDICAL CARE IF: Bruising, swelling, or pain increases. SEEK IMMEDIATE MEDICAL CARE IF:   Your fingers or toes are numb or blue.  The pain is not responding to medications and continues to stay the same or get worse.  The pain in your joint becomes severe.  You develop a fever over 102 F (38.9 C).  It becomes impossible to move or use the joint. MAKE SURE YOU:   Understand these instructions.  Will watch your condition.  Will get help right away if you are not doing well or get worse. Document Released: 07/07/2005 Document Revised: 09/29/2011 Document Reviewed: 02/23/2008 [facility] Patient Information 2015 [place], [place]. This information is not intended to replace advice given to you by your health care  provider. Make sure you discuss any questions you have with your health care provider.

## 2014-12-21 ENCOUNTER — Encounter (HOSPITAL_COMMUNITY): Payer: Self-pay

## 2014-12-21 ENCOUNTER — Emergency Department (HOSPITAL_COMMUNITY)
Admission: EM | Admit: 2014-12-21 | Discharge: 2014-12-21 | Disposition: A | Discharge status: Home / Self Care | Payer: Self-pay | Attending: Emergency Medicine | Admitting: Emergency Medicine

## 2014-12-21 DIAGNOSIS — Z720 Tobacco use: Secondary | ICD-10-CM | POA: Insufficient documentation

## 2014-12-21 DIAGNOSIS — Z3202 Encounter for pregnancy test, result negative: Secondary | ICD-10-CM | POA: Insufficient documentation

## 2014-12-21 DIAGNOSIS — Z711 Person with feared health complaint in whom no diagnosis is made: Secondary | ICD-10-CM | POA: Insufficient documentation

## 2014-12-21 DIAGNOSIS — F329 Major depressive disorder, single episode, unspecified: Secondary | ICD-10-CM | POA: Insufficient documentation

## 2014-12-21 DIAGNOSIS — R197 Diarrhea, unspecified: Secondary | ICD-10-CM | POA: Insufficient documentation

## 2014-12-21 DIAGNOSIS — Z792 Long term (current) use of antibiotics: Secondary | ICD-10-CM | POA: Insufficient documentation

## 2014-12-21 DIAGNOSIS — R1110 Vomiting, unspecified: Secondary | ICD-10-CM | POA: Insufficient documentation

## 2014-12-21 DIAGNOSIS — M549 Dorsalgia, unspecified: Secondary | ICD-10-CM | POA: Insufficient documentation

## 2014-12-21 DIAGNOSIS — J45909 Unspecified asthma, uncomplicated: Secondary | ICD-10-CM | POA: Insufficient documentation

## 2014-12-21 DIAGNOSIS — Z79899 Other long term (current) drug therapy: Secondary | ICD-10-CM | POA: Insufficient documentation

## 2014-12-21 DIAGNOSIS — F410 Panic disorder [episodic paroxysmal anxiety] without agoraphobia: Secondary | ICD-10-CM | POA: Insufficient documentation

## 2014-12-21 LAB — PREGNANCY, URINE: PREG TEST UR: NEGATIVE

## 2014-12-21 NOTE — ED Notes (Addendum)
Pt here for pregnancy test. Has been eating a lot more than normal she states. Has been hotter than usual. Nausea in the mornings and then goes away. Hasn't had a period since April.

## 2014-12-21 NOTE — Discharge Instructions (Signed)
°Emergency Department Resource Guide °1) Find a Doctor and Pay Out of Pocket °Although you won't have to find out who is covered by your insurance plan, it is a good idea to ask around and get recommendations. You will then need to call the office and see if the doctor you have chosen will accept you as a new patient and what types of options they offer for patients who are self-pay. Some doctors offer discounts or will set up payment plans for their patients who do not have insurance, but you will need to ask so you aren't surprised when you get to your appointment. ° °2) Contact Your Local Health Department °Not all health departments have doctors that can see patients for sick visits, but many do, so it is worth a call to see if yours does. If you don't know where your local health department is, you can check in your phone book. The CDC also has a tool to help you locate your state's health department, and many state websites also have listings of all of their local health departments. ° °3) Find a Walk-in Clinic °If your illness is not likely to be very severe or complicated, you may want to try a walk in clinic. These are popping up all over the country in pharmacies, drugstores, and shopping centers. They're usually staffed by nurse practitioners or physician assistants that have been trained to treat common illnesses and complaints. They're usually fairly quick and inexpensive. However, if you have serious medical issues or chronic medical problems, these are probably not your best option. ° °No Primary Care Doctor: °- Call [facility] at  832-8000 - they can help you locate a primary care doctor that  accepts your insurance, provides certain services, etc. °- Physician Referral Service- 1-800-533-3463 ° °Chronic Pain Problems: °Organization         Address  Phone   Notes  °[facility]  (336) 297-2271 Patients need to be referred by their primary care doctor.  ° °Medication  Assistance: °Organization         Address  Phone   Notes  °[place] [facility] [address] °[place], [place] [address] (336) 641-8030 --Must be a resident of [place] °-- Must have NO insurance coverage whatsoever (no Medicaid/ Medicare, etc.) °-- The pt. MUST have a primary care doctor that directs their care regularly and follows them in the community °  °[facility]  (866) 331-1348   °[employer]  (888) 892-1162   ° °Agencies that provide inexpensive medical care: °Organization         Address  Phone   Notes  ° [facility]  (336) 832-8035   °[facility]    (336) 832-7272   °[facility] [address] °[place], [place] [address] (336) 832-4777   °[facility] [address], °[place] (336) 271-4999   °[facility]    (336) 373-0678   °[facility]    (336) 272-1050   °[facility] ° [address], [place] Phone:  (336) 832-4444, Fax:  (336) 832-4440 Hours of Operation:  9 am - 6 pm, M-F.  Also accepts Medicaid/Medicare and self-pay.  °[facility] ° [address], [place] Phone: (336) 832-3150, Fax: (336) [phone]. Hours of Operation:  8:30 am - 5:30 pm, M-F.  Also accepts Medicaid and self-pay.  °[facility] [place] [address], [place] Phone: (336) 878-6027   °[facility] [address], [place], [place] (336)723-1848, Ext. 123 Mondays & Thursdays: 7-9 AM.  First 15 patients are seen on a first come, first serve basis. °  ° °Medicaid-accepting [place] Providers: ° °Organization         Address  Phone   Notes  °[facility] [address], [place] (336) 641-2100 Also accepts self-pay patients.  °[facility] [address], [place] ° (336) 856-9996   °[facility] [address], [place]  (336) 288-8857   °[facility] [address] [place] [address], [place] (336) 299-7000   °Veita Bland [address], [place]  ° (336) 373-1557 Only accepts [place] Access Medicaid patients after they have their name applied to their card.  ° °Self-Pay (no insurance) in [place]: ° °Organization         Address  Phone   Notes  °Sickle Cell Patients, [facility] [address], [place] (336) 832-1970   °[facility] [address], [place] (336) 832-4400   °[facility] ° [address], [place] (336) 992-4800   °[facility]/Dr. Osei-Bonsu ° [address] [place] [address], [place] or [address], [place] (336) 841-8500 Phone number for both [place] and [place] locations is the same.  °[facility] [address], [place] (336) 299-0000   °[facility] [address] [place] [address], [place] or [address] (336) 852-7530 °(336) 878-2260   °[facility] [address], [place] (336) 350-1642, phone; (336) 294-5005, fax Sees patients 1st and 3rd Saturday of every month.  Must not qualify for public or private insurance (i.e. Medicaid, Medicare, [place] Health Choice, Veterans' Benefits) • Household income should be no more than 200% of the poverty level •The clinic cannot treat you if you are pregnant or think you are pregnant • Sexually transmitted diseases are not treated at the clinic.  ° ° °Dental Care: °Organization         Address  Phone  Notes  °[place] [facility] [address], [place] (336) 641-6152 Accepts children up to age 21 who are enrolled in Medicaid or [place] Health Choice; pregnant women with a Medicaid card; and children who have applied for Medicaid or [place] Health Choice, but were declined, whose parents can pay a reduced fee at time of service.  °[place]  [facility] [place]  [address], [place] (336) 641-7733 Accepts children up to age 21 who are enrolled in Medicaid or [place] Health Choice; pregnant women with a Medicaid card; and children who have applied for Medicaid or  Health Choice, but were declined, whose parents can pay a reduced fee at time of service.  °[facility] ° [address],  (336) 641-4533 Patients are seen by appointment only. Walk-ins are not accepted. [facility] will see patients 18 years of age and older. °Monday - Tuesday (8am-5pm) °Most Wednesdays (8:30-5pm) °$30 per visit, cash only  °[facility] ° [address], [place] (336) 641-4533 Patients are seen by appointment only. Walk-ins are not accepted. [facility] will see patients 18 years of age and older. °One   Wednesday Evening (Monthly: Volunteer Based).  $30 per visit, cash only  °[facility]  (919) 537-3737 for adults; Children under age 4, call Graduate Pediatric Dentistry at ([phone]) [phone]. Children aged 4-14, please call (919) [phone] to request a pediatric application. ° Dental services are provided in all areas of dental care including fillings, crowns and bridges, complete and partial dentures, implants, gum treatment, root canals, and extractions. Preventive care is also provided. Treatment is provided to both adults and children. °Patients are selected via a lottery and there is often a waiting list. °  °[facility] [address], °[place] ° (336) 763-8833 [email or web address] °  °[facility] [address], [place], [place] (336)723-1848, Ext. 123 Second and Fourth Thursday of each month, opens at 6:30 AM; Clinic ends at 9 AM.  Patients are seen on a first-come first-served basis, and a limited number are seen during each clinic.  ° °[facility] ° [address], [place], [place] (336) 723-7904    Eligibility Requirements °You must have lived in [place], or [place] counties for at least the last three months. °  You cannot be eligible for state or federal sponsored healthcare insurance, including Veterans Administration, Medicaid, or Medicare. °  You generally cannot be eligible for healthcare insurance through your employer.  °  How to apply: °Eligibility screenings are held every Tuesday and Wednesday afternoon from 1:00 pm until 4:00 pm. You do not need an appointment for the interview!  °[facility] [address], [place], [place] 336-631-2330   °[facility] [facility]  336-342-8273   °[facility]  336-703-3100   °[facility]  336-570-6415   ° °Behavioral Health Resources in the Community: °Intensive Outpatient Programs °Organization         Address  Phone  Notes  °[facility] [address], [place], [place] 336-878-6098   °[facility] [address], [place], [place] 336-832-9800   °[facility] [address], [place], [place] ° 336-882-2125   °[facility] [address],  °[place], [place] 1-800-853-5163 or 336-641-4981   °Substance Abuse Resources °Organization         Address  Phone  Notes  °[facility]  336-882-2125   °[facility]  336-784-9470   °[facility]  336-285-9073   °[facility]  336-845-3988   °Residential & Outpatient Substance Abuse Program  1-800-659-3381   °Psychological Services °Organization         Address  Phone  Notes  °[facility]  336- 832-9600   °[facility]  336- 378-7881   °[facility] [address], [place] 1-800-853-5163 or 336-641-4981   ° °Mobile Crisis Teams °Organization         Address  Phone  Notes  °[facility], Mobile Crisis Care Unit  1-877-626-1772   °Assertive °[facility] ° [address]  [place], [place] 336-834-9664   °Sharon DeEsch [address] °[place] 336-554-5454   ° °Self-Help/Support Groups °Organization         Address  Phone             Notes  °[facility] - variety of support groups  336- 373-1402 Call for more information  °Narcotics Anonymous (NA), [facility] [address], °[place]  2 meetings at this location  ° °  Residential Treatment Programs °Organization         Address  Phone  Notes  °[facility] [address],    °[place]  1-866-801-8205   °[facility] ° [address], [place], [place] 704-293-8524   °[facility] [address], [place] 336-845-3988 Admissions: 8am-3pm M-F  °[facility] [address]    °[place], [place] 336-841-1104   °[facility] [address], [place], [place] 336-379-7146   °[facility] [address]  °[place], [place] 336-285-9073   °[facility] - Intensive Outpatient [address], [place], [place] 336-852-3033   °[facility] [address]  °[place], [place] 1-877-615-2722 or 336-784-9470   °[facility] [address] [place], [place] 336-227-7417 Accepts Medicaid  °[facility] [address]  °[place] 1-800-659-3381 Substance Abuse/Addiction Treatment  ° °[place] Behavioral Health Resources °Organization         Address  Phone  Notes  °[facility]  (888) 581-9988   °Julie Brannon, PhD [address] [place], [place]   (336) 349-5553 or (336) 951-0000   °[facility]   [address] °[place], [place] (336) 349-4454   °[facility] [address], [place], [place] (336) 342-8316 Insurance/Medicaid/sponsorship through [facility]  °[facility] [address]                                    [place], [place] (336) 342-8316 Therapy/tele-psych/case    °[facility] [address]  ° [place], [place] (336) 349-2233    °Dr. Arfeen  (336) 349-4544   °[facility] [place]  [employer] [place] [facility] 1) [address], [place] °2) [address], [place] °3)  [address], [place] (336) 349-3220 °(336) 342-7768 ° °(336) 342-8140   °[place] Child Abuse [facility] (336) 342-1394 or (336) 342-3537 (After Hours)    ° ° °

## 2014-12-21 NOTE — ED Notes (Signed)
Declined W/C at D/C and was escorted to lobby by RN. 

## 2014-12-21 NOTE — ED Provider Notes (Signed)
CSN: [id number]     Arrival date & time 12/21/14  1241 History  This chart was scribed for non-physician practitioner Domenic Moras, PA-C, working with Lajean Saver, MD by Meriel Pica, ED Scribe. This patient was seen in room TR08C/TR08C and the patient's care was started at 1:47 PM.    Chief Complaint  Patient presents with  . Possible Pregnancy   The history is provided by the patient. No language interpreter was used.   HPI Comments: Elizabeth Dean is a 34 y.o. female who presents to the Emergency Department complaining of episodes of nausea occuring in the morning onset 7 days ago. Pt reports that her last normal cycle was 4/22. She also notes associated diarrhea and lower back pain that she describes as a soreness. Pt denies fever, vomiting, chills, SOB, dyspnea, vaginal discharge, or vaginal bleeding. She is not followed by a PCP.  G:4 P:4 A:0. Patient is here concerning about possible pregnancy. She did not take a pregnancy test at home. She has no other complaint. She reports that her back pain is minimal and does not bother her. She has no abdominal pain.    Past Medical History  Diagnosis Date  . Asthma   . Bronchitis   . Anxiety   . Panic attacks   . Depression    Past Surgical History  Procedure Laterality Date  . Tubal ligation    . Cholecystectomy     Family History  Problem Relation Age of Onset  . Heart disease Maternal Grandmother   . Hypertension Maternal Grandmother    History  Substance Use Topics  . Smoking status: Current Every Day Smoker -- 0.50 packs/day    Types: Cigarettes  . Smokeless tobacco: Not on file  . Alcohol Use: 1.2 oz/week    2 Cans of beer per week     Comment: socially   OB History    No data available     Review of Systems  Constitutional: Negative for fever and chills.  Respiratory: Negative for shortness of breath.   Gastrointestinal: Positive for nausea and diarrhea. Negative for vomiting and abdominal pain.  Genitourinary:  Negative for vaginal bleeding and vaginal discharge.  Musculoskeletal: Positive for back pain.      Allergies  Hydrocodone and Codeine  Home Medications   Prior to Admission medications   Medication Sig Start Date End Date Taking? Authorizing Provider  albuterol (PROVENTIL HFA;VENTOLIN HFA) 108 (90 BASE) MCG/ACT inhaler Inhale 2 puffs into the lungs every 4 (four) hours as needed for wheezing or shortness of breath. 04/04/14   Tresa Garter, MD  amoxicillin (AMOXIL) 500 MG capsule Take 1 capsule (500 mg total) by mouth 3 (three) times daily. 02/19/14   Nicole Pisciotta, PA-C  amoxicillin (AMOXIL) 500 MG capsule Take 1 capsule (500 mg total) by mouth 3 (three) times daily. 03/25/14   Courtney Forcucci, PA-C  FLUoxetine (PROZAC) 40 MG capsule Take 1 capsule (40 mg total) by mouth daily. 04/04/14   Tresa Garter, MD  gemfibrozil (LOPID) 600 MG tablet Take 1 tablet (600 mg total) by mouth 2 (two) times daily before a meal. 04/04/14   Tresa Garter, MD  meloxicam (MOBIC) 15 MG tablet Take 1 tablet (15 mg total) by mouth daily. 04/04/14   Tresa Garter, MD  omeprazole (PRILOSEC) 20 MG capsule Take 2 capsules (40 mg total) by mouth daily. 04/04/14   Tresa Garter, MD  oxyCODONE-acetaminophen (PERCOCET/ROXICET) 5-325 MG per tablet Take 1-2 tablets by mouth every  4 (four) hours as needed for moderate pain or severe pain. 07/13/14   Glendell Docker, NP  traMADol (ULTRAM) 50 MG tablet Take 1 tablet (50 mg total) by mouth every 6 (six) hours as needed (pain). 05/02/14   Tresa Garter, MD  traZODone (DESYREL) 50 MG tablet Take 1 tablet (50 mg total) by mouth at bedtime. 02/14/14   Tresa Garter, MD   BP 115/75 mmHg  Pulse 104  Temp(Src) 98.7 F (37.1 C) (Oral)  Resp 16  Ht 4\' 9"  (1.448 m)  Wt 164 lb 14.4 oz (74.798 kg)  BMI 35.67 kg/m2  SpO2 98%  LMP 11/12/2014   Physical Exam  Constitutional: She appears well-developed and well-nourished.  HENT:  Head:  Normocephalic and atraumatic.  Eyes: Conjunctivae are normal. Right eye exhibits no discharge. Left eye exhibits no discharge.  Cardiovascular: Normal rate, regular rhythm and normal heart sounds.   Pulmonary/Chest: Effort normal and breath sounds normal. No respiratory distress.  Abdominal: Soft. Bowel sounds are normal. There is no tenderness.  Musculoskeletal: She exhibits no tenderness.  Neurological: She is alert. Coordination normal.  Skin: Skin is warm and dry. No rash noted. She is not diaphoretic. No erythema.  Psychiatric: She has a normal mood and affect.  Nursing note and vitals reviewed.   ED Course  Procedures  DIAGNOSTIC STUDIES: Oxygen Saturation is 98% on RA, normal by my interpretation.    COORDINATION OF CARE: 1:49 PM Discussed with pt that pregnancy test is negative and will give referral to establish primary care. Pt acknowledges and agrees to plan.   Labs Review Labs Reviewed  PREGNANCY, URINE    Imaging Review No results found.   EKG Interpretation None      MDM   Final diagnoses:  Physically well but worried   BP 115/75 mmHg  Pulse 104  Temp(Src) 98.7 F (37.1 C) (Oral)  Resp 16  Ht 4\' 9"  (1.448 m)  Wt 164 lb 14.4 oz (74.798 kg)  BMI 35.67 kg/m2  SpO2 98%  LMP 11/12/2014   I personally performed the services described in this documentation, which was scribed in my presence. The recorded information has been reviewed and is accurate.    Domenic Moras, PA-C 12/21/14 [place], MD 12/22/14 [phone]

## 2014-12-21 NOTE — ED Notes (Signed)
LMP 4/22. States has had nausea in the mornings. Pt has 4 children.

## 2015-01-04 ENCOUNTER — Ambulatory Visit: Payer: Self-pay | Admitting: Internal Medicine

## 2015-04-28 ENCOUNTER — Emergency Department (HOSPITAL_COMMUNITY)
Admission: EM | Admit: 2015-04-28 | Discharge: 2015-04-28 | Disposition: A | Discharge status: Home / Self Care | Payer: Self-pay | Attending: Emergency Medicine | Admitting: Emergency Medicine

## 2015-04-28 ENCOUNTER — Encounter (HOSPITAL_COMMUNITY): Payer: Self-pay | Admitting: Emergency Medicine

## 2015-04-28 DIAGNOSIS — Y9289 Other specified places as the place of occurrence of the external cause: Secondary | ICD-10-CM | POA: Insufficient documentation

## 2015-04-28 DIAGNOSIS — F410 Panic disorder [episodic paroxysmal anxiety] without agoraphobia: Secondary | ICD-10-CM | POA: Insufficient documentation

## 2015-04-28 DIAGNOSIS — Y9389 Activity, other specified: Secondary | ICD-10-CM | POA: Insufficient documentation

## 2015-04-28 DIAGNOSIS — F329 Major depressive disorder, single episode, unspecified: Secondary | ICD-10-CM | POA: Insufficient documentation

## 2015-04-28 DIAGNOSIS — Z792 Long term (current) use of antibiotics: Secondary | ICD-10-CM | POA: Insufficient documentation

## 2015-04-28 DIAGNOSIS — S01511A Laceration without foreign body of lip, initial encounter: Secondary | ICD-10-CM | POA: Insufficient documentation

## 2015-04-28 DIAGNOSIS — J45909 Unspecified asthma, uncomplicated: Secondary | ICD-10-CM | POA: Insufficient documentation

## 2015-04-28 DIAGNOSIS — Y998 Other external cause status: Secondary | ICD-10-CM | POA: Insufficient documentation

## 2015-04-28 DIAGNOSIS — Z791 Long term (current) use of non-steroidal anti-inflammatories (NSAID): Secondary | ICD-10-CM | POA: Insufficient documentation

## 2015-04-28 DIAGNOSIS — Z720 Tobacco use: Secondary | ICD-10-CM | POA: Insufficient documentation

## 2015-04-28 DIAGNOSIS — Z79899 Other long term (current) drug therapy: Secondary | ICD-10-CM | POA: Insufficient documentation

## 2015-04-28 MED ORDER — AMOXICILLIN-POT CLAVULANATE 875-125 MG PO TABS
1.0000 | ORAL_TABLET | Freq: Once | ORAL | Status: AC
  Administered 2015-04-28: 1 via ORAL
  Filled 2015-04-28: qty 1

## 2015-04-28 MED ORDER — AMOXICILLIN-POT CLAVULANATE 875-125 MG PO TABS: 1.0000 | ORAL_TABLET | Freq: Two times a day (BID) | ORAL | Status: DC

## 2015-04-28 MED ORDER — TRAMADOL HCL 50 MG PO TABS: 50.0000 mg | ORAL_TABLET | Freq: Four times a day (QID) | ORAL | Status: DC | PRN

## 2015-04-28 MED ORDER — TRAMADOL HCL 50 MG PO TABS
50.0000 mg | ORAL_TABLET | Freq: Once | ORAL | Status: AC
  Administered 2015-04-28: 50 mg via ORAL
  Filled 2015-04-28: qty 1

## 2015-04-28 MED ORDER — LIDOCAINE-EPINEPHRINE (PF) 2 %-1:200000 IJ SOLN
10.0000 mL | Freq: Once | INTRAMUSCULAR | Status: AC
  Administered 2015-04-28: 10 mL
  Filled 2015-04-28: qty 20

## 2015-04-28 NOTE — Discharge Instructions (Signed)
Return in 5 to 7 days for suture removal or sooner for any signs of infection.
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
## 2015-04-30 ENCOUNTER — Encounter (HOSPITAL_COMMUNITY): Payer: Self-pay | Admitting: *Deleted

## 2015-04-30 ENCOUNTER — Emergency Department (HOSPITAL_COMMUNITY)
Admission: EM | Admit: 2015-04-30 | Discharge: 2015-04-30 | Disposition: A | Discharge status: Home / Self Care | Payer: Self-pay | Attending: Emergency Medicine | Admitting: Emergency Medicine

## 2015-04-30 DIAGNOSIS — Z5189 Encounter for other specified aftercare: Secondary | ICD-10-CM

## 2015-04-30 DIAGNOSIS — Z792 Long term (current) use of antibiotics: Secondary | ICD-10-CM | POA: Insufficient documentation

## 2015-04-30 DIAGNOSIS — F419 Anxiety disorder, unspecified: Secondary | ICD-10-CM | POA: Insufficient documentation

## 2015-04-30 DIAGNOSIS — Z87828 Personal history of other (healed) physical injury and trauma: Secondary | ICD-10-CM | POA: Insufficient documentation

## 2015-04-30 DIAGNOSIS — F329 Major depressive disorder, single episode, unspecified: Secondary | ICD-10-CM | POA: Insufficient documentation

## 2015-04-30 DIAGNOSIS — J45909 Unspecified asthma, uncomplicated: Secondary | ICD-10-CM | POA: Insufficient documentation

## 2015-04-30 DIAGNOSIS — F410 Panic disorder [episodic paroxysmal anxiety] without agoraphobia: Secondary | ICD-10-CM | POA: Insufficient documentation

## 2015-04-30 DIAGNOSIS — Z720 Tobacco use: Secondary | ICD-10-CM | POA: Insufficient documentation

## 2015-04-30 DIAGNOSIS — Z4801 Encounter for change or removal of surgical wound dressing: Secondary | ICD-10-CM | POA: Insufficient documentation

## 2015-04-30 DIAGNOSIS — Z79899 Other long term (current) drug therapy: Secondary | ICD-10-CM | POA: Insufficient documentation

## 2015-04-30 MED ORDER — OXYCODONE-ACETAMINOPHEN 5-325 MG PO TABS: 1.0000 | ORAL_TABLET | Freq: Four times a day (QID) | ORAL | Status: DC | PRN

## 2015-04-30 MED ORDER — OXYCODONE-ACETAMINOPHEN 5-325 MG PO TABS
1.0000 | ORAL_TABLET | Freq: Once | ORAL | Status: AC
  Administered 2015-04-30: 1 via ORAL
  Filled 2015-04-30: qty 1

## 2015-04-30 NOTE — ED Notes (Signed)
Declined W/C at D/C and was escorted to lobby by RN. 

## 2015-04-30 NOTE — Discharge Instructions (Signed)
Continue to use salt water rinses, take ibuprofen in addition to the pain medication and continue the antibiotic. Return next week for suture removal or sooner for problems.
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
## 2015-04-30 NOTE — ED Notes (Signed)
Pt returns today with pain at suture site on Lt side of lip. Sutures are in place .

## 2015-05-16 ENCOUNTER — Emergency Department (HOSPITAL_COMMUNITY)
Admission: EM | Admit: 2015-05-16 | Discharge: 2015-05-16 | Disposition: A | Discharge status: Home / Self Care | Payer: Self-pay | Attending: Emergency Medicine | Admitting: Emergency Medicine

## 2015-05-16 ENCOUNTER — Encounter (HOSPITAL_COMMUNITY): Payer: Self-pay | Admitting: *Deleted

## 2015-05-16 DIAGNOSIS — F410 Panic disorder [episodic paroxysmal anxiety] without agoraphobia: Secondary | ICD-10-CM | POA: Insufficient documentation

## 2015-05-16 DIAGNOSIS — Z792 Long term (current) use of antibiotics: Secondary | ICD-10-CM | POA: Insufficient documentation

## 2015-05-16 DIAGNOSIS — F17210 Nicotine dependence, cigarettes, uncomplicated: Secondary | ICD-10-CM | POA: Insufficient documentation

## 2015-05-16 DIAGNOSIS — Z4802 Encounter for removal of sutures: Secondary | ICD-10-CM | POA: Insufficient documentation

## 2015-05-16 DIAGNOSIS — F329 Major depressive disorder, single episode, unspecified: Secondary | ICD-10-CM | POA: Insufficient documentation

## 2015-05-16 DIAGNOSIS — N898 Other specified noninflammatory disorders of vagina: Secondary | ICD-10-CM | POA: Insufficient documentation

## 2015-05-16 DIAGNOSIS — Z79899 Other long term (current) drug therapy: Secondary | ICD-10-CM | POA: Insufficient documentation

## 2015-05-16 DIAGNOSIS — J45909 Unspecified asthma, uncomplicated: Secondary | ICD-10-CM | POA: Insufficient documentation

## 2015-05-16 LAB — WET PREP, GENITAL
CLUE CELLS WET PREP: NONE SEEN
TRICH WET PREP: NONE SEEN
YEAST WET PREP: NONE SEEN

## 2015-05-16 MED ORDER — CEFTRIAXONE SODIUM 250 MG IJ SOLR
250.0000 mg | Freq: Once | INTRAMUSCULAR | Status: AC
  Administered 2015-05-16: 250 mg via INTRAMUSCULAR
  Filled 2015-05-16: qty 250

## 2015-05-16 MED ORDER — AZITHROMYCIN 250 MG PO TABS
1000.0000 mg | ORAL_TABLET | Freq: Once | ORAL | Status: AC
  Administered 2015-05-16: 1000 mg via ORAL
  Filled 2015-05-16: qty 4

## 2015-05-16 NOTE — Discharge Instructions (Signed)
Lip care after suture removal: it's important that you use sunscreen/lip balm with sun protection over the scar where your stitches were for the next 1 year in order to ensure good scar tissue formation.   Vaginal discharge: stop using the feminine spray you used before, avoid harsh soaps or detergents. You have been treated for gonorrhea and chlamydia in the ER but the hospital will call you if lab is positive. You were tested for HIV and Syphilis, and the hospital will call you if the lab is positive. Always use condoms when engaging in sexual intercourse. Follow up with your regular doctor as needed for future STD concerns, or for ongoing symptoms.   Suture Removal, Care After Refer to this sheet in the next few weeks. These instructions provide you with information on caring for yourself after your procedure. Your health care provider may also give you more specific instructions. Your treatment has been planned according to current medical practices, but problems sometimes occur. Call your health care provider if you have any problems or questions after your procedure. WHAT TO EXPECT AFTER THE PROCEDURE After your stitches (sutures) are removed, it is typical to have the following:  Some discomfort and swelling in the wound area.  Slight redness in the area. HOME CARE INSTRUCTIONS   If you have skin adhesive strips over the wound area, do not take the strips off. They will fall off on their own in a few days. If the strips remain in place after 14 days, you may remove them.  Change any bandages (dressings) at least once a day or as directed by your health care provider. If the bandage sticks, soak it off with warm, soapy water.  Apply cream or ointment only as directed by your health care provider. If using cream or ointment, wash the area with soap and water 2 times a day to remove all the cream or ointment. Rinse off the soap and pat the area dry with a clean towel.  Keep the wound area  dry and clean. If the bandage becomes wet or dirty, or if it develops a bad smell, change it as soon as possible.  Continue to protect the wound from injury.  Use sunscreen when out in the sun. New scars become sunburned easily. SEEK MEDICAL CARE IF:  You have increasing redness, swelling, or pain in the wound.  You see pus coming from the wound.  You have a fever.  You notice a bad smell coming from the wound or dressing.  Your wound breaks open (edges not staying together).   This information is not intended to replace advice given to you by your health care provider. Make sure you discuss any questions you have with your health care provider.   Document Released: 04/01/2001 Document Revised: 04/27/2013 Document Reviewed: 02/16/2013 Elsevier Interactive Patient Education [employer].

## 2015-05-16 NOTE — ED Notes (Signed)
Pt reports having vaginal discharge and irritation. Also needs sutures removed from lip.

## 2015-05-16 NOTE — ED Provider Notes (Signed)
CSN: [id number]     Arrival date & time 05/16/15  1138 History   First MD Initiated Contact with Patient 05/16/15 1339     Chief Complaint  Patient presents with  . Vaginal Discharge  . Suture / Staple Removal     (Consider location/radiation/quality/duration/timing/severity/associated sxs/prior Treatment) HPI Comments: Elizabeth Dean is a 34 y.o. female with a PMHx of asthma, bronchitis, anxiety, and depression with a PSHx of tubal ligation and cholecystectomy, who presents to the ED with complaints of  vaginal discharge which is white and thick 3 days with associated vaginal irritation but no itching or pain. She reports this started after she used a new feminine spray. She is sexually active with one female partner, unprotected. She is also presenting today for suture removal of lip sutures that were placed on 04/28/15. She reports that one of the sutures has come out on its own, but she denies any issues at the suture site. No erythema/warmth/drainage. Scar is forming well.  Denies fevers, chills, CP, SOB, abd pain, N/V/D/C, hematuria, dysuria, genital lesions, vaginal bleeding, vaginal itching, myalgias, arthralgias, numbness, tingling, weakness, or color changes near scar. LMP 04/30/15  Patient is a 34 y.o. female presenting with vaginal discharge and suture removal. The history is provided by the patient. No language interpreter was used.  Vaginal Discharge Quality:  Milky and white Severity:  Moderate Onset quality:  Gradual Duration:  3 days Timing:  Constant Progression:  Unchanged Chronicity:  New Context: spontaneously   Relieved by:  None tried Worsened by:  Nothing tried Ineffective treatments:  None tried Associated symptoms: no abdominal pain, no dysuria, no fever, no nausea, no urinary frequency, no vaginal itching and no vomiting   Risk factors comment:  New feminine spray used Suture / Staple Removal Pertinent negatives include no abdominal pain, arthralgias, chest  pain, chills, fever, myalgias, nausea, numbness, vomiting or weakness.    Past Medical History  Diagnosis Date  . Asthma   . Bronchitis   . Anxiety   . Panic attacks   . Depression    Past Surgical History  Procedure Laterality Date  . Tubal ligation    . Cholecystectomy     Family History  Problem Relation Age of Onset  . Heart disease Maternal Grandmother   . Hypertension Maternal Grandmother    Social History  Substance Use Topics  . Smoking status: Current Every Day Smoker -- 0.50 packs/day    Types: Cigarettes  . Smokeless tobacco: None  . Alcohol Use: 1.2 oz/week    2 Cans of beer per week     Comment: socially   OB History    No data available     Review of Systems  Constitutional: Negative for fever and chills.  Respiratory: Negative for shortness of breath.   Cardiovascular: Negative for chest pain.  Gastrointestinal: Negative for nausea, vomiting, abdominal pain, diarrhea and constipation.  Genitourinary: Positive for vaginal discharge. Negative for dysuria, hematuria, vaginal bleeding, genital sores and vaginal pain.  Musculoskeletal: Negative for myalgias and arthralgias.  Skin: Positive for wound (lip lac (healed) with sutures). Negative for color change.  Allergic/Immunologic: Negative for immunocompromised state.  Neurological: Negative for weakness and numbness.  Psychiatric/Behavioral: Negative for confusion.   10 Systems reviewed and are negative for acute change except as noted in the HPI.    Allergies  Hydrocodone and Codeine  Home Medications   Prior to Admission medications   Medication Sig Start Date End Date Taking? Authorizing Provider  albuterol (PROVENTIL  HFA;VENTOLIN HFA) 108 (90 BASE) MCG/ACT inhaler Inhale 2 puffs into the lungs every 4 (four) hours as needed for wheezing or shortness of breath. 04/04/14   Tresa Garter, MD  amoxicillin-clavulanate (AUGMENTIN) 875-125 MG tablet Take 1 tablet by mouth 2 (two) times daily.  04/28/15   Hope Bunnie Pion, NP  FLUoxetine (PROZAC) 40 MG capsule Take 1 capsule (40 mg total) by mouth daily. 04/04/14   Tresa Garter, MD  gemfibrozil (LOPID) 600 MG tablet Take 1 tablet (600 mg total) by mouth 2 (two) times daily before a meal. 04/04/14   Tresa Garter, MD  omeprazole (PRILOSEC) 20 MG capsule Take 2 capsules (40 mg total) by mouth daily. 04/04/14   Tresa Garter, MD  oxyCODONE-acetaminophen (ROXICET) 5-325 MG tablet Take 1 tablet by mouth every 6 (six) hours as needed for severe pain. 04/30/15   Hope Bunnie Pion, NP  traMADol (ULTRAM) 50 MG tablet Take 1 tablet (50 mg total) by mouth every 6 (six) hours as needed. 04/28/15   Hope Bunnie Pion, NP  traZODone (DESYREL) 50 MG tablet Take 1 tablet (50 mg total) by mouth at bedtime. 02/14/14   Tresa Garter, MD   Triage VS: BP 144/88 mmHg  Pulse 110  Temp(Src) 98.5 F (36.9 C) (Oral)  Resp 18  SpO2 100%  LMP 04/30/2015 Re-check VS: BP 121/77 mmHg  Pulse 90  Temp(Src) 98.5 F (36.9 C) (Oral)  Resp 20  SpO2 100%  LMP 04/30/2015  Physical Exam  Constitutional: She is oriented to person, place, and time. Vital signs are normal. She appears well-developed and well-nourished.  Non-toxic appearance. No distress.  Afebrile, nontoxic, NAD  HENT:  Head: Normocephalic and atraumatic.  Mouth/Throat: Mucous membranes are normal. Lacerations (healed) present.    Healed L lip lac with 5 sutures remaining, skin margins clear and well approximated, scar without drainage or induration/erythema/warmth.   Eyes: Conjunctivae and EOM are normal. Right eye exhibits no discharge. Left eye exhibits no discharge.  Neck: Normal range of motion. Neck supple.  Cardiovascular: Normal rate, regular rhythm, normal heart sounds and intact distal pulses.  Exam reveals no gallop and no friction rub.   No murmur heard. Tachycardic in triage which resolved during exam  Pulmonary/Chest: Effort normal and breath sounds normal. No respiratory  distress. She has no decreased breath sounds. She has no wheezes. She has no rhonchi. She has no rales.  Abdominal: Soft. Normal appearance and bowel sounds are normal. She exhibits no distension. There is no tenderness. There is no rigidity, no rebound, no guarding, no CVA tenderness, no tenderness at McBurney's point and negative Murphy's sign.  Genitourinary: Uterus normal. Pelvic exam was performed with patient supine. There is no rash, tenderness or lesion on the right labia. There is no rash, tenderness or lesion on the left labia. Cervix exhibits discharge and friability. Cervix exhibits no motion tenderness. Right adnexum displays no mass, no tenderness and no fullness. Left adnexum displays no mass, no tenderness and no fullness. No erythema or bleeding in the vagina. Vaginal discharge found.  Chaperone present for exam. No rashes, lesions, or tenderness to external genitalia. No erythema, injury, or tenderness to vaginal mucosa. White mucoid vaginal discharge without bleeding within vaginal vault. No adnexal masses, tenderness, or fullness. No CMT, but with some cervical friability and mucoid discharge from cervical os. Uterus non-deviated, mobile, nonTTP, and without enlargement.    Musculoskeletal: Normal range of motion.  Neurological: She is alert and oriented to person, place, and time.  She has normal strength. No sensory deficit.  Skin: Skin is warm, dry and intact. No rash noted.  Psychiatric: She has a normal mood and affect.  Nursing note and vitals reviewed.   ED Course  .Suture Removal Date/Time: 05/16/2015 1:55 PM Performed by: Shann Medal Janisse Ghan Authorized by: Zacarias Pontes Consent: Verbal consent obtained. Risks and benefits: risks, benefits and alternatives were discussed Consent given by: patient Patient understanding: patient states understanding of the procedure being performed Patient consent: the patient's understanding of the procedure matches  consent given Patient identity confirmed: verbally with patient Body area: mouth Wound Appearance: clean Sutures Removed: 5 Facility: sutures placed in this facility Patient tolerance: Patient tolerated the procedure well with no immediate complications   (including critical care time) Labs Review Labs Reviewed  WET PREP, GENITAL - Abnormal; Notable for the following:    WBC, Wet Prep HPF POC FEW (*)    All other components within normal limits  RPR  HIV ANTIBODY (ROUTINE TESTING)  GC/CHLAMYDIA PROBE AMP ([place]) NOT AT [facility]    Imaging Review No results found. I have personally reviewed and evaluated these images and lab results as part of my medical decision-making.   EKG Interpretation None      MDM   Final diagnoses:  Vaginal discharge  Visit for suture removal    34 y.o. female here with vaginal discharge x3 days after using a feminine spray, and requesting suture removal from lip (placed 10/8). Sutures removed without issue, only 5 of 6 remained, discussed sunscreen use x78yr. Will obtain pelvic and STD check. Will reassess shortly.   2:11 PM Pelvic with mucoid discharge, no tenderness, will proceed with empiric tx of GC/CT. Will reassess after wet prep.  2:49 PM Wet prep with few WBC, otherwise unremarkable. Empirically treated for GC/CT. Discussed use of condoms. F/up with PCP in 1wk for ongoing symptoms. Also discussed scar care. I explained the diagnosis and have given explicit precautions to return to the ER including for any other new or worsening symptoms. The patient understands and accepts the medical plan as it's been dictated and I have answered their questions. Discharge instructions concerning home care and prescriptions have been given. The patient is STABLE and is discharged to home in good condition.  BP 109/67 mmHg  Pulse 66  Temp(Src) 98.5 F (36.9 C) (Oral)  Resp 20  SpO2 100%  LMP 04/30/2015  Meds ordered this encounter  Medications  .  azithromycin (ZITHROMAX) tablet 1,000 mg    Sig:    And  . cefTRIAXone (ROCEPHIN) injection 250 mg    Sig:     Order Specific Question:  Antibiotic Indication:    Answer:  STD     Zacarias Pontes, PA-C 05/16/15 1450  Quintella Reichert, MD 05/17/15 [phone]

## 2015-05-17 LAB — HIV ANTIBODY (ROUTINE TESTING W REFLEX): HIV SCREEN 4TH GENERATION: NONREACTIVE

## 2015-05-17 LAB — GC/CHLAMYDIA PROBE AMP (~~LOC~~) NOT AT ARMC
Chlamydia: NEGATIVE
Neisseria Gonorrhea: NEGATIVE

## 2015-05-17 LAB — RPR: RPR: NONREACTIVE

## 2015-06-23 ENCOUNTER — Emergency Department (HOSPITAL_COMMUNITY)
Admission: EM | Admit: 2015-06-23 | Discharge: 2015-06-23 | Disposition: A | Discharge status: Home / Self Care | Payer: Self-pay | Attending: Emergency Medicine | Admitting: Emergency Medicine

## 2015-06-23 ENCOUNTER — Emergency Department (HOSPITAL_COMMUNITY): Payer: Self-pay

## 2015-06-23 ENCOUNTER — Encounter (HOSPITAL_COMMUNITY): Payer: Self-pay | Admitting: Emergency Medicine

## 2015-06-23 DIAGNOSIS — F419 Anxiety disorder, unspecified: Secondary | ICD-10-CM | POA: Insufficient documentation

## 2015-06-23 DIAGNOSIS — S93402A Sprain of unspecified ligament of left ankle, initial encounter: Secondary | ICD-10-CM | POA: Insufficient documentation

## 2015-06-23 DIAGNOSIS — Y9302 Activity, running: Secondary | ICD-10-CM | POA: Insufficient documentation

## 2015-06-23 DIAGNOSIS — J45909 Unspecified asthma, uncomplicated: Secondary | ICD-10-CM | POA: Insufficient documentation

## 2015-06-23 DIAGNOSIS — Z79899 Other long term (current) drug therapy: Secondary | ICD-10-CM | POA: Insufficient documentation

## 2015-06-23 DIAGNOSIS — R2242 Localized swelling, mass and lump, left lower limb: Secondary | ICD-10-CM | POA: Insufficient documentation

## 2015-06-23 DIAGNOSIS — F17210 Nicotine dependence, cigarettes, uncomplicated: Secondary | ICD-10-CM | POA: Insufficient documentation

## 2015-06-23 DIAGNOSIS — R269 Unspecified abnormalities of gait and mobility: Secondary | ICD-10-CM | POA: Insufficient documentation

## 2015-06-23 DIAGNOSIS — F329 Major depressive disorder, single episode, unspecified: Secondary | ICD-10-CM | POA: Insufficient documentation

## 2015-06-23 DIAGNOSIS — F410 Panic disorder [episodic paroxysmal anxiety] without agoraphobia: Secondary | ICD-10-CM | POA: Insufficient documentation

## 2015-06-23 DIAGNOSIS — Y990 Civilian activity done for income or pay: Secondary | ICD-10-CM | POA: Insufficient documentation

## 2015-06-23 DIAGNOSIS — W1843XA Slipping, tripping and stumbling without falling due to stepping from one level to another, initial encounter: Secondary | ICD-10-CM | POA: Insufficient documentation

## 2015-06-23 DIAGNOSIS — Y92009 Unspecified place in unspecified non-institutional (private) residence as the place of occurrence of the external cause: Secondary | ICD-10-CM | POA: Insufficient documentation

## 2015-06-23 MED ORDER — IBUPROFEN 400 MG PO TABS
800.0000 mg | ORAL_TABLET | Freq: Once | ORAL | Status: AC
  Administered 2015-06-23: 800 mg via ORAL
  Filled 2015-06-23: qty 2
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
## 2015-06-23 NOTE — ED Provider Notes (Signed)
CSN: [id number]     Arrival date & time 06/23/15  1114 History  By signing my name below, I, Soijett Blue, attest that this documentation has been prepared under the direction and in the presence of Alecia Lemming, PA-C Electronically Signed: Soijett Blue, ED Scribe. 06/23/2015. 1:13 PM.   Chief Complaint  Patient presents with  . Ankle Pain      The history is provided by the patient. No language interpreter was used.    Elizabeth Dean is a 34 y.o. female who presents to the Emergency Department complaining of left ankle pain onset this morning. 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 She has not been able to bear weight since the incident. Pt is having associated symptoms of left ankle swelling. She notes that she has not tried any medications for the relief of her symptoms. She denies color change, wound, rash, and any other symptoms.     Past Medical History  Diagnosis Date  . Asthma   . Bronchitis   . Anxiety   . Panic attacks   . Depression    Past Surgical History  Procedure Laterality Date  . Tubal ligation    . Cholecystectomy     Family History  Problem Relation Age of Onset  . Heart disease Maternal Grandmother   . Hypertension Maternal Grandmother    Social History  Substance Use Topics  . Smoking status: Current Every Day Smoker -- 0.50 packs/day    Types: Cigarettes  . Smokeless tobacco: None  . Alcohol Use: 1.2 oz/week    2 Cans of beer per week     Comment: socially   OB History    No data available     Review of Systems  Constitutional: Negative for activity change.  Musculoskeletal: Positive for joint swelling, arthralgias and gait problem. Negative for back pain and neck pain.  Skin: Negative for color change, rash and wound.  Neurological: Negative for weakness and numbness.      Allergies  Hydrocodone and  Codeine  Home Medications   Prior to Admission medications   Medication Sig Start Date End Date Taking? Authorizing Provider  albuterol (PROVENTIL HFA;VENTOLIN HFA) 108 (90 BASE) MCG/ACT inhaler Inhale 2 puffs into the lungs every 4 (four) hours as needed for wheezing or shortness of breath. 04/04/14   Tresa Garter, MD  amoxicillin-clavulanate (AUGMENTIN) 875-125 MG tablet Take 1 tablet by mouth 2 (two) times daily. Patient not taking: Reported on 05/16/2015 04/28/15   Ashley Murrain, NP  FLUoxetine (PROZAC) 40 MG capsule Take 1 capsule (40 mg total) by mouth daily. Patient not taking: Reported on 05/16/2015 04/04/14   Tresa Garter, MD  gemfibrozil (LOPID) 600 MG tablet Take 1 tablet (600 mg total) by mouth 2 (two) times daily before a meal. Patient not taking: Reported on 05/16/2015 04/04/14   Tresa Garter, MD  omeprazole (PRILOSEC) 20 MG capsule Take 2 capsules (40 mg total) by mouth daily. Patient not taking: Reported on 05/16/2015 04/04/14   Tresa Garter, MD  oxyCODONE-acetaminophen (ROXICET) 5-325 MG tablet Take 1 tablet by mouth every 6 (six) hours as needed for severe pain. Patient not taking: Reported on 05/16/2015 04/30/15   [facility] Bunnie Pion, NP  traMADol (ULTRAM) 50 MG tablet Take 1 tablet (50 mg total) by mouth every 6 (six) hours  as needed. Patient not taking: Reported on 05/16/2015 04/28/15   Ashley Murrain, NP  traZODone (DESYREL) 50 MG tablet Take 1 tablet (50 mg total) by mouth at bedtime. Patient not taking: Reported on 05/16/2015 02/14/14   Tresa Garter, MD   BP 114/85 mmHg  Pulse 80  Temp(Src) 98.6 F (37 C) (Oral)  Resp 18  SpO2 100%  LMP 05/26/2015 Physical Exam  Constitutional: She is oriented to person, place, and time. She appears well-developed and well-nourished. No distress.  HENT:  Head: Normocephalic and atraumatic.  Eyes: Conjunctivae and EOM are normal.  Neck: Normal range of motion. Neck supple.  Cardiovascular: Normal rate.    Pulses:      Dorsalis pedis pulses are 2+ on the right side, and 2+ on the left side.       Posterior tibial pulses are 2+ on the right side, and 2+ on the left side.  Pulmonary/Chest: Effort normal. No respiratory distress.  Musculoskeletal: Normal range of motion. She exhibits edema and tenderness.  Patient complains of pain with palpation of the lateral left ankle. She denies pain with palpation over the fibular head of the affected side. She denies pain in the hip of the affected side.  Neurological: She is alert and oriented to person, place, and time.  Distal motor, sensation, and vascular intact.   Skin: Skin is warm and dry. She is not diaphoretic.  Psychiatric: She has a normal mood and affect. Her behavior is normal.  Nursing note and vitals reviewed.   ED Course  Procedures (including critical care time) DIAGNOSTIC STUDIES: Oxygen Saturation is 100% on RA, nl by my interpretation.    COORDINATION OF CARE: 1:12 PM Discussed treatment plan with pt at bedside which includes left ankle xray, ASO brace, crutches, ibuprofen, and RICE and pt agreed to plan. Orthopedic follow-up if not improved in one week.    Labs Review Labs Reviewed - No data to display  Imaging Review Dg Ankle Complete Left  06/23/2015  CLINICAL DATA:  Left ankle pain and swelling after falling down steps today. Eversion injury. EXAM: LEFT ANKLE COMPLETE - 3+ VIEW COMPARISON:  None. FINDINGS: The mineralization and alignment are normal. There is no evidence of acute fracture or dislocation. The joint spaces are maintained. There is possible mild soft tissue swelling anterolaterally. No large joint effusion seen. IMPRESSION: No acute osseous findings. Electronically Signed   By: Richardean Sale M.D.   On: 06/23/2015 12:56   I have personally reviewed and evaluated these images as part of my medical decision-making.   EKG Interpretation None       Patient was counseled on RICE protocol and told to rest  injury, use ice for no longer than 15 minutes every hour, compress the area, and elevate above the level of their heart as much as possible to reduce swelling. Questions answered. Patient verbalized understanding.     MDM   Final diagnoses:  Ankle sprain, left, initial encounter   Patient with ankle injury, negative imaging. Lower extremity is neurovascularly intact. Will treat as sprain with conservative measures. Follow-up as above if not improved in a week.  I personally performed the services described in this documentation, which was scribed in my presence. The recorded information has been reviewed and is accurate.     Carlisle Cater, PA-C 06/23/15 [place], MD 06/24/15 [phone]

## 2015-06-23 NOTE — Discharge Instructions (Signed)
Please read and follow all provided instructions.  Your diagnoses today include:  1. Ankle sprain, left, initial encounter     Tests performed today include:  An x-ray of your ankle - does NOT show any broken bones  Vital signs. See below for your results today.   Medications prescribed:   Ibuprofen (Motrin, Advil) - anti-inflammatory pain medication  Do not exceed 600mg  ibuprofen every 6 hours, take with food  You have been prescribed an anti-inflammatory medication or NSAID. Take with food. Take smallest effective dose for the shortest duration needed for your pain. Stop taking if you experience stomach pain or vomiting.   Take any prescribed medications only as directed.  Home care instructions:   Follow any educational materials contained in this packet  Follow R.I.C.E. Protocol:  R - rest your injury   I  - use ice on injury without applying directly to skin  C - compress injury with bandage or splint  E - elevate the injury as much as possible  Follow-up instructions: Please follow-up with your primary care provider or the provided orthopedic (bone specialist) if you continue to have significant pain or trouble walking in 1 week. In this case you may have a severe sprain that requires further care.   Return instructions:   Please return if your toes are numb or tingling, appear gray or blue, or you have severe pain (also elevate leg and loosen splint or wrap)  Please return to the Emergency Department if you experience worsening symptoms.   Please return if you have any other emergent concerns.  Additional Information:  Your vital signs today were: BP 114/85 mmHg   Pulse 80   Temp(Src) 98.6 F (37 C) (Oral)   Resp 18   SpO2 100%   LMP 05/26/2015 If your blood pressure (BP) was elevated above 135/85 this visit, please have this repeated by your doctor within one month. -------------- Your caregiver has diagnosed you as suffering from an ankle sprain. Ankle  sprain occurs when the ligaments that hold the ankle joint together are stretched or torn. It may take 4 to 6 weeks to heal.  For Activity: If prescribed crutches, use crutches with non-weight bearing for the first few days. Then, you may walk on your ankle as the pain allows, or as instructed. Start gradually with weight bearing on the affected ankle. Once you can walk pain free, then try jogging. When you can run forwards, then you can try moving side-to-side. If you cannot walk without crutches in one week, you need a re-check. --------------

## 2015-06-23 NOTE — ED Notes (Signed)
Pt in radiology 

## 2015-09-03 ENCOUNTER — Encounter: Payer: Self-pay | Admitting: Internal Medicine

## 2015-09-03 ENCOUNTER — Ambulatory Visit: Payer: Self-pay | Attending: Internal Medicine | Admitting: Internal Medicine

## 2015-09-03 ENCOUNTER — Ambulatory Visit (HOSPITAL_BASED_OUTPATIENT_CLINIC_OR_DEPARTMENT_OTHER): Payer: Self-pay | Admitting: Clinical

## 2015-09-03 VITALS — BP 118/87 | HR 92 | Temp 98.3°F | Resp 18 | Ht 60.0 in | Wt 150.0 lb

## 2015-09-03 DIAGNOSIS — Z0000 Encounter for general adult medical examination without abnormal findings: Secondary | ICD-10-CM

## 2015-09-03 DIAGNOSIS — Z23 Encounter for immunization: Secondary | ICD-10-CM | POA: Insufficient documentation

## 2015-09-03 DIAGNOSIS — F4323 Adjustment disorder with mixed anxiety and depressed mood: Secondary | ICD-10-CM

## 2015-09-03 DIAGNOSIS — J45909 Unspecified asthma, uncomplicated: Secondary | ICD-10-CM | POA: Insufficient documentation

## 2015-09-03 DIAGNOSIS — F329 Major depressive disorder, single episode, unspecified: Secondary | ICD-10-CM | POA: Insufficient documentation

## 2015-09-03 DIAGNOSIS — F17210 Nicotine dependence, cigarettes, uncomplicated: Secondary | ICD-10-CM | POA: Insufficient documentation

## 2015-09-03 DIAGNOSIS — O3680X0 Pregnancy with inconclusive fetal viability, not applicable or unspecified: Secondary | ICD-10-CM | POA: Insufficient documentation

## 2015-09-03 DIAGNOSIS — Z3200 Encounter for pregnancy test, result unknown: Secondary | ICD-10-CM

## 2015-09-03 DIAGNOSIS — F411 Generalized anxiety disorder: Secondary | ICD-10-CM | POA: Insufficient documentation

## 2015-09-03 DIAGNOSIS — Z888 Allergy status to other drugs, medicaments and biological substances status: Secondary | ICD-10-CM | POA: Insufficient documentation

## 2015-09-03 LAB — POCT URINE PREGNANCY: Preg Test, Ur: NEGATIVE

## 2015-09-03 MED ORDER — PAROXETINE HCL 20 MG PO TABS: 20.0000 mg | ORAL_TABLET | Freq: Every day | ORAL | Status: DC

## 2015-09-03 MED ORDER — HYDROXYZINE HCL 25 MG PO TABS: 25.0000 mg | ORAL_TABLET | Freq: Three times a day (TID) | ORAL | Status: DC | PRN

## 2015-09-03 MED FILL — ?HYDROXYZINE HCL 25 MG TAB: 25 | 20 days supply | Qty: 60 | Fill #0

## 2015-09-03 MED FILL — PARoxetine HCL 20 MG TABS: 20 | 30 days supply | Qty: 30 | Fill #0

## 2015-09-03 NOTE — Patient Instructions (Signed)
Major Depressive Disorder Major depressive disorder is a mental illness. It also may be called clinical depression or unipolar depression. Major depressive disorder usually causes feelings of sadness, hopelessness, or helplessness. Some people with this disorder do not feel particularly sad but lose interest in doing things they used to enjoy (anhedonia). Major depressive disorder also can cause physical symptoms. It can interfere with work, school, relationships, and other normal everyday activities. The disorder varies in severity but is longer lasting and more serious than the sadness we all feel from time to time in our lives. Major depressive disorder often is triggered by stressful life events or major life changes. Examples of these triggers include divorce, loss of your job or home, a move, and the death of a family member or close friend. Sometimes this disorder occurs for no obvious reason at all. People who have family members with major depressive disorder or bipolar disorder are at higher risk for developing this disorder, with or without life stressors. Major depressive disorder can occur at any age. It may occur just once in your life (single episode major depressive disorder). It may occur multiple times (recurrent major depressive disorder). SYMPTOMS People with major depressive disorder have either anhedonia or depressed mood on nearly a daily basis for at least 2 weeks or longer. Symptoms of depressed mood include:  Feelings of sadness (blue or down in the dumps) or emptiness.  Feelings of hopelessness or helplessness.  Tearfulness or episodes of crying (may be observed by others).  Irritability (children and adolescents). In addition to depressed mood or anhedonia or both, people with this disorder have at least four of the following symptoms:  Difficulty sleeping or sleeping too much.   Significant change (increase or decrease) in appetite or weight.   Lack of energy or  motivation.  Feelings of guilt and worthlessness.   Difficulty concentrating, remembering, or making decisions.  Unusually slow movement (psychomotor retardation) or restlessness (as observed by others).   Recurrent wishes for death, recurrent thoughts of self-harm (suicide), or a suicide attempt. People with major depressive disorder commonly have persistent negative thoughts about themselves, other people, and the world. People with severe major depressive disorder may experiencedistorted beliefs or perceptions about the world (psychotic delusions). They also may see or hear things that are not real (psychotic hallucinations). DIAGNOSIS Major depressive disorder is diagnosed through an assessment by your health care provider. Your health care provider will ask aboutaspects of your daily life, such as mood,sleep, and appetite, to see if you have the diagnostic symptoms of major depressive disorder. Your health care provider may ask about your medical history and use of alcohol or drugs, including prescription medicines. Your health care provider also may do a physical exam and blood work. This is because certain medical conditions and the use of certain substances can cause major depressive disorder-like symptoms (secondary depression). Your health care provider also may refer you to a mental health specialist for further evaluation and treatment. TREATMENT It is important to recognize the symptoms of major depressive disorder and seek treatment. The following treatments can be prescribed for this disorder:   Medicine. Antidepressant medicines usually are prescribed. Antidepressant medicines are thought to correct chemical imbalances in the brain that are commonly associated with major depressive disorder. Other types of medicine may be added if the symptoms do not respond to antidepressant medicines alone or if psychotic delusions or hallucinations occur.  Talk therapy. Talk therapy can be  helpful in treating major depressive disorder by providing   support, education, and guidance. Certain types of talk therapy also can help with negative thinking (cognitive behavioral therapy) and with relationship issues that trigger this disorder (interpersonal therapy). A mental health specialist can help determine which treatment is best for you. Most people with major depressive disorder do well with a combination of medicine and talk therapy. Treatments involving electrical stimulation of the brain can be used in situations with extremely severe symptoms or when medicine and talk therapy do not work over time. These treatments include electroconvulsive therapy, transcranial magnetic stimulation, and vagal nerve stimulation.   This information is not intended to replace advice given to you by your health care provider. Make sure you discuss any questions you have with your health care provider.   Document Released: 11/01/2012 Document Revised: 07/28/2014 Document Reviewed: 11/01/2012 Elsevier Interactive Patient Education 2016 Elsevier Inc. Generalized Anxiety Disorder Generalized anxiety disorder (GAD) is a mental disorder. It interferes with life functions, including relationships, work, and school. GAD is different from normal anxiety, which everyone experiences at some point in their lives in response to specific life events and activities. Normal anxiety actually helps [place] prepare for and get through these life events and activities. Normal anxiety goes away after the event or activity is over.  GAD causes anxiety that is not necessarily related to specific events or activities. It also causes excess anxiety in proportion to specific events or activities. The anxiety associated with GAD is also difficult to control. GAD can vary from mild to severe. People with severe GAD can have intense waves of anxiety with physical symptoms (panic attacks).  SYMPTOMS The anxiety and worry associated with GAD  are difficult to control. This anxiety and worry are related to many life events and activities and also occur more days than not for 6 months or longer. People with GAD also have three or more of the following symptoms (one or more in children):  Restlessness.   Fatigue.  Difficulty concentrating.   Irritability.  Muscle tension.  Difficulty sleeping or unsatisfying sleep. DIAGNOSIS GAD is diagnosed through an assessment by your health care provider. Your health care provider will ask you questions aboutyour mood,physical symptoms, and events in your life. Your health care provider may ask you about your medical history and use of alcohol or drugs, including prescription medicines. Your health care provider may also do a physical exam and blood tests. Certain medical conditions and the use of certain substances can cause symptoms similar to those associated with GAD. Your health care provider may refer you to a mental health specialist for further evaluation. TREATMENT The following therapies are usually used to treat GAD:   Medication. Antidepressant medication usually is prescribed for long-term daily control. Antianxiety medicines may be added in severe cases, especially when panic attacks occur.   Talk therapy (psychotherapy). Certain types of talk therapy can be helpful in treating GAD by providing support, education, and guidance. A form of talk therapy called cognitive behavioral therapy can teach you healthy ways to think about and react to daily life events and activities.  Stress managementtechniques. These include yoga, meditation, and exercise and can be very helpful when they are practiced regularly. A mental health specialist can help determine which treatment is best for you. Some people see improvement with one therapy. However, other people require a combination of therapies.   This information is not intended to replace advice given to you by your health care  provider. Make sure you discuss any questions you have with your  with your health care provider. °  °Document Released: 11/01/2012 Document Revised: 07/28/2014 Document Reviewed: 11/01/2012 °Elsevier Interactive Patient Education ©2016 Elsevier Inc. ° °

## 2015-09-03 NOTE — Progress Notes (Signed)
ASSESSMENT: Pt currently experiencing Adjustment disorder with mixed anxiety and depressed mood. Pt needs to f/u with PCP and [facility]; would benefit from psychoeducation and brief therapeutic interventions to cope with symptoms of anxiety and depression.  Stage of Change: contemplative  PLAN: 1. F/U with behavioral health consultant in one month 2. Psychiatric Medications: Atarax. 3. Behavioral recommendation(s):   -Consider daily relaxation breathing, as practiced in office -Consider reading educational material regarding coping with symptoms of anxiety and depression -Remember self-care as important to wellbeing  SUBJECTIVE: Pt. referred by Dr Doreene Burke for symptoms of anxiety and depression:  Pt. 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 Duration of problem: Over one month Severity: moderate  OBJECTIVE: Orientation & Cognition: Oriented x3. Thought processes normal and appropriate to situation. Mood: appropriate. Affect: appropriate Appearance: appropriate Risk of harm to self or others: no known risk of harm to self or others Substance use: tobacco, alcohol Assessments administered: PHQ9: 9/ GAD7: 15  Diagnosis: Adjustment disorder with anxiety and depressed mood CPT Code: F43.23 -------------------------------------------- Other(s) present in the room: none  Time spent with patient in exam room: 20 minutes, 11-11:30am   Depression screen [facility] 2/9 09/03/2015 01/02/2014  Decreased Interest 1 1  Down, Depressed, Hopeless 3 1  PHQ - 2 Score 4 2  Altered sleeping 2 1  Tired, decreased energy 1 1  Change in appetite 1 2  Feeling bad or failure about yourself  1 0  Trouble concentrating 0 1  Moving slowly or fidgety/restless 0 0  Suicidal thoughts 0 0  PHQ-9 Score 9 7

## 2015-09-03 NOTE — Progress Notes (Signed)
Patient is here for PT and Anxiety  Patient complains of anxiety being present with depression  Patient denies pain at this time.  Patient would like the flu shot today. Patient tolerated the flu shot well.

## 2015-09-03 NOTE — Progress Notes (Signed)
Elizabeth Dean, is a 35 y.o. female  [id number]  [id number]  DOB - February 04, 1981  CC:  Chief Complaint  Patient presents with  . Results    Pregnancy Test   HPI: Elizabeth Dean is a 35 y.o. female here today for pregnancy test. Patient has history of asthma, anxiety and depression, panic attack and major depressive disorder. Patient pregnancy test today is negative. LMP 08/22/2015, G4P4. Patient with additional complaint of anxiety describes as "intense and causing panic attacks at work".  Patient admits depression worse over last 4 to 5 months and is related to pressures at home, and at work. Patient tearful today but denies suicidal ideation or thoughts of harming her self or others.  PHQ score of 9.  Patient was prescribed prozac but states it didn't help after taking for 4 to 5 months.  Patient admits smoking small cigars instead of cigarettes and is trying to cut down. Patient was referred and seen by LCSW. Patient has No headache, No chest pain, No abdominal pain - No Nausea, No new weakness tingling or numbness, No Cough - SOB.  Allergies  Allergen Reactions  . Hydrocodone Other (See Comments)    Flares gallbladder  . Codeine Hives   Past Medical History  Diagnosis Date  . Asthma   . Bronchitis   . Anxiety   . Panic attacks   . Depression    Current Outpatient Prescriptions on File Prior to Visit  Medication Sig Dispense Refill  . albuterol (PROVENTIL HFA;VENTOLIN HFA) 108 (90 BASE) MCG/ACT inhaler Inhale 2 puffs into the lungs every 4 (four) hours as needed for wheezing or shortness of breath. (Patient not taking: Reported on 09/03/2015) 1 Inhaler 1   No current facility-administered medications on file prior to visit.   Family History  Problem Relation Age of Onset  . Heart disease Maternal Grandmother   . Hypertension Maternal Grandmother    Social History   Social History  . Marital Status: Single    Spouse Name: N/A  . Number of Children: N/A  . Years of  Education: N/A   Occupational History  . Not on file.   Social History Main Topics  . Smoking status: Current Every Day Smoker -- 0.50 packs/day    Types: Cigarettes  . Smokeless tobacco: Not on file  . Alcohol Use: 1.2 oz/week    2 Cans of beer per week     Comment: socially  . Drug Use: No  . Sexual Activity: Yes    Birth Control/ Protection: None   Other Topics Concern  . Not on file   Social History Narrative    Review of Systems: Constitutional: Negative for fever, chills, diaphoresis, activity change, appetite change and fatigue. HENT: Negative for ear pain, nosebleeds, congestion, facial swelling, rhinorrhea, neck pain, neck stiffness and ear discharge.  Eyes: Negative for pain, discharge, redness, itching and visual disturbance. Respiratory: Negative for cough, choking, chest tightness, shortness of breath, wheezing and stridor.  Cardiovascular: Negative for chest pain, palpitations and leg swelling. Gastrointestinal: Negative for abdominal distention. Genitourinary: Negative for dysuria, urgency, frequency, hematuria, flank pain, decreased urine volume, difficulty urinating and dyspareunia.  Musculoskeletal: Negative for back pain, joint swelling, arthralgia and gait problem. Neurological: Negative for dizziness, tremors, seizures, syncope, facial asymmetry, speech difficulty, weakness, light-headedness, numbness and headaches.  Hematological: Negative for adenopathy. Does not bruise/bleed easily. Psychiatric/Behavioral: Negative for hallucinations, behavioral problems, confusion, dysphoric mood, decreased concentration and agitation.    Objective:   Filed Vitals:   09/03/15 0940  BP: 118/87  Pulse: 92  Temp: 98.3 F (36.8 C)  Resp: 18    Physical Exam: Constitutional: Patient appears well-developed and well-nourished. No distress. Neck: Normal ROM. Neck supple. No JVD. No tracheal deviation. No thyromegaly. CVS: RRR, S1/S2 +, no murmurs, no gallops, no  carotid bruit.  Pulmonary: Effort and breath sounds normal, no stridor, rhonchi, wheezes, rales.  Abdominal: Soft. BS +, no distension, tenderness, rebound or guarding.  Lymphadenopathy: No lymphadenopathy noted, cervical, inguinal or axillary Neuro: Alert. Normal reflexes, muscle tone coordination.  Skin: Skin is warm and dry. No rash noted. Not diaphoretic. No erythema. No pallor. Psychiatric: Patient is tearful, expresses anxiety and depression, . Behavior, judgment, thought content normal.  Lab Results  Component Value Date   WBC 12.3* 01/02/2014   HGB 11.7* 01/02/2014   HCT 35.8* 01/02/2014   MCV 80.3 01/02/2014   PLT 305 01/02/2014   Lab Results  Component Value Date   CREATININE 0.63 01/02/2014   BUN 12 01/02/2014   NA 139 01/02/2014   K 3.9 01/02/2014   CL 105 01/02/2014   CO2 27 01/02/2014    No results found for: HGBA1C Lipid Panel     Component Value Date/Time   CHOL 105 04/04/2014 1109   TRIG 128 04/04/2014 1109   HDL 32* 04/04/2014 1109   CHOLHDL 3.3 04/04/2014 1109   VLDL 26 04/04/2014 1109   LDLCALC 47 04/04/2014 1109       Assessment and plan:   Elizabeth Dean was seen today for results.  Diagnoses and all orders for this visit:  Possible pregnancy -     POCT urine pregnancy  Healthcare maintenance -     Flu Vaccine QUAD 36+ mos PF IM (Fluarix & Fluzone Quad PF)  Adjustment disorder with mixed anxiety and depressed mood -     PARoxetine (PAXIL) 20 MG tablet; Take 1 tablet (20 mg total) by mouth daily. -     hydrOXYzine (ATARAX/VISTARIL) 25 MG tablet; Take 1 tablet (25 mg total) by mouth 3 (three) times daily as needed for anxiety.  Referred to LCSW seen today. Patient encouraged to develop support system which can help her with her stressors.   Elizabeth Dean was counseled on the dangers of tobacco use, and was advised to quit. Reviewed strategies to maximize success, including removing cigarettes and smoking materials from environment, stress management and  support of family/friends.   Return in about 4 weeks (around 10/01/2015) for Generalized Anxiety Disorder.  The patient was given clear instructions to go to ER or return to medical center if symptoms don't improve, worsen or new problems develop. The patient verbalized understanding. The patient was told to call to get lab results if they haven't heard anything in the next week.     Clois Dupes, [place] [facility] [phone] 09/03/2015, 12:41 PM   Evaluation and management procedures were performed by the Advanced Practitioner under my supervision and collaboration. I have reviewed the Advanced Practitioner's note and chart, and I agree with the management and plan.   Angelica Chessman, MD, [place], [place], [place], [place] and [place], [place]   09/03/2015, 5:32 PM

## 2015-09-25 MED FILL — ?HYDROXYZINE HCL 25 MG TAB: 25 | 20 days supply | Qty: 60 | Fill #1

## 2015-09-25 MED FILL — PARoxetine HCL 20 MG TABS: 20 | 30 days supply | Qty: 30 | Fill #1

## 2015-10-16 MED FILL — PARoxetine HCL 20 MG TABS: 20 | 30 days supply | Qty: 30 | Fill #2

## 2015-10-16 MED FILL — ?HYDROXYZINE HCL 25 MG TAB: 25 | 20 days supply | Qty: 60 | Fill #2

## 2015-11-01 ENCOUNTER — Ambulatory Visit: Payer: Self-pay | Attending: Internal Medicine | Admitting: Internal Medicine

## 2015-11-01 ENCOUNTER — Encounter: Payer: Self-pay | Admitting: Internal Medicine

## 2015-11-01 VITALS — BP 111/74 | HR 78 | Temp 97.8°F | Resp 18 | Ht 63.0 in | Wt 148.0 lb

## 2015-11-01 DIAGNOSIS — F329 Major depressive disorder, single episode, unspecified: Secondary | ICD-10-CM | POA: Insufficient documentation

## 2015-11-01 DIAGNOSIS — Z79899 Other long term (current) drug therapy: Secondary | ICD-10-CM | POA: Insufficient documentation

## 2015-11-01 DIAGNOSIS — F4323 Adjustment disorder with mixed anxiety and depressed mood: Secondary | ICD-10-CM

## 2015-11-01 DIAGNOSIS — F410 Panic disorder [episodic paroxysmal anxiety] without agoraphobia: Secondary | ICD-10-CM | POA: Insufficient documentation

## 2015-11-01 DIAGNOSIS — Z760 Encounter for issue of repeat prescription: Secondary | ICD-10-CM | POA: Insufficient documentation

## 2015-11-01 DIAGNOSIS — J45909 Unspecified asthma, uncomplicated: Secondary | ICD-10-CM | POA: Insufficient documentation

## 2015-11-01 DIAGNOSIS — F411 Generalized anxiety disorder: Secondary | ICD-10-CM | POA: Insufficient documentation

## 2015-11-01 DIAGNOSIS — Z888 Allergy status to other drugs, medicaments and biological substances status: Secondary | ICD-10-CM | POA: Insufficient documentation

## 2015-11-01 DIAGNOSIS — K029 Dental caries, unspecified: Secondary | ICD-10-CM

## 2015-11-01 MED ORDER — PAROXETINE HCL 20 MG PO TABS: 20.0000 mg | ORAL_TABLET | Freq: Every day | ORAL | Status: DC

## 2015-11-01 MED ORDER — HYDROXYZINE HCL 25 MG PO TABS: 25.0000 mg | ORAL_TABLET | Freq: Three times a day (TID) | ORAL | Status: DC | PRN

## 2015-11-01 MED FILL — ?HYDROXYZINE HCL 25 MG TAB: 25 | 20 days supply | Qty: 60 | Fill #0

## 2015-11-01 MED FILL — ?PAROXETINE 20 MG TABLET: 20 MG | 30 days supply | Qty: 30 | Fill #0

## 2015-11-01 NOTE — Patient Instructions (Signed)
Generalized Anxiety Disorder Generalized anxiety disorder (GAD) is a mental disorder. It interferes with life functions, including relationships, work, and school. GAD is different from normal anxiety, which everyone experiences at some point in their lives in response to specific life events and activities. Normal anxiety actually helps us prepare for and get through these life events and activities. Normal anxiety goes away after the event or activity is over.  GAD causes anxiety that is not necessarily related to specific events or activities. It also causes excess anxiety in proportion to specific events or activities. The anxiety associated with GAD is also difficult to control. GAD can vary from mild to severe. People with severe GAD can have intense waves of anxiety with physical symptoms (panic attacks).  SYMPTOMS The anxiety and worry associated with GAD are difficult to control. This anxiety and worry are related to many life events and activities and also occur more days than not for 6 months or longer. People with GAD also have three or more of the following symptoms (one or more in children):  Restlessness.   Fatigue.  Difficulty concentrating.   Irritability.  Muscle tension.  Difficulty sleeping or unsatisfying sleep. DIAGNOSIS GAD is diagnosed through an assessment by your health care provider. Your health care provider will ask you questions aboutyour mood,physical symptoms, and events in your life. Your health care provider may ask you about your medical history and use of alcohol or drugs, including prescription medicines. Your health care provider may also do a physical exam and blood tests. Certain medical conditions and the use of certain substances can cause symptoms similar to those associated with GAD. Your health care provider may refer you to a mental health specialist for further evaluation. TREATMENT The following therapies are usually used to treat GAD:    Medication. Antidepressant medication usually is prescribed for long-term daily control. Antianxiety medicines may be added in severe cases, especially when panic attacks occur.   Talk therapy (psychotherapy). Certain types of talk therapy can be helpful in treating GAD by providing support, education, and guidance. A form of talk therapy called cognitive behavioral therapy can teach you healthy ways to think about and react to daily life events and activities.  Stress managementtechniques. These include yoga, meditation, and exercise and can be very helpful when they are practiced regularly. A mental health specialist can help determine which treatment is best for you. Some people see improvement with one therapy. However, other people require a combination of therapies.   This information is not intended to replace advice given to you by your health care provider. Make sure you discuss any questions you have with your health care provider.   Document Released: 11/01/2012 Document Revised: 07/28/2014 Document Reviewed: 11/01/2012 Elsevier Interactive Patient Education 2016 Elsevier Inc.  

## 2015-11-01 NOTE — Progress Notes (Signed)
Patient is here for 4 week FU  Patient complains of dental pain and is requesting a referral. Patient has no insurance so [facility] would be the facility. Pain is scaled at a 5 and has been present for a week.  Patient states Hydroxyzine is not providing any relief.

## 2015-11-01 NOTE — Progress Notes (Signed)
Patient ID: Elizabeth Dean, female   DOB: Aug 12, 1980, 35 y.o.   MRN: [id number]   Elizabeth Dean, is a 35 y.o. female  [id number]  [id number]  DOB - 1980-07-25  Chief Complaint  Patient presents with  . Follow-up    4 week        Subjective:   Elizabeth Dean is a 35 y.o. female with history of asthma, generalized anxiety, depression, and panic attack here today for a follow up visit and medication refill. Patient claims that her Paxil is not working, she only took it for a few days and stopped because there was no significant symptom improvement. She admits that her symptoms are improving naturally, but still has domestic stress associated with major anxiety. She needs refill on hydroxyzine. She continues to smoke cigars but trying to cut down. Patient has No headache, No chest pain, No abdominal pain - No Nausea, No new weakness tingling or numbness, No Cough - SOB. She is also complaining of dental caries and patient had pain in her left upper molar, she would like to see a dentist for possible extraction.  No problems updated.  ALLERGIES: Allergies  Allergen Reactions  . Hydrocodone Other (See Comments)    Flares gallbladder  . Codeine Hives    PAST MEDICAL HISTORY: Past Medical History  Diagnosis Date  . Asthma   . Bronchitis   . Anxiety   . Panic attacks   . Depression     MEDICATIONS AT HOME: Prior to Admission medications   Medication Sig Start Date End Date Taking? Authorizing Provider  albuterol (PROVENTIL HFA;VENTOLIN HFA) 108 (90 BASE) MCG/ACT inhaler Inhale 2 puffs into the lungs every 4 (four) hours as needed for wheezing or shortness of breath. Patient not taking: Reported on 09/03/2015 04/04/14   Tresa Garter, MD  hydrOXYzine (ATARAX/VISTARIL) 25 MG tablet Take 1 tablet (25 mg total) by mouth 3 (three) times daily as needed for anxiety. 11/01/15   Tresa Garter, MD  PARoxetine (PAXIL) 20 MG tablet Take 1 tablet (20 mg total) by mouth daily.  11/01/15   Tresa Garter, MD     Objective:   Filed Vitals:   11/01/15 1055  BP: 111/74  Pulse: 78  Temp: 97.8 F (36.6 C)  TempSrc: Oral  Resp: 18  Height: 5\' 3"  (1.6 m)  Weight: 148 lb (67.132 kg)  SpO2: 100%    Exam General appearance : Awake, alert, not in any distress. Speech Clear. Not toxic looking HEENT: Atraumatic and Normocephalic, pupils equally reactive to light and accomodation Neck: supple, no JVD. No cervical lymphadenopathy.  Chest:Good air entry bilaterally, no added sounds  CVS: S1 S2 regular, no murmurs.  Abdomen: Bowel sounds present, Non tender and not distended with no gaurding, rigidity or rebound. Extremities: B/L Lower Ext shows no edema, both legs are warm to touch Neurology: Awake alert, and oriented X 3, CN II-XII intact, Non focal Skin:No Rash  Data Review No results found for: HGBA1C   Assessment & Plan   1. Adjustment disorder with mixed anxiety and depressed mood  - hydrOXYzine (ATARAX/VISTARIL) 25 MG tablet; Take 1 tablet (25 mg total) by mouth 3 (three) times daily as needed for anxiety.  Dispense: 60 tablet; Refill: 3 - PARoxetine (PAXIL) 20 MG tablet; Take 1 tablet (20 mg total) by mouth daily.  Dispense: 30 tablet; Refill: 3  2. Dental caries  - Ambulatory referral to Dentistry  Patient have been counseled extensively about nutrition and exercise  Return in  about 6 months (around 05/02/2016) for Annual Physical, Generalized Anxiety Disorder.  The patient was given clear instructions to go to ER or return to medical center if symptoms don't improve, worsen or new problems develop. The patient verbalized understanding. The patient was told to call to get lab results if they haven't heard anything in the next week.   This note has been created with Surveyor, quantity. Any transcriptional errors are unintentional.    Angelica Chessman, MD, [place], Karilyn Cota, [place], [place]   11/01/2015, 11:32 AM

## 2015-11-09 ENCOUNTER — Encounter: Payer: Self-pay | Admitting: Clinical

## 2015-11-09 NOTE — Progress Notes (Signed)
Depression screen [facility] 2/9 11/01/2015 09/03/2015 01/02/2014  Decreased Interest 2 1 1   Down, Depressed, Hopeless 2 3 1   PHQ - 2 Score 4 4 2   Altered sleeping 1 2 1   Tired, decreased energy 3 1 1   Change in appetite 2 1 2   Feeling bad or failure about yourself  1 1 0  Trouble concentrating 2 0 1  Moving slowly or fidgety/restless 1 0 0  Suicidal thoughts 0 0 0  PHQ-9 Score 14 9 7     GAD 7 : Generalized Anxiety Score 11/01/2015  Nervous, Anxious, on Edge 3  Control/stop worrying 3  Worry too much - different things 3  Trouble relaxing 3  Restless 1  Easily annoyed or irritable 2  Afraid - awful might happen 2  Total GAD 7 Score 17

## 2015-12-17 ENCOUNTER — Emergency Department (HOSPITAL_COMMUNITY)
Admission: EM | Admit: 2015-12-17 | Discharge: 2015-12-17 | Disposition: A | Discharge status: Home / Self Care | Payer: Self-pay | Attending: Emergency Medicine | Admitting: Emergency Medicine

## 2015-12-17 ENCOUNTER — Encounter (HOSPITAL_COMMUNITY): Payer: Self-pay | Admitting: Emergency Medicine

## 2015-12-17 DIAGNOSIS — F17210 Nicotine dependence, cigarettes, uncomplicated: Secondary | ICD-10-CM | POA: Insufficient documentation

## 2015-12-17 DIAGNOSIS — K0889 Other specified disorders of teeth and supporting structures: Secondary | ICD-10-CM | POA: Insufficient documentation

## 2015-12-17 DIAGNOSIS — Z79899 Other long term (current) drug therapy: Secondary | ICD-10-CM | POA: Insufficient documentation

## 2015-12-17 DIAGNOSIS — F410 Panic disorder [episodic paroxysmal anxiety] without agoraphobia: Secondary | ICD-10-CM | POA: Insufficient documentation

## 2015-12-17 DIAGNOSIS — J45909 Unspecified asthma, uncomplicated: Secondary | ICD-10-CM | POA: Insufficient documentation

## 2015-12-17 MED ORDER — PENICILLIN V POTASSIUM 500 MG PO TABS: 500.0000 mg | ORAL_TABLET | Freq: Four times a day (QID) | ORAL | Status: AC

## 2015-12-17 MED ORDER — PENICILLIN V POTASSIUM 250 MG PO TABS
500.0000 mg | ORAL_TABLET | Freq: Once | ORAL | Status: AC
  Administered 2015-12-17: 500 mg via ORAL
  Filled 2015-12-17: qty 2

## 2015-12-17 MED ORDER — OXYCODONE-ACETAMINOPHEN 5-325 MG PO TABS
1.0000 | ORAL_TABLET | Freq: Once | ORAL | Status: AC
  Administered 2015-12-17: 1 via ORAL
  Filled 2015-12-17: qty 1

## 2015-12-17 NOTE — ED Provider Notes (Signed)
CSN: [id number]     Arrival date & time 12/17/15  2038 History  By signing my name below, I, Elizabeth Dean, attest that this documentation has been prepared under the direction and in the presence of non-physician practitioner, Pearlie Oyster, PA-C. Electronically Signed: [name], Scribe. 12/17/2015. 9:10 PM.     Chief Complaint  Patient presents with  . Dental Pain    The history is provided by the patient. No language interpreter was used.     HPI Comments: Elizabeth Dean is a 35 y.o. female who presents to the Emergency Department complaining of sudden onset, constant, left lower molar pain beginning this morning. Pt reports that this tooth has hurt in the past when she chipped it. She notes that she ate hard candy today and believes she may have further chipped the tooth. She endorses pain exacerbation with palpation to the tooth. Pt has not tried any medications for her pain. She denies SOB, trouble swallowing, or any other associated symptoms. Pt does not have a regular dentist but indicates that she will make an appointment with one tomorrow.  Past Medical History  Diagnosis Date  . Asthma   . Bronchitis   . Anxiety   . Panic attacks   . Depression    Past Surgical History  Procedure Laterality Date  . Tubal ligation    . Cholecystectomy     Family History  Problem Relation Age of Onset  . Heart disease Maternal Grandmother   . Hypertension Maternal Grandmother    Social History  Substance Use Topics  . Smoking status: Current Every Day Smoker -- 0.00 packs/day    Types: Cigarettes  . Smokeless tobacco: None  . Alcohol Use: Yes     Comment: socially   OB History    No data available     Review of Systems  HENT: Positive for dental problem (left lower molar). Negative for trouble swallowing.   Respiratory: Negative for shortness of breath.    Allergies  Hydrocodone and Codeine  Home Medications   Prior to Admission medications   Medication Sig Start  Date End Date Taking? Authorizing Provider  albuterol (PROVENTIL HFA;VENTOLIN HFA) 108 (90 BASE) MCG/ACT inhaler Inhale 2 puffs into the lungs every 4 (four) hours as needed for wheezing or shortness of breath. Patient not taking: Reported on 09/03/2015 04/04/14   Tresa Garter, MD  hydrOXYzine (ATARAX/VISTARIL) 25 MG tablet Take 1 tablet (25 mg total) by mouth 3 (three) times daily as needed for anxiety. 11/01/15   Tresa Garter, MD  PARoxetine (PAXIL) 20 MG tablet Take 1 tablet (20 mg total) by mouth daily. 11/01/15   Tresa Garter, MD  penicillin v potassium (VEETID) 500 MG tablet Take 1 tablet (500 mg total) by mouth 4 (four) times daily. 12/17/15 12/24/15  Jaime Pilcher Ward, PA-C   BP 130/89 mmHg  Pulse 96  Temp(Src) 98 F (36.7 C) (Oral)  Resp 16  Ht 4\' 9"  (1.448 m)  Wt 70.761 kg  BMI 33.75 kg/m2  SpO2 100%  LMP 12/13/2015 (Approximate) Physical Exam  Constitutional: She is oriented to person, place, and time. She appears well-developed and well-nourished. No distress.  HENT:  Head: Normocephalic and atraumatic.  Mouth/Throat:    Pain along area as depicted in image, midline uvula, no trismus, oropharynx moist and clear, no abscess noted, no oropharyngeal erythema or edema, neck supple and no tenderness. No facial edema  Neck: Neck supple. No tracheal deviation present.  No tenderness to palpation.  Cardiovascular: Normal rate, regular rhythm and normal heart sounds.   Pulmonary/Chest: Effort normal and breath sounds normal. No respiratory distress.  Musculoskeletal: Normal range of motion.  Neurological: She is alert and oriented to person, place, and time.  Skin: Skin is warm and dry.  Psychiatric: She has a normal mood and affect. Her behavior is normal.  Nursing note and vitals reviewed.   ED Course  Procedures (including critical care time)  DIAGNOSTIC STUDIES: Oxygen Saturation is 100% on RA, normal by my interpretation.    COORDINATION OF  CARE: 9:10 PM Discussed treatment plan with pt at bedside and pt agreed to plan.  Labs Review Labs Reviewed - No data to display  Imaging Review No results found. I have personally reviewed and evaluated these images and lab results as part of my medical decision-making.   EKG Interpretation None      MDM   Final diagnoses:  Dentalgia   Patient with dentalgia. No abscess requiring immediate incision and drainage. Patient is afebrile, non toxic appearing, and swallowing secretions well. Exam not concerning for Ludwig's angina or pharyngeal abscess. Will treat with PenVK. I provided dental resource guide and stressed the importance of dental follow up for ultimate management of dental pain. Patient voices understanding and is agreeable to plan.   I personally performed the services described in this documentation, which was scribed in my presence. The recorded information has been reviewed and is accurate.   [facility] Ward, PA-C 12/17/15 2121  Leo Grosser, MD 12/18/15 [phone]

## 2015-12-17 NOTE — Discharge Instructions (Signed)
You have a dental injury. It is very important that you get evaluated by a dentist as soon as possible. Call tomorrow to schedule an appointment. Ibuprofen as needed for pain. Take your full course of antibiotics. Read the instructions below.  Eat a soft or liquid diet and rinse your mouth out after meals with warm water. You should see a dentist or return here at once if you have increased swelling, increased pain or uncontrolled bleeding from the site of your injury.  SEEK MEDICAL CARE IF:   You have increased pain not controlled with medicines.   You have swelling around your tooth, in your face or neck.   You have bleeding which starts, continues, or gets worse.   You have a fever >101  If you are unable to open your mouth  [place] [employer] is a great source of information about community services available.  Access by dialing 2-1-1 from anywhere in [place], or by website -  CustodianSupply.fi.   Other Local Resources (Updated 07/2015)  Dental  Care   Services    Phone Number and Address  Cost  [facility] For children 75 - 102 years of age:   Cleaning  Tooth brushing/flossing instruction  Sealants, fillings, crowns  Extractions  Emergency treatment  [phone] [address], [place] [address] Charges based on family income.  Medicaid and some insurance plans accepted.     [facility] - [facility], fillings, crowns  Extractions  Emergency treatment [phone] [address], [place]  Pregnant women 55 years of age or older with a Medicaid card  [facility] - [place]  Cleaning  Sealants, fillings, crowns  Extractions  Emergency treatment [phone] [address] [place], [place] Pregnant women 27 years of age or older with a Medicaid card  [facility] For children 51 - 58 years of age:   Cleaning  Tooth brushing/flossing instruction  Sealants, fillings, crowns  Extractions  Emergency treatment Limited orthodontic services for patients with Medicaid [phone] [address], [place] [address] Medicaid and [facility] Health Choice cover for children up to age 36 and pregnant women.  Parents of children up to age 48 without Medicaid pay a reduced fee at time of service.  [place] For children 79 - 33 years of age:   Cleaning  Tooth brushing/flossing instruction  Sealants, fillings, crowns  Extractions  Emergency treatment Limited orthodontic services for patients with Medicaid [phone] [place], [place].  Medicaid and [place] Health Choice cover for children up to age 7 and pregnant women.  Parents of children up to age 52 without Medicaid pay a reduced fee.  [facility]  Sealants, fillings, crowns  Extractions  Hours: Tuesdays and Thursdays, 4:15 - 8 pm [phone] [address], [place], [place] [address] Services free of charge to [facility] residents ages 18-64 who do not have health insurance, Medicare, [place], or [place] benefits and fall within [place] care in addition to primary medical care, nutritional counseling, and pharmacy:  Cleaning  Sealants, fillings, crowns  Extractions  [place], [place], [place], [place], [place], [place] [facility], [place], [place] [facility] [place], [place], [place], most insurance.  Also provides services available to all with fees adjusted based on ability to pay.    [facility]  Cleaning  Tooth brushing/flossing instruction  Sealants, fillings, crowns  Extractions  Emergency treatment Hours: Tuesdays, Thursdays, and Fridays from 8 am to 5 pm by appointment only. [phone] [address] [place], [place] [address] [facility] residents with Medicaid (depending on eligibility) and children with [facility] Health Choice - call for more information.  [facility]  Extractions only  Hours: 2nd and 4th Thursday of each month from 6:30 am - 9 am.   [phone] ext. [place], [place] [address] Ages 47 and older only.  Patients are seen on a first come, first served basis.  [employer]  [employer]  Extractions  Orthodontics  Endodontics  Implants/Crowns/Bridges  Complete and partial dentures [phone] [place], [place] Patients must complete an application for services.  There is often a waiting list.

## 2015-12-17 NOTE — ED Notes (Signed)
Pt. reports left lower molar pain onset today .

## 2015-12-26 MED FILL — ?HYDROXYZINE HCL 25 MG TAB: 25 | 20 days supply | Qty: 60 | Fill #1

## 2015-12-26 MED FILL — ?PAROXETINE 20 MG TABLET: 20 MG | 30 days supply | Qty: 30 | Fill #1

## 2016-03-26 ENCOUNTER — Encounter (HOSPITAL_COMMUNITY): Payer: Self-pay | Admitting: Emergency Medicine

## 2016-03-26 ENCOUNTER — Emergency Department (HOSPITAL_COMMUNITY)
Admission: EM | Admit: 2016-03-26 | Discharge: 2016-03-26 | Disposition: A | Discharge status: Home / Self Care | Payer: Medicaid Other | Attending: Physician Assistant | Admitting: Physician Assistant

## 2016-03-26 DIAGNOSIS — Z79899 Other long term (current) drug therapy: Secondary | ICD-10-CM | POA: Insufficient documentation

## 2016-03-26 DIAGNOSIS — Z3490 Encounter for supervision of normal pregnancy, unspecified, unspecified trimester: Secondary | ICD-10-CM

## 2016-03-26 DIAGNOSIS — Z3201 Encounter for pregnancy test, result positive: Secondary | ICD-10-CM | POA: Diagnosis not present

## 2016-03-26 DIAGNOSIS — F17210 Nicotine dependence, cigarettes, uncomplicated: Secondary | ICD-10-CM | POA: Diagnosis not present

## 2016-03-26 DIAGNOSIS — J45909 Unspecified asthma, uncomplicated: Secondary | ICD-10-CM | POA: Diagnosis not present

## 2016-03-26 LAB — URINALYSIS, ROUTINE W REFLEX MICROSCOPIC
Bilirubin Urine: NEGATIVE
Glucose, UA: NEGATIVE mg/dL
Hgb urine dipstick: NEGATIVE
Ketones, ur: NEGATIVE mg/dL
NITRITE: NEGATIVE
Protein, ur: NEGATIVE mg/dL
SPECIFIC GRAVITY, URINE: 1.025 (ref 1.005–1.030)
pH: 6.5 (ref 5.0–8.0)

## 2016-03-26 LAB — URINE MICROSCOPIC-ADD ON

## 2016-03-26 LAB — POC URINE PREG, ED: Preg Test, Ur: POSITIVE — AB

## 2016-03-26 NOTE — ED Triage Notes (Signed)
Pt sts here for pregnancy test; pt sts took home pregnancy test that was positive and LMP was in July

## 2016-03-26 NOTE — ED Notes (Signed)
E signature not available patient denies concerns with dc

## 2016-03-26 NOTE — ED Provider Notes (Signed)
[facility] DEPT Provider Note   CSN: [id number] Arrival date & time: 03/26/16  1408  By signing my name below, I, Sonum Patel, attest that this documentation has been prepared under the direction and in the presence of Glendell Docker, NP. Electronically Signed: Sonum Patel, Education administrator. 03/26/16. 3:47 PM.  History   Chief Complaint Chief Complaint  Patient presents with  . Possible Pregnancy     The history is provided by the patient. No language interpreter was used.    HPI Comments: Elizabeth Dean is a 35 y.o. female who presents to the Emergency Department requesting a pregnancy test today. Patient had 2 positive pregnancy tests at home and came to get confirmation. She denies any symptoms or complaints at this time. Her last menstrual period was in July 2017 but unsure if at the beginning or end of month.   Past Medical History:  Diagnosis Date  . Anxiety   . Asthma   . Bronchitis   . Depression   . Panic attacks     Patient Active Problem List   Diagnosis Date Noted  . Possible pregnancy 09/03/2015  . Healthcare maintenance 09/03/2015  . Hypertriglyceridemia 04/04/2014  . Asthma with acute exacerbation 04/04/2014  . Need for prophylactic vaccination and inoculation against influenza 04/04/2014  . Adjustment disorder with mixed anxiety and depressed mood 02/14/2014  . Muscle spasm 01/02/2014  . Major depression ([place]) 01/02/2014  . Multiple falls 01/02/2014    Past Surgical History:  Procedure Laterality Date  . CHOLECYSTECTOMY    . TUBAL LIGATION      OB History    Gravida Para Term Preterm AB Living   1             SAB TAB Ectopic Multiple Live Births                   Home Medications    Prior to Admission medications   Medication Sig Start Date End Date Taking? Authorizing Provider  albuterol (PROVENTIL HFA;VENTOLIN HFA) 108 (90 BASE) MCG/ACT inhaler Inhale 2 puffs into the lungs every 4 (four) hours as needed for wheezing or shortness of breath.  04/04/14   Tresa Garter, MD  hydrOXYzine (ATARAX/VISTARIL) 25 MG tablet Take 1 tablet (25 mg total) by mouth 3 (three) times daily as needed for anxiety. Patient not taking: Reported on 12/17/2015 11/01/15   Tresa Garter, MD  PARoxetine (PAXIL) 20 MG tablet Take 1 tablet (20 mg total) by mouth daily. Patient not taking: Reported on 12/17/2015 11/01/15   Tresa Garter, MD    Family History Family History  Problem Relation Age of Onset  . Heart disease Maternal Grandmother   . Hypertension Maternal Grandmother     Social History Social History  Substance Use Topics  . Smoking status: Current Every Day Smoker    Packs/day: 0.00    Types: Cigarettes  . Smokeless tobacco: Not on file  . Alcohol use Yes     Comment: socially     Allergies   Hydrocodone and Codeine   Review of Systems Review of Systems  Constitutional:       +pregnancy confirmation  All other systems reviewed and are negative.    Physical Exam Updated Vital Signs BP 109/78 (BP Location: Right Arm)   Pulse 91   Temp 98.1 F (36.7 C) (Oral)   Resp 19   Ht 4\' 9"  (1.448 m)   Wt 165 lb (74.8 kg)   LMP  (LMP Unknown)  SpO2 100%   BMI 35.71 kg/m   Physical Exam  Constitutional: She is oriented to person, place, and time. She appears well-developed and well-nourished. No distress.  HENT:  Head: Normocephalic and atraumatic.  Eyes: Conjunctivae and EOM are normal.  Neck: Neck supple. No tracheal deviation present.  Cardiovascular: Normal rate.   Pulmonary/Chest: Effort normal. No respiratory distress.  Abdominal: Soft. There is no tenderness.  Musculoskeletal: Normal range of motion.  Neurological: She is alert and oriented to person, place, and time.  Skin: Skin is warm and dry.  Psychiatric: She has a normal mood and affect. Her behavior is normal.  Nursing note and vitals reviewed.    ED Treatments / Results  DIAGNOSTIC STUDIES: Oxygen Saturation is 100% on RA, normal by my  interpretation.    COORDINATION OF CARE: 3:50 PM Discussed treatment plan with pt at bedside and pt agreed to plan.   Labs (all labs ordered are listed, but only abnormal results are displayed) Labs Reviewed  POC URINE PREG, ED - Abnormal; Notable for the following:       Result Value   Preg Test, Ur POSITIVE (*)    All other components within normal limits  URINALYSIS, ROUTINE W REFLEX MICROSCOPIC (NOT AT [facility])    EKG  EKG Interpretation None       Radiology No results found.  Procedures Procedures (including critical care time)  Medications Ordered in ED Medications - No data to display   Initial Impression / Assessment and Plan / ED Course  I have reviewed the triage vital signs and the nursing notes.  Pertinent labs & imaging results that were available during my care of the patient were reviewed by me and considered in my medical decision making (see chart for details).  Clinical Course    Confirmed pregnancy. Pt is not having any pain  Final Clinical Impressions(s) / ED Diagnoses   Final diagnoses:  None    New Prescriptions New Prescriptions   No medications on file   I personally performed the services described in this documentation, which was scribed in my presence. The recorded information has been reviewed and is accurate.    Glendell Docker, NP 03/26/16 [place], MD 03/28/16 1102

## 2016-03-31 ENCOUNTER — Telehealth: Payer: Self-pay | Admitting: Internal Medicine

## 2016-03-31 NOTE — Telephone Encounter (Signed)
Pt. Called and would like to be referred out to an OBGYN b/c she is pregnant. Please f/u

## 2016-03-31 NOTE — Telephone Encounter (Signed)
Patient verified DOB Patient is aware of a referral not being placed for pregnancy. Patient does not have insurance at this time. Patient advised to contact the Health Dept or Family Medicine for confirmation of pregnancy and take with her to DSS to complete. Patient was also advised of calling facilities to be sure they are accepting Medicaid patients at this time. Patient had no further questions

## 2016-04-02 ENCOUNTER — Encounter: Payer: Self-pay | Admitting: Family Medicine

## 2016-04-02 ENCOUNTER — Ambulatory Visit (INDEPENDENT_AMBULATORY_CARE_PROVIDER_SITE_OTHER): Payer: Self-pay

## 2016-04-02 DIAGNOSIS — Z3201 Encounter for pregnancy test, result positive: Secondary | ICD-10-CM

## 2016-04-02 DIAGNOSIS — Z3200 Encounter for pregnancy test, result unknown: Secondary | ICD-10-CM

## 2016-04-02 LAB — POCT PREGNANCY, URINE: PREG TEST UR: POSITIVE — AB

## 2016-04-02 NOTE — Progress Notes (Signed)
Patient presented to office today for a pregnancy test. Test confirms she is pregnant around 10 weeks. Patient has been given a letter of verification to apply for medicaid.She will follow up with her ob provider of choice.

## 2016-04-03 ENCOUNTER — Emergency Department (HOSPITAL_COMMUNITY): Payer: Medicaid Other

## 2016-04-03 ENCOUNTER — Emergency Department (HOSPITAL_COMMUNITY)
Admission: EM | Admit: 2016-04-03 | Discharge: 2016-04-03 | Disposition: A | Discharge status: Home / Self Care | Payer: Medicaid Other | Attending: Physician Assistant | Admitting: Physician Assistant

## 2016-04-03 ENCOUNTER — Encounter (HOSPITAL_COMMUNITY): Payer: Self-pay | Admitting: *Deleted

## 2016-04-03 DIAGNOSIS — F17210 Nicotine dependence, cigarettes, uncomplicated: Secondary | ICD-10-CM | POA: Insufficient documentation

## 2016-04-03 DIAGNOSIS — J45909 Unspecified asthma, uncomplicated: Secondary | ICD-10-CM | POA: Diagnosis not present

## 2016-04-03 DIAGNOSIS — Z3A10 10 weeks gestation of pregnancy: Secondary | ICD-10-CM | POA: Diagnosis not present

## 2016-04-03 DIAGNOSIS — O200 Threatened abortion: Secondary | ICD-10-CM | POA: Diagnosis present

## 2016-04-03 DIAGNOSIS — N939 Abnormal uterine and vaginal bleeding, unspecified: Secondary | ICD-10-CM

## 2016-04-03 LAB — CBC WITH DIFFERENTIAL/PLATELET
BASOS ABS: 0 10*3/uL (ref 0.0–0.1)
BASOS PCT: 0 %
EOS PCT: 1 %
Eosinophils Absolute: 0.1 10*3/uL (ref 0.0–0.7)
HCT: 39.3 % (ref 36.0–46.0)
Hemoglobin: 12.2 g/dL (ref 12.0–15.0)
LYMPHS PCT: 34 %
Lymphs Abs: 4 10*3/uL (ref 0.7–4.0)
MCH: 26.7 pg (ref 26.0–34.0)
MCHC: 31 g/dL (ref 30.0–36.0)
MCV: 86 fL (ref 78.0–100.0)
Monocytes Absolute: 0.7 10*3/uL (ref 0.1–1.0)
Monocytes Relative: 6 %
Neutro Abs: 7 10*3/uL (ref 1.7–7.7)
Neutrophils Relative %: 59 %
PLATELETS: 275 10*3/uL (ref 150–400)
RBC: 4.57 MIL/uL (ref 3.87–5.11)
RDW: 15.8 % — ABNORMAL HIGH (ref 11.5–15.5)
WBC: 11.8 10*3/uL — AB (ref 4.0–10.5)

## 2016-04-03 LAB — ABO/RH: ABO/RH(D): B POS

## 2016-04-03 LAB — HCG, QUANTITATIVE, PREGNANCY: HCG, BETA CHAIN, QUANT, S: 102 m[IU]/mL — AB (ref <5)

## 2016-04-03 NOTE — ED Triage Notes (Signed)
Pt states she was told by women[facility] she was [redacted] weeks pregnant (pos home pregnancy test too).  Was seen at [facility] yesterday for check-up while she waits to see an obgyn at the Health [facility].  Last night she noticed light bleeding.  This morning she noticed larger clots.  Denies cramping.  Has only used 2 light pads since bleeding began.

## 2016-04-03 NOTE — ED Notes (Signed)
Pt is discharging home. Summary and education was provided to pt and family. Denies any questions

## 2016-04-03 NOTE — Discharge Instructions (Signed)
we think that you're likely having a miscarriage. Please follow-up with [facility] soon as possible.

## 2016-04-03 NOTE — ED Provider Notes (Signed)
[facility] DEPT Provider Note   CSN: [id number] Arrival date & time: 04/03/16  1247     History   Chief Complaint Chief Complaint  Patient presents with  . Vaginal Bleeding    [redacted] weeks pregnant    HPI Elizabeth Dean is a 35 y.o. female.  The history is provided by the patient.  Vaginal Bleeding  Primary symptoms include vaginal bleeding. There has been no fever. The fever has been present for less than 1 day. This is a new problem. The current episode started 6 to 12 hours ago. Episode frequency: intermittently. The problem has not changed since onset.She is pregnant. Her LMP was weeks ago. Prenatal care has been sporadic. Pertinent negatives include no abdominal pain and no nausea. She has tried nothing for the symptoms. The treatment provided no relief. She uses nothing for contraception. Associated medical issues do not include PID or miscarriage.    Past Medical History:  Diagnosis Date  . Anxiety   . Asthma   . Bronchitis   . Depression   . Panic attacks     Patient Active Problem List   Diagnosis Date Noted  . Possible pregnancy 09/03/2015  . Healthcare maintenance 09/03/2015  . Hypertriglyceridemia 04/04/2014  . Asthma with acute exacerbation 04/04/2014  . Need for prophylactic vaccination and inoculation against influenza 04/04/2014  . Adjustment disorder with mixed anxiety and depressed mood 02/14/2014  . Muscle spasm 01/02/2014  . Major depression ([place]) 01/02/2014  . Multiple falls 01/02/2014    Past Surgical History:  Procedure Laterality Date  . CHOLECYSTECTOMY    . TUBAL LIGATION      OB History    Gravida Para Term Preterm AB Living   1             SAB TAB Ectopic Multiple Live Births                   Home Medications    Prior to Admission medications   Medication Sig Start Date End Date Taking? Authorizing Provider  albuterol (PROVENTIL HFA;VENTOLIN HFA) 108 (90 BASE) MCG/ACT inhaler Inhale 2 puffs into the lungs every 4 (four)  hours as needed for wheezing or shortness of breath. Patient not taking: Reported on 04/02/2016 04/04/14   Tresa Garter, MD  hydrOXYzine (ATARAX/VISTARIL) 25 MG tablet Take 1 tablet (25 mg total) by mouth 3 (three) times daily as needed for anxiety. Patient not taking: Reported on 04/02/2016 11/01/15   Tresa Garter, MD  PARoxetine (PAXIL) 20 MG tablet Take 1 tablet (20 mg total) by mouth daily. Patient not taking: Reported on 04/02/2016 11/01/15   Tresa Garter, MD    Family History Family History  Problem Relation Age of Onset  . Heart disease Maternal Grandmother   . Hypertension Maternal Grandmother     Social History Social History  Substance Use Topics  . Smoking status: Current Some Day Smoker    Packs/day: 0.00    Types: Cigarettes  . Smokeless tobacco: Never Used  . Alcohol use Yes     Comment: socially     Allergies   Hydrocodone and Codeine   Review of Systems Review of Systems  Constitutional: Negative for fatigue and fever.  Gastrointestinal: Negative for abdominal pain and nausea.  Genitourinary: Positive for vaginal bleeding.  All other systems reviewed and are negative.    Physical Exam Updated Vital Signs BP 137/74 (BP Location: Right Arm)   Pulse 87   Temp 99.4 F (37.4 C) (Oral)  Resp 18   Ht 5' (1.524 m)   Wt 165 lb (74.8 kg)   LMP 01/27/2016   SpO2 100%   BMI 32.22 kg/m   Physical Exam  Constitutional: She is oriented to person, place, and time. She appears well-developed and well-nourished.  HENT:  Head: Normocephalic and atraumatic.  Eyes: Right eye exhibits no discharge.  Cardiovascular: Normal rate, regular rhythm and normal heart sounds.   No murmur heard. Pulmonary/Chest: Effort normal and breath sounds normal. She has no wheezes. She has no rales.  Abdominal: Soft. She exhibits no distension. There is no tenderness.  Genitourinary: Vagina normal.  Genitourinary Comments: Sig about of bleeding Cervix open     Neurological: She is oriented to person, place, and time.  Skin: Skin is warm and dry. She is not diaphoretic.  Psychiatric: She has a normal mood and affect.  Nursing note and vitals reviewed.    ED Treatments / Results  Labs (all labs ordered are listed, but only abnormal results are displayed) Labs Reviewed  HCG, QUANTITATIVE, PREGNANCY - Abnormal; Notable for the following:       Result Value   hCG, Beta Chain, Quant, S 102 (*)    All other components within normal limits  CBC WITH DIFFERENTIAL/PLATELET - Abnormal; Notable for the following:    WBC 11.8 (*)    RDW 15.8 (*)    All other components within normal limits    EKG  EKG Interpretation None       Radiology No results found.  Procedures Procedures (including critical care time)  Medications Ordered in ED Medications - No data to display   Initial Impression / Assessment and Plan / ED Course  I have reviewed the triage vital signs and the nursing notes.  Pertinent labs & imaging results that were available during my care of the patient were reviewed by me and considered in my medical decision making (see chart for details).  Clinical Course   Pt is a 35 yo G5.P4 ) within (presenting [redacted] [facility]. She began with bleeding today. Patient says it's been about mild bleeding. Patient doesn't know her ABO status. However she has never heard the word Rhogam so I suspect that she is not rh-. We will get ABO type. We'll do vaginal exam and transvaginal ultrasound.  Vag exam shows open os, hCG low. Will get TVUS, diagnose with inevitable abortion.  Counseled patient.   6:44 PM Patient and female in the room were given the results of the tests. Told [facility] for [facility]. Given the opportunity to ask questions and return precautions advised.   Final Clinical Impressions(s) / ED Diagnoses   Final diagnoses:  None    New  Prescriptions New Prescriptions   No medications on file     Maxie Debose Julio Alm, MD 04/03/16 1845

## 2016-04-07 ENCOUNTER — Other Ambulatory Visit: Payer: Self-pay

## 2016-04-07 DIAGNOSIS — O200 Threatened abortion: Secondary | ICD-10-CM

## 2016-04-08 LAB — HCG, QUANTITATIVE, PREGNANCY: hCG, Beta Chain, Quant, S: 54.7 m[IU]/mL — ABNORMAL HIGH

## 2016-04-09 ENCOUNTER — Telehealth: Payer: Self-pay | Admitting: *Deleted

## 2016-04-09 NOTE — Telephone Encounter (Signed)
Pt called office and requested her test results from 9/18.  I advised that the pregnancy hormone level has decreased which is indicative of a miscarriage.  She will need to have the level checked again next week.  Pt voiced understanding and agreed to lab appt on 9/25 @ 1000.

## 2016-04-14 ENCOUNTER — Other Ambulatory Visit: Payer: Self-pay

## 2016-04-14 DIAGNOSIS — O3680X0 Pregnancy with inconclusive fetal viability, not applicable or unspecified: Secondary | ICD-10-CM

## 2016-04-14 LAB — HCG, QUANTITATIVE, PREGNANCY

## 2016-05-15 ENCOUNTER — Emergency Department (HOSPITAL_COMMUNITY): Payer: Medicaid Other

## 2016-05-15 ENCOUNTER — Encounter (HOSPITAL_COMMUNITY): Payer: Self-pay

## 2016-05-15 ENCOUNTER — Emergency Department (HOSPITAL_COMMUNITY)
Admission: EM | Admit: 2016-05-15 | Discharge: 2016-05-15 | Disposition: A | Discharge status: Home / Self Care | Payer: Medicaid Other | Attending: Emergency Medicine | Admitting: Emergency Medicine

## 2016-05-15 DIAGNOSIS — S0512XA Contusion of eyeball and orbital tissues, left eye, initial encounter: Secondary | ICD-10-CM | POA: Diagnosis not present

## 2016-05-15 DIAGNOSIS — Y939 Activity, unspecified: Secondary | ICD-10-CM | POA: Insufficient documentation

## 2016-05-15 DIAGNOSIS — Y92521 Bus station as the place of occurrence of the external cause: Secondary | ICD-10-CM | POA: Diagnosis not present

## 2016-05-15 DIAGNOSIS — Y999 Unspecified external cause status: Secondary | ICD-10-CM | POA: Diagnosis not present

## 2016-05-15 DIAGNOSIS — R51 Headache: Secondary | ICD-10-CM | POA: Diagnosis not present

## 2016-05-15 DIAGNOSIS — F17210 Nicotine dependence, cigarettes, uncomplicated: Secondary | ICD-10-CM | POA: Diagnosis not present

## 2016-05-15 DIAGNOSIS — S0592XA Unspecified injury of left eye and orbit, initial encounter: Secondary | ICD-10-CM | POA: Diagnosis present

## 2016-05-15 DIAGNOSIS — J45909 Unspecified asthma, uncomplicated: Secondary | ICD-10-CM | POA: Diagnosis not present

## 2016-05-15 DIAGNOSIS — H1132 Conjunctival hemorrhage, left eye: Secondary | ICD-10-CM

## 2016-05-15 LAB — POC URINE PREG, ED: Preg Test, Ur: NEGATIVE

## 2016-05-15 MED ORDER — OXYCODONE-ACETAMINOPHEN 5-325 MG PO TABS
2.0000 | ORAL_TABLET | Freq: Once | ORAL | Status: AC
  Administered 2016-05-15: 2 via ORAL
  Filled 2016-05-15: qty 2

## 2016-05-15 MED ORDER — METHOCARBAMOL 500 MG PO TABS: 1000.0000 mg | ORAL_TABLET | Freq: Two times a day (BID) | ORAL | 0 refills | Status: DC

## 2016-05-15 MED ORDER — METHOCARBAMOL 500 MG PO TABS
1000.0000 mg | ORAL_TABLET | Freq: Once | ORAL | Status: AC
  Administered 2016-05-15: 1000 mg via ORAL
  Filled 2016-05-15: qty 2

## 2016-05-15 MED ORDER — IBUPROFEN 800 MG PO TABS: 800.0000 mg | ORAL_TABLET | Freq: Three times a day (TID) | ORAL | 0 refills | Status: DC

## 2016-05-15 MED ORDER — OXYCODONE-ACETAMINOPHEN 5-325 MG PO TABS: 1.0000 | ORAL_TABLET | Freq: Four times a day (QID) | ORAL | 0 refills | Status: DC | PRN

## 2016-05-15 NOTE — ED Notes (Signed)
Pt tearful when updating vitals.
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
## 2016-05-15 NOTE — ED Notes (Signed)
Patient transported to CT 

## 2016-05-15 NOTE — ED Notes (Signed)
Pt. returned from XR. 
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
## 2016-05-18 ENCOUNTER — Emergency Department (HOSPITAL_COMMUNITY)
Admission: EM | Admit: 2016-05-18 | Discharge: 2016-05-18 | Disposition: A | Discharge status: Home / Self Care | Payer: Medicaid Other | Attending: Emergency Medicine | Admitting: Emergency Medicine

## 2016-05-18 ENCOUNTER — Encounter (HOSPITAL_COMMUNITY): Payer: Self-pay

## 2016-05-18 DIAGNOSIS — S0990XD Unspecified injury of head, subsequent encounter: Secondary | ICD-10-CM | POA: Diagnosis present

## 2016-05-18 DIAGNOSIS — J45909 Unspecified asthma, uncomplicated: Secondary | ICD-10-CM | POA: Insufficient documentation

## 2016-05-18 DIAGNOSIS — H1132 Conjunctival hemorrhage, left eye: Secondary | ICD-10-CM | POA: Diagnosis not present

## 2016-05-18 DIAGNOSIS — Z79899 Other long term (current) drug therapy: Secondary | ICD-10-CM | POA: Insufficient documentation

## 2016-05-18 DIAGNOSIS — S0093XD Contusion of unspecified part of head, subsequent encounter: Secondary | ICD-10-CM | POA: Insufficient documentation

## 2016-05-18 DIAGNOSIS — F17210 Nicotine dependence, cigarettes, uncomplicated: Secondary | ICD-10-CM | POA: Diagnosis not present

## 2016-05-18 MED ORDER — IBUPROFEN 800 MG PO TABS
800.0000 mg | ORAL_TABLET | Freq: Once | ORAL | Status: AC
  Administered 2016-05-18: 800 mg via ORAL
  Filled 2016-05-18: qty 1
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
## 2016-06-05 ENCOUNTER — Inpatient Hospital Stay: Payer: Medicaid Other | Admitting: Internal Medicine
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
## 2017-02-17 ENCOUNTER — Emergency Department (HOSPITAL_COMMUNITY)
Admission: EM | Admit: 2017-02-17 | Discharge: 2017-02-17 | Discharge status: Against Medical Advice | Payer: Medicaid Other | Attending: Emergency Medicine | Admitting: Emergency Medicine

## 2017-02-17 ENCOUNTER — Encounter (HOSPITAL_COMMUNITY): Payer: Self-pay | Admitting: Emergency Medicine

## 2017-02-17 DIAGNOSIS — R1084 Generalized abdominal pain: Secondary | ICD-10-CM | POA: Insufficient documentation

## 2017-02-17 DIAGNOSIS — Z5321 Procedure and treatment not carried out due to patient leaving prior to being seen by health care provider: Secondary | ICD-10-CM | POA: Diagnosis not present

## 2017-02-17 LAB — COMPREHENSIVE METABOLIC PANEL
ALT: 37 U/L (ref 14–54)
ANION GAP: 7 (ref 5–15)
AST: 17 U/L (ref 15–41)
Albumin: 3.6 g/dL (ref 3.5–5.0)
Alkaline Phosphatase: 62 U/L (ref 38–126)
BILIRUBIN TOTAL: 0.7 mg/dL (ref 0.3–1.2)
BUN: 10 mg/dL (ref 6–20)
CALCIUM: 8.7 mg/dL — AB (ref 8.9–10.3)
CO2: 21 mmol/L — AB (ref 22–32)
CREATININE: 0.8 mg/dL (ref 0.44–1.00)
Chloride: 108 mmol/L (ref 101–111)
GFR calc non Af Amer: >60 mL/min (ref >60)
GLUCOSE: 97 mg/dL (ref 65–99)
Potassium: 3.5 mmol/L (ref 3.5–5.1)
SODIUM: 136 mmol/L (ref 135–145)
TOTAL PROTEIN: 6.8 g/dL (ref 6.5–8.1)

## 2017-02-17 LAB — CBC
HCT: 39.8 % (ref 36.0–46.0)
Hemoglobin: 12.7 g/dL (ref 12.0–15.0)
MCH: 26.6 pg (ref 26.0–34.0)
MCHC: 31.9 g/dL (ref 30.0–36.0)
MCV: 83.4 fL (ref 78.0–100.0)
Platelets: 298 10*3/uL (ref 150–400)
RBC: 4.77 MIL/uL (ref 3.87–5.11)
RDW: 16.3 % — ABNORMAL HIGH (ref 11.5–15.5)
WBC: 11.2 10*3/uL — ABNORMAL HIGH (ref 4.0–10.5)

## 2017-02-17 LAB — LIPASE, BLOOD: Lipase: 29 U/L (ref 11–51)
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
## 2017-02-17 NOTE — ED Triage Notes (Signed)
Pt. Stated, I started having cramping and then bouts of diarrhea for the last 2 days.

## 2017-02-19 ENCOUNTER — Emergency Department (HOSPITAL_COMMUNITY): Payer: Self-pay

## 2017-02-19 ENCOUNTER — Emergency Department (HOSPITAL_COMMUNITY)
Admission: EM | Admit: 2017-02-19 | Discharge: 2017-02-19 | Disposition: A | Discharge status: Home / Self Care | Payer: Self-pay | Attending: Emergency Medicine | Admitting: Emergency Medicine

## 2017-02-19 ENCOUNTER — Encounter (HOSPITAL_COMMUNITY): Payer: Self-pay

## 2017-02-19 DIAGNOSIS — O0281 Inappropriate change in quantitative human chorionic gonadotropin (hCG) in early pregnancy: Secondary | ICD-10-CM | POA: Insufficient documentation

## 2017-02-19 DIAGNOSIS — R110 Nausea: Secondary | ICD-10-CM

## 2017-02-19 DIAGNOSIS — J45909 Unspecified asthma, uncomplicated: Secondary | ICD-10-CM | POA: Insufficient documentation

## 2017-02-19 DIAGNOSIS — R112 Nausea with vomiting, unspecified: Secondary | ICD-10-CM | POA: Insufficient documentation

## 2017-02-19 DIAGNOSIS — R197 Diarrhea, unspecified: Secondary | ICD-10-CM | POA: Insufficient documentation

## 2017-02-19 DIAGNOSIS — E349 Endocrine disorder, unspecified: Secondary | ICD-10-CM

## 2017-02-19 DIAGNOSIS — F17210 Nicotine dependence, cigarettes, uncomplicated: Secondary | ICD-10-CM | POA: Insufficient documentation

## 2017-02-19 LAB — URINALYSIS, ROUTINE W REFLEX MICROSCOPIC
Bilirubin Urine: NEGATIVE
GLUCOSE, UA: NEGATIVE mg/dL
Hgb urine dipstick: NEGATIVE
KETONES UR: NEGATIVE mg/dL
Leukocytes, UA: NEGATIVE
Nitrite: NEGATIVE
PH: 5 (ref 5.0–8.0)
Protein, ur: 30 mg/dL — AB
Specific Gravity, Urine: 1.023 (ref 1.005–1.030)

## 2017-02-19 LAB — COMPREHENSIVE METABOLIC PANEL
ALK PHOS: 63 U/L (ref 38–126)
ALT: 26 U/L (ref 14–54)
AST: 21 U/L (ref 15–41)
Albumin: 3.8 g/dL (ref 3.5–5.0)
Anion gap: 9 (ref 5–15)
BUN: 8 mg/dL (ref 6–20)
CALCIUM: 8.9 mg/dL (ref 8.9–10.3)
CHLORIDE: 110 mmol/L (ref 101–111)
CO2: 18 mmol/L — AB (ref 22–32)
CREATININE: 0.73 mg/dL (ref 0.44–1.00)
GFR calc Af Amer: >60 mL/min (ref >60)
GFR calc non Af Amer: >60 mL/min (ref >60)
GLUCOSE: 89 mg/dL (ref 65–99)
Potassium: 3.8 mmol/L (ref 3.5–5.1)
SODIUM: 137 mmol/L (ref 135–145)
Total Bilirubin: 0.7 mg/dL (ref 0.3–1.2)
Total Protein: 7 g/dL (ref 6.5–8.1)

## 2017-02-19 LAB — CBC
HCT: 39.1 % (ref 36.0–46.0)
Hemoglobin: 12.8 g/dL (ref 12.0–15.0)
MCH: 27.3 pg (ref 26.0–34.0)
MCHC: 32.7 g/dL (ref 30.0–36.0)
MCV: 83.4 fL (ref 78.0–100.0)
PLATELETS: 316 10*3/uL (ref 150–400)
RBC: 4.69 MIL/uL (ref 3.87–5.11)
RDW: 16.2 % — AB (ref 11.5–15.5)
WBC: 12.3 10*3/uL — ABNORMAL HIGH (ref 4.0–10.5)

## 2017-02-19 LAB — LIPASE, BLOOD: LIPASE: 27 U/L (ref 11–51)

## 2017-02-19 LAB — WET PREP, GENITAL
Sperm: NONE SEEN
Trich, Wet Prep: NONE SEEN
Yeast Wet Prep HPF POC: NONE SEEN

## 2017-02-19 LAB — I-STAT BETA HCG BLOOD, ED (MC, WL, AP ONLY): I-stat hCG, quantitative: 11.7 m[IU]/mL — ABNORMAL HIGH (ref <5)

## 2017-02-19 LAB — HCG, QUANTITATIVE, PREGNANCY: hCG, Beta Chain, Quant, S: 13 m[IU]/mL — ABNORMAL HIGH (ref <5)

## 2017-02-19 MED ORDER — PRENATAL VITAMINS 0.8 MG PO TABS: 1.0000 | ORAL_TABLET | Freq: Every day | ORAL | 0 refills | Status: DC

## 2017-02-19 MED ORDER — METOCLOPRAMIDE HCL 10 MG PO TABS: 10.0000 mg | ORAL_TABLET | Freq: Four times a day (QID) | ORAL | 0 refills | Status: DC
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
## 2017-02-19 NOTE — ED Notes (Signed)
Patient remains in US.

## 2017-02-19 NOTE — ED Provider Notes (Signed)
[facility] DEPT Provider Note   CSN: [id number] Arrival date & time: 02/19/17  1213     History   Chief Complaint Chief Complaint  Patient presents with  . Abdominal Pain  . Diarrhea  . Emesis    HPI Elizabeth Dean is a 36 y.o. female who presents with 4 days of lower abdominal pain, diarrhea, and nausea. Patient reports she missed her period in July. Her last period was 12/20/2016. Patient reports frequent diarrhea for the past few days, is worse with eating. She denies any diarrhea. She has been around a sick contact. She denies any abnormal vaginal bleeding or discharge, urinary symptoms, fevers, chest pain, shortness of breath. She is not taking any medication at home for her symptoms.  HPI  Past Medical History:  Diagnosis Date  . Anxiety   . Asthma   . Bronchitis   . Depression   . Panic attacks     Patient Active Problem List   Diagnosis Date Noted  . Possible pregnancy 09/03/2015  . Healthcare maintenance 09/03/2015  . Hypertriglyceridemia 04/04/2014  . Asthma with acute exacerbation 04/04/2014  . Need for prophylactic vaccination and inoculation against influenza 04/04/2014  . Adjustment disorder with mixed anxiety and depressed mood 02/14/2014  . Muscle spasm 01/02/2014  . Major depression 01/02/2014  . Multiple falls 01/02/2014    Past Surgical History:  Procedure Laterality Date  . CHOLECYSTECTOMY    . TUBAL LIGATION      OB History    Gravida Para Term Preterm AB Living   1             SAB TAB Ectopic Multiple Live Births                   Home Medications    Prior to Admission medications   Medication Sig Start Date End Date Taking? Authorizing Provider  albuterol (PROVENTIL HFA;VENTOLIN HFA) 108 (90 BASE) MCG/ACT inhaler Inhale 2 puffs into the lungs every 4 (four) hours as needed for wheezing or shortness of breath. Patient not taking: Reported on 05/15/2016 04/04/14   Tresa Garter, MD  hydrOXYzine (ATARAX/VISTARIL) 25 MG  tablet Take 1 tablet (25 mg total) by mouth 3 (three) times daily as needed for anxiety. Patient not taking: Reported on 05/15/2016 11/01/15   Tresa Garter, MD  ibuprofen (ADVIL,MOTRIN) 800 MG tablet Take 1 tablet (800 mg total) by mouth 3 (three) times daily. 05/15/16   Marlee Armenteros, Bea Graff, PA-C  methocarbamol (ROBAXIN) 500 MG tablet Take 2 tablets (1,000 mg total) by mouth 2 (two) times daily. 05/15/16   Virl Coble, Bea Graff, PA-C  metoCLOPramide (REGLAN) 10 MG tablet Take 1 tablet (10 mg total) by mouth every 6 (six) hours. 02/19/17   Frederica Kuster, PA-C  oxyCODONE-acetaminophen (PERCOCET/ROXICET) 5-325 MG tablet Take 1-2 tablets by mouth every 6 (six) hours as needed for severe pain. 05/15/16   Jacara Benito, Bea Graff, PA-C  PARoxetine (PAXIL) 20 MG tablet Take 1 tablet (20 mg total) by mouth daily. Patient not taking: Reported on 05/15/2016 11/01/15   Tresa Garter, MD  Prenatal Multivit-Min-Fe-FA (PRENATAL VITAMINS) 0.8 MG tablet Take 1 tablet by mouth daily. 02/19/17   Frederica Kuster, PA-C    Family History Family History  Problem Relation Age of Onset  . Heart disease Maternal Grandmother   . Hypertension Maternal Grandmother     Social History Social History  Substance Use Topics  . Smoking status: Current Some Day Smoker    Packs/day: 1.00  Types: Cigarettes  . Smokeless tobacco: Never Used  . Alcohol use Yes     Comment: socially     Allergies   Hydrocodone and Codeine   Review of Systems Review of Systems  Constitutional: Negative for chills and fever.  HENT: Negative for facial swelling and sore throat.   Respiratory: Negative for shortness of breath.   Cardiovascular: Negative for chest pain.  Gastrointestinal: Positive for abdominal pain, diarrhea and nausea. Negative for blood in stool and vomiting.  Genitourinary: Positive for pelvic pain. Negative for dysuria.  Musculoskeletal: Negative for back pain.  Skin: Negative for rash and wound.  Neurological:  Negative for headaches.  Psychiatric/Behavioral: The patient is not nervous/anxious.      Physical Exam Updated Vital Signs BP 116/75 (BP Location: Right Arm)   Pulse 89   Temp 98.3 F (36.8 C) (Oral)   Resp 16   Ht 4\' 9"  (1.448 m)   Wt 72.6 kg (160 lb)   LMP 12/20/2016 (Exact Date)   SpO2 100%   Breastfeeding? Unknown   BMI 34.62 kg/m   Physical Exam  Constitutional: She appears well-developed and well-nourished. No distress.  HENT:  Head: Normocephalic and atraumatic.  Mouth/Throat: Oropharynx is clear and moist. No oropharyngeal exudate.  Eyes: Pupils are equal, round, and reactive to light. Conjunctivae are normal. Right eye exhibits no discharge. Left eye exhibits no discharge. No scleral icterus.  Neck: Normal range of motion. Neck supple. No thyromegaly present.  Cardiovascular: Normal rate, regular rhythm, normal heart sounds and intact distal pulses.  Exam reveals no gallop and no friction rub.   No murmur heard. Pulmonary/Chest: Effort normal and breath sounds normal. No stridor. No respiratory distress. She has no wheezes. She has no rales.  Abdominal: Soft. Bowel sounds are normal. She exhibits no distension. There is tenderness in the suprapubic area and left lower quadrant. There is no rebound and no guarding.  Genitourinary: Pelvic exam was performed with patient supine. Cervix exhibits no motion tenderness. Right adnexum displays no tenderness. Left adnexum displays tenderness. Vaginal discharge (small, probably physiologic) found.  Genitourinary Comments: Chaperone present  Musculoskeletal: She exhibits no edema.  Lymphadenopathy:    She has no cervical adenopathy.  Neurological: She is alert. Coordination normal.  Skin: Skin is warm and dry. No rash noted. She is not diaphoretic. No pallor.  Psychiatric: She has a normal mood and affect.  Nursing note and vitals reviewed.    ED Treatments / Results  Labs (all labs ordered are listed, but only abnormal  results are displayed) Labs Reviewed  WET PREP, GENITAL - Abnormal; Notable for the following:       Result Value   Clue Cells Wet Prep HPF POC PRESENT (*)    WBC, Wet Prep HPF POC MODERATE (*)    All other components within normal limits  COMPREHENSIVE METABOLIC PANEL - Abnormal; Notable for the following:    CO2 18 (*)    All other components within normal limits  CBC - Abnormal; Notable for the following:    WBC 12.3 (*)    RDW 16.2 (*)    All other components within normal limits  URINALYSIS, ROUTINE W REFLEX MICROSCOPIC - Abnormal; Notable for the following:    APPearance HAZY (*)    Protein, ur 30 (*)    Bacteria, UA FEW (*)    Squamous Epithelial / LPF 0-5 (*)    All other components within normal limits  HCG, QUANTITATIVE, PREGNANCY - Abnormal; Notable for the following:  hCG, Beta Chain, Quant, S 13 (*)    All other components within normal limits  I-STAT BETA HCG BLOOD, ED ([facility], [facility], [facility] ONLY) - Abnormal; Notable for the following:    I-stat hCG, quantitative 11.7 (*)    All other components within normal limits  LIPASE, BLOOD  RPR  HIV ANTIBODY (ROUTINE TESTING)  GC/CHLAMYDIA PROBE AMP ([place]) NOT AT [facility]    EKG  EKG Interpretation None       Radiology US Ob Comp < 14 Wks  Result Date: 02/19/2017 CLINICAL DATA:  Pregnant, cramping, beta HCG 13 EXAM: OBSTETRIC <14 WK [place] AND TRANSVAGINAL OB US TECHNIQUE: Both transabdominal and transvaginal ultrasound examinations were performed for complete evaluation of the gestation as well as the maternal uterus, adnexal regions, and pelvic cul-de-sac. Transvaginal technique was performed to assess early pregnancy. COMPARISON:  None. FINDINGS: Intrauterine gestational sac: None Yolk sac:  Not Visualized. Embryo:  Not Visualized. Subchorionic hemorrhage:  None visualized. Maternal uterus/adnexae: Endometrial complex measures 11 mm. 2.4 x 2.3 x 2.1 cm intramural fibroid in the anterior uterine body. Right ovary measures 3.5 x  1.7 x 3.1 cm and is notable for a 2.0 cm hemorrhagic corpus luteal cyst. Left ovary measures 3.4 x 1.9 x 3.1 cm and is notable for simple cysts/follicles. Moderate pelvic ascites. IMPRESSION: No IUP is visualized. This is not unexpected given the low beta HCG. By definition, in the setting of a positive pregnancy test, this reflects a pregnancy of unknown location. Differential considerations include early normal IUP, abnormal IUP/missed abortion, or nonvisualized ectopic pregnancy. Serial beta HCG is suggested. Consider repeat pelvic ultrasound in 14 days. Electronically Signed   By: Julian Hy M.D.   On: 02/19/2017 17:43   US Ob Transvaginal  Result Date: 02/19/2017 CLINICAL DATA:  Pregnant, cramping, beta HCG 13 EXAM: OBSTETRIC <14 WK [place] AND TRANSVAGINAL OB US TECHNIQUE: Both transabdominal and transvaginal ultrasound examinations were performed for complete evaluation of the gestation as well as the maternal uterus, adnexal regions, and pelvic cul-de-sac. Transvaginal technique was performed to assess early pregnancy. COMPARISON:  None. FINDINGS: Intrauterine gestational sac: None Yolk sac:  Not Visualized. Embryo:  Not Visualized. Subchorionic hemorrhage:  None visualized. Maternal uterus/adnexae: Endometrial complex measures 11 mm. 2.4 x 2.3 x 2.1 cm intramural fibroid in the anterior uterine body. Right ovary measures 3.5 x 1.7 x 3.1 cm and is notable for a 2.0 cm hemorrhagic corpus luteal cyst. Left ovary measures 3.4 x 1.9 x 3.1 cm and is notable for simple cysts/follicles. Moderate pelvic ascites. IMPRESSION: No IUP is visualized. This is not unexpected given the low beta HCG. By definition, in the setting of a positive pregnancy test, this reflects a pregnancy of unknown location. Differential considerations include early normal IUP, abnormal IUP/missed abortion, or nonvisualized ectopic pregnancy. Serial beta HCG is suggested. Consider repeat pelvic ultrasound in 14 days. Electronically Signed    By: Julian Hy M.D.   On: 02/19/2017 17:43    Procedures Procedures (including critical care time)  Medications Ordered in ED Medications - No data to display   Initial Impression / Assessment and Plan / ED Course  I have reviewed the triage vital signs and the nursing notes.  Pertinent labs & imaging results that were available during my care of the patient were reviewed by me and considered in my medical decision making (see chart for details).     Patient with potential early pregnancy or missed abortion. I also feel concurrent viral gastroenteritis as cause of  diarrhea. CBC shows nonspecific elevation of the BBC, 12.3. CMP unremarkable. Lipase 27. HCG Quant 13. UA shows the bacteria, 6-30 wbc's. Patient without urinary symptoms. Wet prep shows clue cells, moderate WBCs. GC/Chlamydia, HIV, RPR sent and pending. Pelvic ultrasound shows no IUP, suggesting serial beta hCG levels and repeat pelvic ultrasound in 14 days. We will initiate prenatal vitamins and discharged home with Zofran. Patient to follow-up with OB/GYN. Return precautions discussed. Patient understands and agrees with plan. Patient vitals stable throughout ED course and discharged in satisfactory condition.  Final Clinical Impressions(s) / ED Diagnoses   Final diagnoses:  Nausea  Diarrhea, unspecified type  Elevated serum hCG    New Prescriptions Discharge Medication List as of 02/19/2017  6:15 PM    START taking these medications   Details  metoCLOPramide (REGLAN) 10 MG tablet Take 1 tablet (10 mg total) by mouth every 6 (six) hours., Starting Thu 02/19/2017, Print    Prenatal Multivit-Min-Fe-FA (PRENATAL VITAMINS) 0.8 MG tablet Take 1 tablet by mouth daily., Starting Thu 02/19/2017, [address] [place], PA-C 02/19/17 1925    Lajean Saver, MD 02/21/17 [phone]

## 2017-02-19 NOTE — ED Notes (Signed)
Lower abd pain x 3 days , missed her period in July G 6 P4 A 1 L4, denies dysuria  Some  Diarrhea she states and some nausea

## 2017-02-19 NOTE — ED Triage Notes (Signed)
Pt endorses abd pain with n/v/d x 2 days. Pt came in 2 days ago but left due to wait time. LMP 12/20/16, and pt states "I don't know if I'm pregnant" VSS.

## 2017-02-19 NOTE — Discharge Instructions (Signed)
Medications: Prenatal vitamins, Reglan  Treatment: Begin taking prenatal vitamins once daily. Take Reglan every 6 hours as needed for nausea or vomiting. Make sure to stay well-hydrated.  Follow-up: Please follow-up with the women's outpatient clinic as soon as possible for repeat check of your hormone levels. If possible, next 48 hours. You will also need a repeat pelvic ultrasound in the next 14 days, which can be done at the [facility] outpatient clinic. Please return to the emergency department or go to the [facility] emergency department if you develop any new or worsening symptoms.

## 2017-02-19 NOTE — ED Notes (Signed)
Declined W/C at D/C and was escorted to lobby by RN. 

## 2017-02-20 ENCOUNTER — Encounter (HOSPITAL_COMMUNITY): Payer: Self-pay

## 2017-02-20 ENCOUNTER — Inpatient Hospital Stay (HOSPITAL_COMMUNITY)
Admission: AD | Admit: 2017-02-20 | Discharge: 2017-02-20 | Disposition: A | Discharge status: Home / Self Care | Payer: Medicaid Other | Source: Ambulatory Visit | Attending: Obstetrics & Gynecology | Admitting: Obstetrics & Gynecology

## 2017-02-20 DIAGNOSIS — O99331 Smoking (tobacco) complicating pregnancy, first trimester: Secondary | ICD-10-CM | POA: Insufficient documentation

## 2017-02-20 DIAGNOSIS — O3680X0 Pregnancy with inconclusive fetal viability, not applicable or unspecified: Secondary | ICD-10-CM

## 2017-02-20 DIAGNOSIS — F17210 Nicotine dependence, cigarettes, uncomplicated: Secondary | ICD-10-CM | POA: Insufficient documentation

## 2017-02-20 DIAGNOSIS — O210 Mild hyperemesis gravidarum: Secondary | ICD-10-CM | POA: Insufficient documentation

## 2017-02-20 DIAGNOSIS — Z3A01 Less than 8 weeks gestation of pregnancy: Secondary | ICD-10-CM | POA: Insufficient documentation

## 2017-02-20 DIAGNOSIS — O219 Vomiting of pregnancy, unspecified: Secondary | ICD-10-CM

## 2017-02-20 LAB — RPR: RPR Ser Ql: NONREACTIVE

## 2017-02-20 LAB — GC/CHLAMYDIA PROBE AMP (~~LOC~~) NOT AT ARMC
Chlamydia: NEGATIVE
Neisseria Gonorrhea: NEGATIVE

## 2017-02-20 LAB — HIV ANTIBODY (ROUTINE TESTING W REFLEX): HIV Screen 4th Generation wRfx: NONREACTIVE

## 2017-02-20 NOTE — Discharge Instructions (Signed)
Ectopic Pregnancy An ectopic pregnancy is when the fertilized egg attaches (implants) outside the uterus. Most ectopic pregnancies occur in one of the tubes where eggs travel from the ovary to the uterus (fallopian tubes), but the implanting can occur in other locations. In rare cases, ectopic pregnancies occur on the ovary, intestine, pelvis, abdomen, or cervix. In an ectopic pregnancy, the fertilized egg does not have the ability to develop into a normal, healthy baby. A ruptured ectopic pregnancy is one in which tearing or bursting of a fallopian tube causes internal bleeding. Often, there is intense lower abdominal pain, and vaginal bleeding sometimes occurs. Having an ectopic pregnancy can be life-threatening. If this dangerous condition is not treated, it can lead to blood loss, shock, or even death. What are the causes? The most common cause of this condition is damage to one of the fallopian tubes. A fallopian tube may be narrowed or blocked, and that keeps the fertilized egg from reaching the uterus. What increases the risk? This condition is more likely to develop in women of childbearing age who have different levels of risk. The levels of risk can be divided into three categories. High risk  You have gone through infertility treatment.  You have had an ectopic pregnancy before.  You have had surgery on the fallopian tubes, or another surgical procedure, such as an abortion.  You have had surgery to have the fallopian tubes tied (tubal ligation).  You have problems or diseases of the fallopian tubes.  You have been exposed to diethylstilbestrol (DES). This medicine was used until 1971, and it had effects on babies whose mothers took the medicine.  You become pregnant while using an IUD (intrauterine device) for birth control. Moderate risk  You have a history of infertility.  You have had an STI (sexually transmitted infection).  You have a history of pelvic inflammatory  disease (PID).  You have scarring from endometriosis.  You have multiple sexual partners.  You smoke. Low risk  You have had pelvic surgery.  You use vaginal douches.  You became sexually active before age 18. What are the signs or symptoms? Common symptoms of this condition include normal pregnancy symptoms, such as missing a period, nausea, tiredness, abdominal pain, breast tenderness, and bleeding. However, ectopic pregnancy will have additional symptoms, such as:  Pain with intercourse.  Irregular vaginal bleeding or spotting.  Cramping or pain on one side or in the lower abdomen.  Fast heartbeat, low blood pressure, and sweating.  Passing out while having a bowel movement.  Symptoms of a ruptured ectopic pregnancy and internal bleeding may include:  Sudden, severe pain in the abdomen and pelvis.  Dizziness, weakness, light-headedness, or fainting.  Pain in the shoulder or neck area.  How is this diagnosed? This condition is diagnosed by:  A pelvic exam to locate pain or a mass in the abdomen.  A pregnancy test. This blood test checks for the presence as well as the specific level of pregnancy hormone in the bloodstream.  Ultrasound. This is performed if a pregnancy test is positive. In this test, a probe is inserted into the vagina. The probe will detect a fetus, possibly in a location other than the uterus.  Taking a sample of uterus tissue (dilation and curettage, or D&C).  Surgery to perform a visual exam of the inside of the abdomen using a thin, lighted tube that has a tiny camera on the end (laparoscope).  Culdocentesis. This procedure involves inserting a needle at the top   of the vagina, behind the uterus. If blood is present in this area, it may indicate that a fallopian tube is torn.  How is this treated? This condition is treated with medicine or surgery. Medicine  An injection of a medicine (methotrexate) may be given to cause the pregnancy tissue  to be absorbed. This medicine may save your fallopian tube. It may be given if: ? The diagnosis is made early, with no signs of active bleeding. ? The fallopian tube has not ruptured. ? You are considered to be a good candidate for the medicine. Usually, pregnancy hormone blood levels are checked after methotrexate treatment. This is to be sure that the medicine is effective. It may take 4-6 weeks for the pregnancy to be absorbed. Most pregnancies will be absorbed by 3 weeks. Surgery  A laparoscope may be used to remove the pregnancy tissue.  If severe internal bleeding occurs, a larger cut (incision) may be made in the lower abdomen (laparotomy) to remove the fetus and placenta. This is done to stop the bleeding.  Part or all of the fallopian tube may be removed (salpingectomy) along with the fetus and placenta. The fallopian tube may also be repaired during the surgery.  In very rare circumstances, removal of the uterus (hysterectomy) may be required.  After surgery, pregnancy hormone testing may be done to be sure that there is no pregnancy tissue left. Whether your treatment is medicine or surgery, you may receive a Rho (D) immune globulin shot to prevent problems with any future pregnancy. This shot may be given if:  You are Rh-negative and the baby's father is Rh-positive.  You are Rh-negative and you do not know the Rh type of the baby's father.  Follow these instructions at home:  Rest and limit your activity after the procedure for as long as told by your health care provider.  Until your health care provider says that it is safe: ? Do not lift anything that is heavier than 10 lb (4.5 kg), or the limit that your health care provider tells you. ? Avoid physical exercise and any movement that requires effort (is strenuous).  To help prevent constipation: ? Eat a healthy diet that includes fruits, vegetables, and whole grains. ? Drink 6-8 glasses of water per day. Get help  right away if:  You develop worsening pain that is not relieved by medicine.  You have: ? A fever or chills. ? Vaginal bleeding. ? Redness and swelling at the incision site. ? Nausea and vomiting.  You feel dizzy or weak.  You feel light-headed or you faint. This information is not intended to replace advice given to you by your health care provider. Make sure you discuss any questions you have with your health care provider. Document Released: 08/14/2004 Document Revised: 03/05/2016 Document Reviewed: 02/06/2016 Elsevier Interactive Patient Education  2018 [employer]  

## 2017-02-20 NOTE — MAU Provider Note (Signed)
History     CSN: [id number]  Arrival date and time: 02/20/17 1548   None     Chief Complaint  Patient presents with  . Emesis   Elizabeth Dean is a 36 y.o. G2P0 at [redacted]w[redacted]d who presents today with nausea/vomiting/diarrhea. She was seen in the ED for this yesterday, and her roommates boyfriend has also had GI symptoms. She states that the diarrhea has improved, but she is still having nausea and vomiting. She has not filled the rxs that were given to her yesterday. HCG was 13 yesterday, and no IUP seen on [place]. She states that she was told to FU here in 48 hours, and she is here now for FU (24 hours later).    Emesis   This is a new problem. The current episode started yesterday. The problem has been gradually improving. The emesis has an appearance of stomach contents. There has been no fever. Risk factors include ill contacts (and pregnancy ). She has tried nothing (has rx for reglan that she has not filled as of today ) for the symptoms. The treatment provided no relief.   Past Medical History:  Diagnosis Date  . Anxiety   . Asthma   . Bronchitis   . Depression   . Panic attacks     Past Surgical History:  Procedure Laterality Date  . CHOLECYSTECTOMY    . TUBAL LIGATION      Family History  Problem Relation Age of Onset  . Heart disease Maternal Grandmother   . Hypertension Maternal Grandmother     Social History  Substance Use Topics  . Smoking status: Current Some Day Smoker    Packs/day: 1.00    Types: Cigarettes  . Smokeless tobacco: Never Used  . Alcohol use Yes     Comment: socially    Allergies:  Allergies  Allergen Reactions  . Hydrocodone Other (See Comments)    Flares gallbladder  . Codeine Hives    Prescriptions Prior to Admission  Medication Sig Dispense Refill Last Dose  . albuterol (PROVENTIL HFA;VENTOLIN HFA) 108 (90 BASE) MCG/ACT inhaler Inhale 2 puffs into the lungs every 4 (four) hours as needed for wheezing or shortness of breath. (Patient  not taking: Reported on 05/15/2016) 1 Inhaler 1 Not Taking at Unknown time  . hydrOXYzine (ATARAX/VISTARIL) 25 MG tablet Take 1 tablet (25 mg total) by mouth 3 (three) times daily as needed for anxiety. (Patient not taking: Reported on 05/15/2016) 60 tablet 3 Not Taking at Unknown time  . ibuprofen (ADVIL,MOTRIN) 800 MG tablet Take 1 tablet (800 mg total) by mouth 3 (three) times daily. 21 tablet 0   . methocarbamol (ROBAXIN) 500 MG tablet Take 2 tablets (1,000 mg total) by mouth 2 (two) times daily. 20 tablet 0   . metoCLOPramide (REGLAN) 10 MG tablet Take 1 tablet (10 mg total) by mouth every 6 (six) hours. 20 tablet 0   . oxyCODONE-acetaminophen (PERCOCET/ROXICET) 5-325 MG tablet Take 1-2 tablets by mouth every 6 (six) hours as needed for severe pain. 15 tablet 0   . PARoxetine (PAXIL) 20 MG tablet Take 1 tablet (20 mg total) by mouth daily. (Patient not taking: Reported on 05/15/2016) 30 tablet 3 Not Taking at Unknown time  . Prenatal Multivit-Min-Fe-FA (PRENATAL VITAMINS) 0.8 MG tablet Take 1 tablet by mouth daily. 30 tablet 0     Review of Systems  Gastrointestinal: Positive for vomiting.   Physical Exam   Blood pressure 125/83, pulse 97, temperature 98.2 F (36.8 C), resp.  rate 16, last menstrual period 01/11/2017, unknown if currently breastfeeding.  Physical Exam  Nursing note and vitals reviewed. Constitutional: She is oriented to person, place, and time. She appears well-developed and well-nourished. No distress.  HENT:  Head: Normocephalic.  Cardiovascular: Normal rate.   Respiratory: Effort normal.  GI: Soft. There is no tenderness. There is no rebound.  Neurological: She is alert and oriented to person, place, and time.  Skin: Skin is warm and dry.  Psychiatric: She has a normal mood and affect.     MAU Course  Procedures  MDM D/W patient that 24 hours is too soon for repeat hcg and we need to return tomorrow for bloodwork. She verbalizes understanding.    Assessment and Plan   1. Nausea/vomiting in pregnancy   2. Pregnancy, location unknown    DC home Comfort measures reviewed  1st Trimester precautions  Bleeding precautions Ectopic precautions RX: none  Return to MAU as needed FU with OB as planned  Christian Follow up.   Why:  Come to MAU tomorrow around 3:00 PM for repeat pregnancy hormone level  Contact information: [address] [id number] [place] 02/20/2017, 4:03 PM

## 2017-02-21 ENCOUNTER — Inpatient Hospital Stay (HOSPITAL_COMMUNITY)
Admission: AD | Admit: 2017-02-21 | Discharge: 2017-02-21 | Disposition: A | Discharge status: Home / Self Care | Payer: Medicaid Other | Source: Ambulatory Visit | Attending: Obstetrics and Gynecology | Admitting: Obstetrics and Gynecology

## 2017-02-21 DIAGNOSIS — O200 Threatened abortion: Secondary | ICD-10-CM

## 2017-02-21 DIAGNOSIS — O280 Abnormal hematological finding on antenatal screening of mother: Secondary | ICD-10-CM

## 2017-02-21 DIAGNOSIS — Z3A01 Less than 8 weeks gestation of pregnancy: Secondary | ICD-10-CM | POA: Insufficient documentation

## 2017-02-21 DIAGNOSIS — O99331 Smoking (tobacco) complicating pregnancy, first trimester: Secondary | ICD-10-CM | POA: Insufficient documentation

## 2017-02-21 DIAGNOSIS — F17210 Nicotine dependence, cigarettes, uncomplicated: Secondary | ICD-10-CM | POA: Insufficient documentation

## 2017-02-21 DIAGNOSIS — O26899 Other specified pregnancy related conditions, unspecified trimester: Secondary | ICD-10-CM

## 2017-02-21 LAB — HCG, QUANTITATIVE, PREGNANCY: hCG, Beta Chain, Quant, S: 8 m[IU]/mL — ABNORMAL HIGH (ref <5)

## 2017-02-21 NOTE — MAU Provider Note (Signed)
History    CSN: [id number]  Arrival date and time: 02/21/17 1417   First Provider Initiated Contact with Patient 02/21/17 1537     Chief Complaint  Patient presents with  . Follow-up   HPI Elizabeth Dean is a 36 y.o. G2P0 at [redacted]w[redacted]d who presents for a repeat bHCG. She denies any vaginal bleeding or abdominal pain. She was seen in the ED on 8/1 with a HCG of 13 and no IUP seen on u/s.   OB History    Gravida Para Term Preterm AB Living   2             SAB TAB Ectopic Multiple Live Births                  Past Medical History:  Diagnosis Date  . Anxiety   . Asthma   . Bronchitis   . Depression   . Panic attacks     Past Surgical History:  Procedure Laterality Date  . CHOLECYSTECTOMY    . TUBAL LIGATION      Family History  Problem Relation Age of Onset  . Heart disease Maternal Grandmother   . Hypertension Maternal Grandmother     Social History  Substance Use Topics  . Smoking status: Current Some Day Smoker    Packs/day: 1.00    Types: Cigarettes  . Smokeless tobacco: Never Used  . Alcohol use Yes     Comment: socially    Allergies:  Allergies  Allergen Reactions  . Hydrocodone Other (See Comments)    Flares gallbladder  . Codeine Hives    No prescriptions prior to admission.    Review of Systems  Constitutional: Negative.  Negative for chills and fever.  HENT: Negative.   Respiratory: Negative.  Negative for shortness of breath.   Cardiovascular: Negative.  Negative for chest pain.  Gastrointestinal: Negative.  Negative for abdominal pain, constipation, diarrhea, nausea and vomiting.  Genitourinary: Negative.  Negative for dysuria, vaginal bleeding and vaginal discharge.  Neurological: Negative.  Negative for dizziness and headaches.  Psychiatric/Behavioral: Negative.    Physical Exam   Blood pressure 126/83, pulse 96, temperature 98.2 F (36.8 C), resp. rate 16, last menstrual period 01/11/2017, unknown if currently  breastfeeding.  Physical Exam  Nursing note and vitals reviewed. Constitutional: She is oriented to person, place, and time. She appears well-developed and well-nourished.  HENT:  Head: Normocephalic and atraumatic.  Eyes: Conjunctivae are normal. No scleral icterus.  Cardiovascular: Normal rate, regular rhythm and normal heart sounds.   Respiratory: Effort normal and breath sounds normal. No respiratory distress.  GI: Soft. She exhibits no distension. There is no tenderness. There is no guarding.  Neurological: She is alert and oriented to person, place, and time.  Skin: Skin is warm and dry.  Psychiatric: She has a normal mood and affect. Her behavior is normal. Judgment and thought content normal.    MAU Course  Procedures Results for orders placed or performed during the hospital encounter of 02/21/17 (from the past 24 hour(s))  hCG, quantitative, pregnancy     Status: Abnormal   Collection Time: 02/21/17  2:44 PM  Result Value Ref Range   hCG, Beta Chain, Quant, S 8 (H) <5 mIU/mL   Results for KATRECE, ROEDIGER (MRN [id number]) as of 02/21/2017 15:46  Ref. Range 02/19/2017 15:10 02/21/2017 14:44  HCG, Beta Chain, Quant, S Latest Ref Range: <5 mIU/mL 13 (H) 8 (H)   MDM HCG Decrease in HCG with nothing seen on  u/s concerning for miscarriage Assessment and Plan   1. Threatened abortion in first trimester   2. Low maternal serum human chorionic gonadotropin (hCG)    -Discharge patient home in stable condition -Follow up in [facility] clinic in 1 week (8/13) for repeat bHCG -Bleeding and pain precautions reviewed with patient -Encouraged to return here or to other Urgent Care/ED if she develops worsening of symptoms, increase in pain, fever, or other concerning symptoms.   Len Blalock SNM 02/21/2017, 3:45 PM   Likely SAB, recommend repeat HCG in a week I confirm that I have verified the information documented in the SNM's note and that I have also personally reperformed the physical  exam and all medical decision making activities.  Seabron Spates, CNM

## 2017-02-21 NOTE — MAU Note (Signed)
Denies pain or bleeding.  Here for HCG, needs a pregnancy confirmation letter.

## 2017-02-21 NOTE — Discharge Instructions (Signed)

## 2017-03-02 ENCOUNTER — Telehealth: Payer: Self-pay

## 2017-03-02 ENCOUNTER — Other Ambulatory Visit: Payer: Self-pay

## 2017-03-02 DIAGNOSIS — O3680X0 Pregnancy with inconclusive fetal viability, not applicable or unspecified: Secondary | ICD-10-CM

## 2017-03-02 LAB — HCG, QUANTITATIVE, PREGNANCY: HCG, BETA CHAIN, QUANT, S: 49 m[IU]/mL — AB (ref <5)

## 2017-03-02 NOTE — Telephone Encounter (Signed)
-----   Message from Katheren Shams, DO sent at 03/02/2017  3:19 PM EDT ----- Reviewed results. HCG not trending like normal. Will repeat US STAT; Order placed and RN to schedule. Patient without any bleeding or abdominal pain. First trimester/ectopic precautions discussed by RN.  Patient will return for follow-up US in 1 week. Patient may return to MAU as needed or if her condition were to change or worsen.

## 2017-03-02 NOTE — Telephone Encounter (Signed)
Called patient x 2 no answer or voice mail to leave a message.

## 2017-03-03 ENCOUNTER — Ambulatory Visit (HOSPITAL_COMMUNITY): Admission: RE | Admit: 2017-03-03 | Payer: Self-pay | Source: Ambulatory Visit

## 2017-03-03 NOTE — Telephone Encounter (Signed)
Patient voice understanding at this time.

## 2017-03-03 NOTE — Telephone Encounter (Signed)
Received message from ultrasound that patient would not be able to make her appointment today. Ultrasound rescheduled her to 03/09/2017 due to patient not having transportation. I have explained the importance of patient needing an ultrasound sooner but patient stated she will not be able to come until the 03/09/2017 due to transportation.. I have given patient ectopic precaution and what to watch for and if she should have any pain or bleeding she needs to go directly to the ER or MAU to be seen. Patient

## 2017-03-09 ENCOUNTER — Ambulatory Visit: Payer: Self-pay | Admitting: Family Medicine

## 2017-03-09 ENCOUNTER — Telehealth: Payer: Self-pay

## 2017-03-09 ENCOUNTER — Ambulatory Visit (INDEPENDENT_AMBULATORY_CARE_PROVIDER_SITE_OTHER): Payer: Self-pay | Admitting: Obstetrics and Gynecology

## 2017-03-09 ENCOUNTER — Ambulatory Visit (HOSPITAL_COMMUNITY)
Admission: RE | Admit: 2017-03-09 | Discharge: 2017-03-09 | Disposition: A | Discharge status: Home / Self Care | Payer: Self-pay | Source: Ambulatory Visit | Attending: Obstetrics and Gynecology | Admitting: Obstetrics and Gynecology

## 2017-03-09 VITALS — BP 143/91 | HR 104

## 2017-03-09 DIAGNOSIS — Z3200 Encounter for pregnancy test, result unknown: Secondary | ICD-10-CM

## 2017-03-09 DIAGNOSIS — O3680X0 Pregnancy with inconclusive fetal viability, not applicable or unspecified: Secondary | ICD-10-CM

## 2017-03-09 DIAGNOSIS — O26899 Other specified pregnancy related conditions, unspecified trimester: Secondary | ICD-10-CM | POA: Insufficient documentation

## 2017-03-09 DIAGNOSIS — IMO0002 Reserved for concepts with insufficient information to code with codable children: Secondary | ICD-10-CM

## 2017-03-09 DIAGNOSIS — O3410 Maternal care for benign tumor of corpus uteri, unspecified trimester: Secondary | ICD-10-CM | POA: Insufficient documentation

## 2017-03-09 DIAGNOSIS — O0281 Inappropriate change in quantitative human chorionic gonadotropin (hCG) in early pregnancy: Secondary | ICD-10-CM

## 2017-03-09 DIAGNOSIS — R799 Abnormal finding of blood chemistry, unspecified: Secondary | ICD-10-CM

## 2017-03-09 DIAGNOSIS — R1900 Intra-abdominal and pelvic swelling, mass and lump, unspecified site: Secondary | ICD-10-CM | POA: Insufficient documentation

## 2017-03-09 DIAGNOSIS — Z3A00 Weeks of gestation of pregnancy not specified: Secondary | ICD-10-CM | POA: Insufficient documentation

## 2017-03-09 LAB — HCG, QUANTITATIVE, PREGNANCY: HCG, BETA CHAIN, QUANT, S: 18 m[IU]/mL — AB (ref <5)

## 2017-03-09 NOTE — Progress Notes (Signed)
Patient here for ultrasound result. She was originally seen in the on 8/2 for N/V, had a b-HCG of 13 and U/S did not show IUP. Seen again in MAU on 8/4, at which time b-HCG was 8. Repeat b-HCG in 1-week on 03/02/17 was 49. STAT u/S was ordered, but patient could not make to appt. Repeat U/S done today, results:  ---------------------------------------------------------------------------------------- IMPRESSION: No IUP is visualized. This is not unexpected given the low beta HCG.  By definition, in the setting of a positive pregnancy test, this reflects a pregnancy of unknown location. Differential considerations include early normal IUP, abnormal IUP/missed abortion, or nonvisualized ectopic pregnancy.  Serial beta HCG is suggested. Consider repeat pelvic ultrasound in 14 days. ----------------------------------------------------------------------------------------  Patient denies any abdominal pain, cramping or vaginal bleeding. She reports LMP 01/11/17. Reports she had some spotting since, last was 2 weeks ago.   PLAN: --STAT beta-HCG ordered --Discussed with Dr. Ilda Basset. Pt will wait for results and will be seen this afternoon for further management.  Future Appointments Date Time Provider [place]  03/09/2017 1:40 PM Aletha Halim, MD [place]

## 2017-03-09 NOTE — Progress Notes (Signed)
Obstetrics and Gynecology Visit Return Patient Evaluation  Appointment Date: 03/09/2017  Primary Care Provider: Tresa Garter  Referring Provider: Tresa Garter, MD  Chief Complaint: f/u beta  History of Present Illness:  Elizabeth Dean is a 36 y.o. here for f/u stat beta. She had a f/u u/s RN visit today with the below results so a stat beta was added on and regular visit. No pain, VB, spotting  8/2: 13 8/4: 8 8/13: 49 8/20: 18  Review of Systems:  as noted in the History of Present Illness.  Medications: none Allergies: is allergic to hydrocodone and codeine.  Physical Exam:  BP (!) 143/91   Pulse (!) 104   LMP 01/11/2017  There is no height or weight on file to calculate BMI. General appearance: Well nourished, well developed female in no acute distress.    Assessment: pt stable  Plan: D/w her that given the degree of drop that I recommend repeat beta in one week and continuing to fall till it's zero. If beta goes up or not a significant drop next week, recommend consideration for MTx treatment. ER precautions given.  Pt amenable to plan   RTC: 1wk for stat beta and f/u visit.   Durene Romans MD Attending Center for [employer])

## 2017-03-10 ENCOUNTER — Other Ambulatory Visit: Payer: Self-pay | Admitting: Internal Medicine

## 2017-03-10 DIAGNOSIS — F4323 Adjustment disorder with mixed anxiety and depressed mood: Secondary | ICD-10-CM

## 2017-03-10 NOTE — Telephone Encounter (Signed)
error 

## 2017-03-16 ENCOUNTER — Other Ambulatory Visit: Payer: Self-pay | Admitting: General Practice

## 2017-03-16 DIAGNOSIS — O3680X0 Pregnancy with inconclusive fetal viability, not applicable or unspecified: Secondary | ICD-10-CM

## 2017-03-16 LAB — HCG, QUANTITATIVE, PREGNANCY: hCG, Beta Chain, Quant, S: 11 m[IU]/mL — ABNORMAL HIGH (ref <5)

## 2017-03-16 NOTE — Progress Notes (Signed)
Patient here for repeat stat. Denies pain or bleeding. Spoke with Dr Ilda Basset regarding lab results who recommends follow up bhcg next week, not stat. Informed patient of results and recommendation. Patient verbalized understanding to all & had no questions

## 2017-03-17 ENCOUNTER — Emergency Department (HOSPITAL_COMMUNITY): Payer: Self-pay

## 2017-03-17 ENCOUNTER — Encounter (HOSPITAL_COMMUNITY): Payer: Self-pay | Admitting: Emergency Medicine

## 2017-03-17 DIAGNOSIS — R0602 Shortness of breath: Secondary | ICD-10-CM | POA: Insufficient documentation

## 2017-03-17 DIAGNOSIS — Z5321 Procedure and treatment not carried out due to patient leaving prior to being seen by health care provider: Secondary | ICD-10-CM | POA: Insufficient documentation

## 2017-03-17 NOTE — ED Triage Notes (Signed)
Pt presents with increased SOB with exertion; pt reports hx of bronchitis; lung sounds in triage is clear bilaterally; pt does not appear to be in any resp distress; speaking in full sentences; pt reports 2.5 months pregnant

## 2017-03-17 NOTE — ED Notes (Signed)
Writer called for vitals reassessment, no response

## 2017-03-18 ENCOUNTER — Emergency Department (HOSPITAL_COMMUNITY)
Admission: EM | Admit: 2017-03-18 | Discharge: 2017-03-18 | Discharge status: Against Medical Advice | Payer: Self-pay | Attending: Emergency Medicine | Admitting: Emergency Medicine

## 2017-03-18 NOTE — ED Notes (Signed)
Called for room assignment and recheck vitals, no response,

## 2017-03-24 ENCOUNTER — Other Ambulatory Visit: Payer: Self-pay

## 2017-03-24 DIAGNOSIS — O039 Complete or unspecified spontaneous abortion without complication: Secondary | ICD-10-CM

## 2017-03-25 ENCOUNTER — Encounter (HOSPITAL_COMMUNITY): Payer: Self-pay | Admitting: *Deleted

## 2017-03-25 ENCOUNTER — Emergency Department (HOSPITAL_COMMUNITY)
Admission: EM | Admit: 2017-03-25 | Discharge: 2017-03-25 | Disposition: A | Discharge status: Home / Self Care | Payer: Self-pay | Attending: Emergency Medicine | Admitting: Emergency Medicine

## 2017-03-25 ENCOUNTER — Emergency Department (HOSPITAL_COMMUNITY): Payer: Self-pay

## 2017-03-25 DIAGNOSIS — Z79899 Other long term (current) drug therapy: Secondary | ICD-10-CM | POA: Insufficient documentation

## 2017-03-25 DIAGNOSIS — L03211 Cellulitis of face: Secondary | ICD-10-CM | POA: Insufficient documentation

## 2017-03-25 DIAGNOSIS — J45909 Unspecified asthma, uncomplicated: Secondary | ICD-10-CM | POA: Insufficient documentation

## 2017-03-25 DIAGNOSIS — F17210 Nicotine dependence, cigarettes, uncomplicated: Secondary | ICD-10-CM | POA: Insufficient documentation

## 2017-03-25 LAB — BETA HCG QUANT (REF LAB): hCG Quant: <1 m[IU]/mL

## 2017-03-25 MED ORDER — CEPHALEXIN 500 MG PO CAPS: 500.0000 mg | ORAL_CAPSULE | Freq: Four times a day (QID) | ORAL | 0 refills | Status: DC

## 2017-03-25 MED ORDER — NAPROXEN 250 MG PO TABS
500.0000 mg | ORAL_TABLET | Freq: Once | ORAL | Status: AC
  Administered 2017-03-25: 500 mg via ORAL
  Filled 2017-03-25: qty 2

## 2017-03-25 MED ORDER — TRAMADOL HCL 50 MG PO TABS: 50.0000 mg | ORAL_TABLET | Freq: Four times a day (QID) | ORAL | 0 refills | Status: DC | PRN

## 2017-03-25 NOTE — ED Triage Notes (Signed)
Pt reports facial swelling with recent pregnancy that resulted in a miscarriage, pt now c/o L facial swelling & pain & redness to L facial piercing, c/o green drainage from the area, A&O x4

## 2017-03-25 NOTE — ED Notes (Signed)
Patient transported to X-ray 

## 2017-03-25 NOTE — ED Provider Notes (Signed)
[facility] DEPT Provider Note   CSN: [id number] Arrival date & time: 03/25/17  6734     History   Chief Complaint Chief Complaint  Patient presents with  . Wound Infection    HPI Elizabeth Dean is a 36 y.o. female who presents with an infected facial piercing. The piercing is in the left cheek. She's had the piercing for over a year. Over the past 4 days she has had worsening redness and pain to the area. It has been draining yellowish-green fluid intermittently. She has been using warm compresses over the area with no relief. This morning she woke up and the left side of her face was swollen which prompted her to come to the ED. No fevers  HPI  Past Medical History:  Diagnosis Date  . Anxiety   . Asthma   . Bronchitis   . Depression   . Panic attacks     Patient Active Problem List   Diagnosis Date Noted  . Pregnancy of unknown anatomic location 09/03/2015  . Healthcare maintenance 09/03/2015  . Hypertriglyceridemia 04/04/2014  . Asthma with acute exacerbation 04/04/2014  . Need for prophylactic vaccination and inoculation against influenza 04/04/2014  . Adjustment disorder with mixed anxiety and depressed mood 02/14/2014  . Muscle spasm 01/02/2014  . Major depression 01/02/2014  . Multiple falls 01/02/2014    Past Surgical History:  Procedure Laterality Date  . CHOLECYSTECTOMY    . TUBAL LIGATION      OB History    Gravida Para Term Preterm AB Living   2             SAB TAB Ectopic Multiple Live Births                   Home Medications    Prior to Admission medications   Medication Sig Start Date End Date Taking? Authorizing Provider  albuterol (PROVENTIL HFA;VENTOLIN HFA) 108 (90 BASE) MCG/ACT inhaler Inhale 2 puffs into the lungs every 4 (four) hours as needed for wheezing or shortness of breath. Patient not taking: Reported on 05/15/2016 04/04/14   Tresa Garter, MD  hydrOXYzine (ATARAX/VISTARIL) 25 MG tablet Take 1 tablet (25 mg total)  by mouth 3 (three) times daily as needed for anxiety. Patient not taking: Reported on 05/15/2016 11/01/15   Tresa Garter, MD  methocarbamol (ROBAXIN) 500 MG tablet Take 2 tablets (1,000 mg total) by mouth 2 (two) times daily. Patient not taking: Reported on 03/09/2017 05/15/16   Frederica Kuster, PA-C  metoCLOPramide (REGLAN) 10 MG tablet Take 1 tablet (10 mg total) by mouth every 6 (six) hours. Patient not taking: Reported on 03/09/2017 02/19/17   Frederica Kuster, PA-C  oxyCODONE-acetaminophen (PERCOCET/ROXICET) 5-325 MG tablet Take 1-2 tablets by mouth every 6 (six) hours as needed for severe pain. Patient not taking: Reported on 03/09/2017 05/15/16   Frederica Kuster, PA-C  PARoxetine (PAXIL) 20 MG tablet Take 1 tablet (20 mg total) by mouth daily. Patient not taking: Reported on 05/15/2016 11/01/15   Tresa Garter, MD  Prenatal Multivit-Min-Fe-FA (PRENATAL VITAMINS) 0.8 MG tablet Take 1 tablet by mouth daily. 02/19/17   Frederica Kuster, PA-C    Family History Family History  Problem Relation Age of Onset  . Heart disease Maternal Grandmother   . Hypertension Maternal Grandmother     Social History Social History  Substance Use Topics  . Smoking status: Current Some Day Smoker    Packs/day: 1.00    Types: Cigarettes  .  Smokeless tobacco: Never Used  . Alcohol use Yes     Comment: socially     Allergies   Hydrocodone and Codeine   Review of Systems Review of Systems  Constitutional: Negative for fever.  Skin: Positive for wound.     Physical Exam Updated Vital Signs BP 125/89 (BP Location: Left Arm)   Pulse 86   Temp 98 F (36.7 C) (Oral)   Resp 14   Ht 4\' 9"  (1.448 m)   Wt 71.2 kg (157 lb)   LMP 01/11/2017   SpO2 99%   Breastfeeding? No   BMI 33.97 kg/m   Physical Exam  Constitutional: She is oriented to person, place, and time. She appears well-developed and well-nourished. No distress.  HENT:  Head: Normocephalic and atraumatic.  Stainless  steel piercing with ball end in left cheek. Surrounding skin externally is red, indurated, swollen about 1cm circumferentially. No active drainage. Internally the piercing is not able to be visualized and imbedded in the mucosa.   Eyes: Pupils are equal, round, and reactive to light. Conjunctivae are normal. Right eye exhibits no discharge. Left eye exhibits no discharge. No scleral icterus.  Neck: Normal range of motion.  Cardiovascular: Normal rate.   Pulmonary/Chest: Effort normal. No respiratory distress.  Abdominal: She exhibits no distension.  Neurological: She is alert and oriented to person, place, and time.  Skin: Skin is warm and dry.  Psychiatric: She has a normal mood and affect. Her behavior is normal.  Nursing note and vitals reviewed.    ED Treatments / Results  Labs (all labs ordered are listed, but only abnormal results are displayed) Labs Reviewed - No data to display  EKG  EKG Interpretation None       Radiology No results found.  Procedures Procedures (including critical care time)  Medications Ordered in ED Medications  naproxen (NAPROSYN) tablet 500 mg (500 mg Oral Given 03/25/17 1010)     Initial Impression / Assessment and Plan / ED Course  I have reviewed the triage vital signs and the nursing notes.  Pertinent labs & imaging results that were available during my care of the patient were reviewed by me and considered in my medical decision making (see chart for details).  36 year old female with infected piercing in the left cheek. Discussed with Dr. Lacinda Axon. Will obtain Xray to get a better idea of how far in the piercing is in the cheek since we cannot visualize internally.   Xray shows piercing is intact on both ends. Lidocaine was injected to the area by [employer] and piercing was removed. Please see his procedure note for details. Pt will be discharged with Keflex and advised to return for worsening symptoms.  Final Clinical Impressions(s) /  ED Diagnoses   Final diagnoses:  Cellulitis of face    New Prescriptions New Prescriptions   No medications on file     Iris Pert 03/25/17 [place], MD 03/28/17 [phone]

## 2017-03-25 NOTE — Discharge Instructions (Signed)
Take antibiotic as directed Continue warm compresses over the area

## 2017-03-25 NOTE — ED Provider Notes (Signed)
Patient care performed by Janetta Hora PA-C; please see her note for full H&P.  In short patient here with infected facial piercing.  Patient verbalized understanding of procedure performed, risks and benefits including scarring, infection, nerve damage, and incomplete removal.  She like to proceed with removal.   Patient was prepped in sterile fashion using Betadine and alcohol prep.  Left-sided cheek piercing with surrounding induration and purulent discharge.  Anesthetized using 2 mL 2% lidocaine- fully anesthetized.  Using gentle traction metal foreign body was removed.  Fascial wound was deloculated and cleansed.  Patient tolerated procedure without complication.    Okey Regal, PA-C 03/25/17 [place]    Nat Christen, MD 03/28/17 [phone]

## 2017-03-27 ENCOUNTER — Telehealth: Payer: Self-pay | Admitting: Lab

## 2017-03-27 NOTE — Telephone Encounter (Signed)
Attempted to call patient regarding her results. No answer, so I left a message for patient to call office. Will attempt to call again

## 2017-03-27 NOTE — Telephone Encounter (Signed)
-----   Message from Aletha Halim, MD sent at 03/25/2017  8:59 AM EDT ----- Elizabeth Dean is now zero. Can you let her know and to expect a period sometime in the next 6wks and if not to call our clinic? Thanks Also, please close out her pregnancy episode. Thanks!

## 2017-03-31 NOTE — Telephone Encounter (Signed)
Called patient, no answer- left message to call us back with results. Will send letter

## 2017-07-20 ENCOUNTER — Inpatient Hospital Stay (HOSPITAL_COMMUNITY)
Admission: AD | Admit: 2017-07-20 | Discharge: 2017-07-20 | Disposition: A | Discharge status: Home / Self Care | Payer: Self-pay | Source: Ambulatory Visit | Attending: Obstetrics & Gynecology | Admitting: Obstetrics & Gynecology

## 2017-07-20 ENCOUNTER — Encounter (HOSPITAL_COMMUNITY): Payer: Self-pay | Admitting: *Deleted

## 2017-07-20 DIAGNOSIS — R110 Nausea: Secondary | ICD-10-CM | POA: Insufficient documentation

## 2017-07-20 DIAGNOSIS — F419 Anxiety disorder, unspecified: Secondary | ICD-10-CM | POA: Insufficient documentation

## 2017-07-20 DIAGNOSIS — F329 Major depressive disorder, single episode, unspecified: Secondary | ICD-10-CM | POA: Insufficient documentation

## 2017-07-20 DIAGNOSIS — Z789 Other specified health status: Secondary | ICD-10-CM

## 2017-07-20 DIAGNOSIS — Z3202 Encounter for pregnancy test, result negative: Secondary | ICD-10-CM | POA: Insufficient documentation

## 2017-07-20 DIAGNOSIS — K219 Gastro-esophageal reflux disease without esophagitis: Secondary | ICD-10-CM | POA: Insufficient documentation

## 2017-07-20 DIAGNOSIS — F17210 Nicotine dependence, cigarettes, uncomplicated: Secondary | ICD-10-CM | POA: Insufficient documentation

## 2017-07-20 DIAGNOSIS — J45909 Unspecified asthma, uncomplicated: Secondary | ICD-10-CM | POA: Insufficient documentation

## 2017-07-20 DIAGNOSIS — M549 Dorsalgia, unspecified: Secondary | ICD-10-CM | POA: Insufficient documentation

## 2017-07-20 HISTORY — DX: Gastro-esophageal reflux disease without esophagitis: K21.9

## 2017-07-20 HISTORY — DX: Anemia, unspecified: D64.9

## 2017-07-20 LAB — URINALYSIS, ROUTINE W REFLEX MICROSCOPIC
Bilirubin Urine: NEGATIVE
GLUCOSE, UA: NEGATIVE mg/dL
HGB URINE DIPSTICK: NEGATIVE
Ketones, ur: NEGATIVE mg/dL
NITRITE: NEGATIVE
PH: 5 (ref 5.0–8.0)
PROTEIN: NEGATIVE mg/dL
SPECIFIC GRAVITY, URINE: 1.021 (ref 1.005–1.030)

## 2017-07-20 LAB — POCT PREGNANCY, URINE: Preg Test, Ur: NEGATIVE

## 2017-07-20 MED ORDER — PRENATAL VITAMINS 0.8 MG PO TABS: 1.0000 | ORAL_TABLET | Freq: Every day | ORAL | 12 refills | Status: DC

## 2017-07-20 MED ORDER — IBUPROFEN 600 MG PO TABS: 600.0000 mg | ORAL_TABLET | Freq: Four times a day (QID) | ORAL | 1 refills | Status: DC | PRN

## 2017-07-20 NOTE — MAU Provider Note (Signed)
Chief Complaint: Headache; Back Pain; and Dizziness   First Provider Initiated Contact with Patient 07/20/17 1915     SUBJECTIVE HPI: Elizabeth Dean is a 36 y.o. [id number] female who presents to Maternity Admissions reporting suspicion for pregnancy due to low back pain, foot pain, HA, nausea. Doesn't think she is late for her Period. Patient's last menstrual period was 06/04/2017 (approximate). Hasn't taken UPT. Pt is TTC.   Modifying factors: Nothing. None. Hasn't tried anything for the pain.  Associated signs and symptoms: Neg for fever, chills, vomiting, diarrhea, flank pain, urinary complaints.   Past Medical History:  Diagnosis Date  . Anemia   . Anxiety   . Asthma   . Bronchitis   . Depression   . GERD (gastroesophageal reflux disease)   . Panic attacks    OB History  Gravida Para Term Preterm AB Living  6 4 4   2 4   SAB TAB Ectopic Multiple Live Births  2       4    # Outcome Date GA Lbr Len/2nd Weight Sex Delivery Anes PTL Lv  6 Term      Vag-Spont   LIV  5 Term      Vag-Spont   LIV  4 Term      Vag-Spont   LIV  3 Term      Vag-Spont   LIV  2 SAB           1 SAB              Past Surgical History:  Procedure Laterality Date  . CHOLECYSTECTOMY     Social History   Socioeconomic History  . Marital status: Single    Spouse name: Not on file  . Number of children: Not on file  . Years of education: Not on file  . Highest education level: Not on file  Social Needs  . Financial resource strain: Not on file  . Food insecurity - worry: Not on file  . Food insecurity - inability: Not on file  . Transportation needs - medical: Not on file  . Transportation needs - non-medical: Not on file  Occupational History  . Not on file  Tobacco Use  . Smoking status: Current Some Day Smoker    Packs/day: 1.00    Types: Cigarettes  . Smokeless tobacco: Never Used  Substance and Sexual Activity  . Alcohol use: Yes    Comment: socially  . Drug use: No  . Sexual  activity: Yes    Birth control/protection: None  Other Topics Concern  . Not on file  Social History Narrative  . Not on file   Family History  Problem Relation Age of Onset  . Heart disease Maternal Grandmother   . Hypertension Maternal Grandmother    No current facility-administered medications on file prior to encounter.    Current Outpatient Medications on File Prior to Encounter  Medication Sig Dispense Refill  . cephALEXin (KEFLEX) 500 MG capsule Take 1 capsule (500 mg total) by mouth 4 (four) times daily. 20 capsule 0  . traMADol (ULTRAM) 50 MG tablet Take 1 tablet (50 mg total) by mouth every 6 (six) hours as needed. 15 tablet 0   Allergies  Allergen Reactions  . Hydrocodone Other (See Comments)    Flares gallbladder  . Codeine Hives    I have reviewed patient's Past Medical Hx, Surgical Hx, Family Hx, Social Hx, medications and allergies.   Review of Systems  Constitutional: Negative for appetite change,  chills and fever.  Gastrointestinal: Positive for nausea. Negative for abdominal pain, constipation, diarrhea and vomiting.  Genitourinary: Negative for dysuria, flank pain and vaginal bleeding.  Musculoskeletal: Positive for back pain. Negative for neck pain.  Neurological: Negative for headaches.    OBJECTIVE Patient Vitals for the past 24 hrs:  BP Temp Temp src Pulse Resp Height Weight  07/20/17 1848 (!) 141/94 - - (!) 107 - - -  07/20/17 1639 (!) 143/97 98.2 F (36.8 C) Oral 100 18 4\' 9"  (1.448 m) 153 lb (69.4 kg)   Constitutional: Well-developed, well-nourished female in no acute distress.  Cardiovascular: Mild tachycardia Respiratory: normal rate and effort.  GI: Abd soft, non-tender. MS: Extremities nontender, no edema, normal ROM Neurologic: Alert and oriented x 4.  GU: Neg CVAT.  LAB RESULTS Results for orders placed or performed during the hospital encounter of 07/20/17 (from the past 24 hour(s))  Urinalysis, Routine w reflex microscopic      Status: Abnormal   Collection Time: 07/20/17  4:40 PM  Result Value Ref Range   Color, Urine YELLOW YELLOW   APPearance HAZY (A) CLEAR   Specific Gravity, Urine 1.021 1.005 - 1.030   pH 5.0 5.0 - 8.0   Glucose, UA NEGATIVE NEGATIVE mg/dL   Hgb urine dipstick NEGATIVE NEGATIVE   Bilirubin Urine NEGATIVE NEGATIVE   Ketones, ur NEGATIVE NEGATIVE mg/dL   Protein, ur NEGATIVE NEGATIVE mg/dL   Nitrite NEGATIVE NEGATIVE   Leukocytes, UA TRACE (A) NEGATIVE   RBC / HPF 0-5 0 - 5 RBC/hpf   WBC, UA 6-30 0 - 5 WBC/hpf   Bacteria, UA RARE (A) NONE SEEN   Squamous Epithelial / LPF 0-5 (A) NONE SEEN   Mucus PRESENT   Pregnancy, urine POC     Status: None   Collection Time: 07/20/17  4:49 PM  Result Value Ref Range   Preg Test, Ur NEGATIVE NEGATIVE    IMAGING No results found.  MAU COURSE Orders Placed This Encounter  Procedures  . Urinalysis, Routine w reflex microscopic  . Pregnancy, urine POC  . Discharge patient   Meds ordered this encounter  Medications  . Prenatal Multivit-Min-Fe-FA (PRENATAL VITAMINS) 0.8 MG tablet    Sig: Take 1 tablet by mouth daily.    Dispense:  30 tablet    Refill:  12    Order Specific Question:   Supervising Provider    Answer:   Woodroe Mode [[id number]]  . ibuprofen (ADVIL,MOTRIN) 600 MG tablet    Sig: Take 1 tablet (600 mg total) by mouth every 6 (six) hours as needed for moderate pain.    Dispense:  30 tablet    Refill:  1    Order Specific Question:   Supervising Provider    Answer:   Woodroe Mode [[id number]]    MDM - Neg UPT. Recommend taking home UPT in no menses in two weeks. - TTC. Rx PNV. Pregnancy precautions.  - Musculoskeletal pain, HA w.out evidence of emergent condition. Rx IBU, Ice/heat PRN.   ASSESSMENT 1. Pregnancy examination or test, negative result   2. Backache without radiation   3. Nausea   4. Attempting to conceive     PLAN Discharge home in stable condition. Pregnancy precautions Follow-up Information    Ob/Gyn of  your choice Follow up.   Why:  as needed if you do not get your period or for prenatal care       [facility] Follow up.  Why:  in pregnancy emergencies Contact information: [address] [id number] [place] [facility].   Specialty:  Emergency Medicine Why:  in emergencies Contact information: [address] [id number] [place] [phone]         Allergies as of 07/20/2017      Reactions   Hydrocodone Other (See Comments)   Flares gallbladder   Codeine Hives      Medication List    STOP taking these medications   cephALEXin 500 MG capsule Commonly known as:  KEFLEX   traMADol 50 MG tablet Commonly known as:  ULTRAM     TAKE these medications   ibuprofen 600 MG tablet Commonly known as:  ADVIL,MOTRIN Take 1 tablet (600 mg total) by mouth every 6 (six) hours as needed for moderate pain.   Prenatal Vitamins 0.8 MG tablet Take 1 tablet by mouth daily.        Tamala Julian, [place], [place] 07/20/2017  7:31 PM

## 2017-07-20 NOTE — Discharge Instructions (Signed)
In late 2019, the [facility] will be moving to the [place]. At that time, the MAU (Maternity Admissions Unit), where you are being seen today, will no longer take care of non-pregnant patients. We strongly encourage you to find a doctor's office before that time, so that you can be seen with any GYN concerns, like vaginal discharge, urinary tract infection, etc.. in a timely manner.  In order to make an office visit more convenient, the [facility] will be offering evening hours with same-day appointments, walk-in appointments and scheduled appointments available during this time.  Center for [facility] Hours: Monday - 8am - 7:30 pm with walk-in between 4pm- 7:30 pm Tuesday - 8 am - 5 pm (starting 10/20/17 we will be open late and accepting walk-ins from 4pm - 7:30pm) Wednesday - 8 am - 5 pm (starting 01/20/18 we will be open late and accepting walk-ins from 4pm - 7:30pm) Thursday 8 am - 5 pm (starting 04/22/18 we will be open late and accepting walk-ins from 4pm - 7:30pm) Friday 8 am - 5 pm  For an appointment please call the [facility] for [place] [facility] at [phone]  For urgent needs, [facility] is also available for management of urgent GYN complaints such as vaginal discharge or urinary tract infections.      Preparing for Pregnancy If you are considering becoming pregnant, make an appointment to see your regular health care provider to learn how to prepare for a safe and healthy pregnancy (preconception care). During a preconception care visit, your health care provider will:  Do a complete physical exam, including a Pap test.  Take a complete medical history.  Give you information, answer your questions, and help you resolve problems.  Preconception checklist Medical history  Tell your health care provider about any current or past medical conditions. Your pregnancy or  your ability to become pregnant may be affected by chronic conditions, such as diabetes, chronic hypertension, and thyroid problems.  Include your family's medical history as well as your partner's medical history.  Tell your health care provider about any history of STIs (sexually transmitted infections).These can affect your pregnancy. In some cases, they can be passed to your baby. Discuss any concerns that you have about STIs.  If indicated, discuss the benefits of genetic testing. This testing will show whether there are any genetic conditions that may be passed from you or your partner to your baby.  Tell your health care provider about: ? Any problems you have had with conception or pregnancy. ? Any medicines you take. These include vitamins, herbal supplements, and over-the-counter medicines. ? Your history of immunizations. Discuss any vaccinations that you may need.  Diet  Ask your health care provider what to include in a healthy diet that has a balance of nutrients. This is especially important when you are pregnant or preparing to become pregnant.  Ask your health care provider to help you reach a healthy weight before pregnancy. ? If you are overweight, you may be at higher risk for certain complications, such as high blood pressure, diabetes, and preterm birth. ? If you are underweight, you are more likely to have a baby who has a low birth weight.  Lifestyle, work, and home  Let your health care provider know: ? About any lifestyle habits that you have, such as alcohol use, drug use, or smoking. ? About recreational activities that may put you at  risk during pregnancy, such as downhill skiing and certain exercise programs. ? Tell your health care provider about any international travel, especially any travel to places with an active [place] virus outbreak. ? About harmful substances that you may be exposed to at work or at home. These include chemicals, pesticides, radiation,  or even litter boxes. ? If you do not feel safe at home.  Mental health  Tell your health care provider about: ? Any history of mental health conditions, including feelings of depression, sadness, or anxiety. ? Any medicines that you take for a mental health condition. These include herbs and supplements.  Home instructions to prepare for pregnancy Lifestyle  Eat a balanced diet. This includes fresh fruits and vegetables, whole grains, lean meats, low-fat dairy products, healthy fats, and foods that are high in fiber. Ask to meet with a nutritionist or registered dietitian for assistance with meal planning and goals.  Get regular exercise. Try to be active for at least 30 minutes a day on most days of the week. Ask your health care provider which activities are safe during pregnancy.  Do not use any products that contain nicotine or tobacco, such as cigarettes and e-cigarettes. If you need help quitting, ask your health care provider.  Do not drink alcohol.  Do not take illegal drugs.  Maintain a healthy weight. Ask your health care provider what weight range is right for you.  General instructions  Keep an accurate record of your menstrual periods. This makes it easier for your health care provider to determine your baby's due date.  Begin taking prenatal vitamins and folic acid supplements daily as directed by your health care provider.  Manage any chronic conditions, such as high blood pressure and diabetes, as told by your health care provider. This is important.  How do I know that I am pregnant? You may be pregnant if you have been sexually active and you miss your period. Symptoms of early pregnancy include:  Mild cramping.  Very light vaginal bleeding (spotting).  Feeling unusually tired.  Nausea and vomiting (morning sickness).  If you have any of these symptoms and you suspect that you might be pregnant, you can take a home pregnancy test. These tests check for a  hormone in your urine (human chorionic gonadotropin, or hCG). A woman's body begins to make this hormone during early pregnancy. These tests are very accurate. Wait until at least the first day after you miss your period to take one. If the test shows that you are pregnant (you get a positive result), call your health care provider to make an appointment for prenatal care. What should I do if I become pregnant?  Make an appointment with your health care provider as soon as you suspect you are pregnant.  Do not use any products that contain nicotine, such as cigarettes, chewing tobacco, and e-cigarettes. If you need help quitting, ask your health care provider.  Do not drink alcoholic beverages. Alcohol is related to a number of birth defects.  Avoid toxic odors and chemicals.  You may continue to have sexual intercourse if it does not cause pain or other problems, such as vaginal bleeding. This information is not intended to replace advice given to you by your health care provider. Make sure you discuss any questions you have with your health care provider. Document Released: 06/19/2008 Document Revised: 03/04/2016 Document Reviewed: 01/27/2016 Elsevier Interactive Patient Education  2018 [employer].   Back Pain, Adult Many adults have back pain  from time to time. Common causes of back pain include:  A strained muscle or ligament.  Wear and tear (degeneration) of the spinal disks.  Arthritis.  A hit to the back.  Back pain can be short-lived (acute) or last a long time (chronic). A physical exam, lab tests, and imaging studies may be done to find the cause of your pain. Follow these instructions at home: Managing pain and stiffness  Take over-the-counter and prescription medicines only as told by your health care provider.  If directed, apply heat to the affected area as often as told by your health care provider. Use the heat source that your health care provider recommends,  such as a moist heat pack or a heating pad. ? Place a towel between your skin and the heat source. ? Leave the heat on for 20-30 minutes. ? Remove the heat if your skin turns bright red. This is especially important if you are unable to feel pain, heat, or cold. You have a greater risk of getting burned.  If directed, apply ice to the injured area: ? Put ice in a plastic bag. ? Place a towel between your skin and the bag. ? Leave the ice on for 20 minutes, 2-3 times a day for the first 2-3 days. Activity  Do not stay in bed. Resting more than 1-2 days can delay your recovery.  Take short walks on even surfaces as soon as you are able. Try to increase the length of time you walk each day.  Do not sit, drive, or stand in one place for more than 30 minutes at a time. Sitting or standing for long periods of time can put stress on your back.  Use proper lifting techniques. When you bend and lift, use positions that put less stress on your back: ? [place] your knees. ? Keep the load close to your body. ? Avoid twisting.  Exercise regularly as told by your health care provider. Exercising will help your back heal faster. This also helps prevent back injuries by keeping muscles strong and flexible.  Your health care provider may recommend that you see a physical therapist. This person can help you come up with a safe exercise program. Do any exercises as told by your physical therapist. Lifestyle  Maintain a healthy weight. Extra weight puts stress on your back and makes it difficult to have good posture.  Avoid activities or situations that make you feel anxious or stressed. Learn ways to manage anxiety and stress. One way to manage stress is through exercise. Stress and anxiety increase muscle tension and can make back pain worse. General instructions  Sleep on a firm mattress in a comfortable position. Try lying on your side with your knees slightly bent. If you lie on your back, put a pillow  under your knees.  Follow your treatment plan as told by your health care provider. This may include: ? Cognitive or behavioral therapy. ? Acupuncture or massage therapy. ? Meditation or yoga. Contact a health care provider if:  You have pain that is not relieved with rest or medicine.  You have increasing pain going down into your legs or buttocks.  Your pain does not improve in 2 weeks.  You have pain at night.  You lose weight.  You have a fever or chills. Get help right away if:  You develop new bowel or bladder control problems.  You have unusual weakness or numbness in your arms or legs.  You develop nausea or  vomiting.  You develop abdominal pain.  You feel faint. Summary  Many adults have back pain from time to time. A physical exam, lab tests, and imaging studies may be done to find the cause of your pain.  Use proper lifting techniques. When you bend and lift, use positions that put less stress on your back.  Take over-the-counter and prescription medicines and apply heat or ice as directed by your health care provider. This information is not intended to replace advice given to you by your health care provider. Make sure you discuss any questions you have with your health care provider. Document Released: 07/07/2005 Document Revised: 08/11/2016 Document Reviewed: 08/11/2016 Elsevier Interactive Patient Education  [employer].

## 2017-07-20 NOTE — MAU Note (Signed)
Pt reports she has been having headaches, dizziness back pain x 1 week. Thinks she might be pregnant

## 2017-09-28 ENCOUNTER — Other Ambulatory Visit: Payer: Self-pay

## 2017-09-28 ENCOUNTER — Emergency Department (HOSPITAL_COMMUNITY)
Admission: EM | Admit: 2017-09-28 | Discharge: 2017-09-28 | Disposition: A | Discharge status: Home / Self Care | Payer: Self-pay | Attending: Emergency Medicine | Admitting: Emergency Medicine

## 2017-09-28 ENCOUNTER — Encounter (HOSPITAL_COMMUNITY): Payer: Self-pay

## 2017-09-28 DIAGNOSIS — Z79899 Other long term (current) drug therapy: Secondary | ICD-10-CM | POA: Insufficient documentation

## 2017-09-28 DIAGNOSIS — J45901 Unspecified asthma with (acute) exacerbation: Secondary | ICD-10-CM | POA: Insufficient documentation

## 2017-09-28 DIAGNOSIS — F17210 Nicotine dependence, cigarettes, uncomplicated: Secondary | ICD-10-CM | POA: Insufficient documentation

## 2017-09-28 DIAGNOSIS — A084 Viral intestinal infection, unspecified: Secondary | ICD-10-CM | POA: Insufficient documentation

## 2017-09-28 LAB — COMPREHENSIVE METABOLIC PANEL
ALBUMIN: 4 g/dL (ref 3.5–5.0)
ALK PHOS: 83 U/L (ref 38–126)
ALT: 24 U/L (ref 14–54)
AST: 33 U/L (ref 15–41)
Anion gap: 11 (ref 5–15)
BILIRUBIN TOTAL: 1 mg/dL (ref 0.3–1.2)
BUN: 15 mg/dL (ref 6–20)
CO2: 22 mmol/L (ref 22–32)
CREATININE: 1.17 mg/dL — AB (ref 0.44–1.00)
Calcium: 9.1 mg/dL (ref 8.9–10.3)
Chloride: 99 mmol/L — ABNORMAL LOW (ref 101–111)
GFR calc Af Amer: >60 mL/min (ref >60)
GFR calc non Af Amer: 59 mL/min — ABNORMAL LOW (ref >60)
GLUCOSE: 106 mg/dL — AB (ref 65–99)
POTASSIUM: 3.6 mmol/L (ref 3.5–5.1)
Sodium: 132 mmol/L — ABNORMAL LOW (ref 135–145)
TOTAL PROTEIN: 8.1 g/dL (ref 6.5–8.1)

## 2017-09-28 LAB — URINALYSIS, ROUTINE W REFLEX MICROSCOPIC
BILIRUBIN URINE: NEGATIVE
Glucose, UA: NEGATIVE mg/dL
Ketones, ur: NEGATIVE mg/dL
Nitrite: NEGATIVE
Protein, ur: NEGATIVE mg/dL
SPECIFIC GRAVITY, URINE: 1.019 (ref 1.005–1.030)
pH: 5 (ref 5.0–8.0)

## 2017-09-28 LAB — CBC
HEMATOCRIT: 45.4 % (ref 36.0–46.0)
HEMOGLOBIN: 14.6 g/dL (ref 12.0–15.0)
MCH: 27.7 pg (ref 26.0–34.0)
MCHC: 32.2 g/dL (ref 30.0–36.0)
MCV: 86 fL (ref 78.0–100.0)
Platelets: 391 10*3/uL (ref 150–400)
RBC: 5.28 MIL/uL — AB (ref 3.87–5.11)
RDW: 15.7 % — AB (ref 11.5–15.5)
WBC: 10.8 10*3/uL — AB (ref 4.0–10.5)

## 2017-09-28 LAB — I-STAT BETA HCG BLOOD, ED (MC, WL, AP ONLY): I-stat hCG, quantitative: <5 m[IU]/mL (ref <5)

## 2017-09-28 LAB — LIPASE, BLOOD: Lipase: 26 U/L (ref 11–51)

## 2017-09-28 MED ORDER — ONDANSETRON HCL 4 MG PO TABS: 4.0000 mg | ORAL_TABLET | Freq: Four times a day (QID) | ORAL | 0 refills | Status: DC

## 2017-09-28 MED ORDER — ONDANSETRON 4 MG PO TBDP
8.0000 mg | ORAL_TABLET | Freq: Once | ORAL | Status: AC
  Administered 2017-09-28: 8 mg via ORAL
  Filled 2017-09-28: qty 2

## 2017-09-28 NOTE — ED Provider Notes (Signed)
Patient placed in Quick Look pathway, seen and evaluated   Chief Complaint: nausea and vomiting  HPI:   Pt presents to ED with complaint of nausea, vomiting, diarrhea for two days. Reports body aches and chills. Denies abdominal pain. Reports generalized weakness. No fever or chills. No URI symptoms.   ROS: Positive for nausea, vomiting, diarrhea, weakness, body aches. Negative for abdominal pain, urinary symptoms, vaginal discharge or bleeding. Last menustrual cycle 1 week ago. Tried day time and night time cold and flu meds with some relief  Physical Exam:   Gen: No distress  Neuro: Awake and Alert  Skin: Warm    Focused Exam: Oral mucosa dry. Lungs clear to ascultation bilaterally, pt is tachycardic, otherwise regular HR and rythm. Abdomen non tender, soft, no guarding.  Doubt acute abdomen. No imaging indicated at this time. Will get labs, zofran ordered.  Pt does appear dry. Asking for ice chips, will give some since going back to waiting room.   Vitals:   09/28/17 1324 09/28/17 1330  BP: 124/90   Pulse: (!) 118   Resp: 18   Temp: 99 F (37.2 C)   TempSrc: Oral   SpO2: 99%   Weight:  69.4 kg (153 lb)     Initiation of care has begun. The patient has been counseled on the process, plan, and necessity for staying for the completion/evaluation, and the remainder of the medical screening examination    Jeannett Senior, Hershal Coria 09/28/17 [place], MD 09/28/17 1450

## 2017-09-28 NOTE — Discharge Instructions (Signed)
Please read attached information. If you experience any new or worsening signs or symptoms please return to the emergency room for evaluation. Please follow-up with your primary care provider or specialist as discussed. Please use medication prescribed only as directed and discontinue taking if you have any concerning signs or symptoms.   °

## 2017-09-28 NOTE — ED Triage Notes (Signed)
Pt presents to the ed with complaints of 2 days of body aches, generalized weakness, nausea, vomiting and cold chills.

## 2017-09-28 NOTE — ED Notes (Signed)
Pt able to keep drink down

## 2017-09-28 NOTE — ED Provider Notes (Signed)
[facility] EMERGENCY DEPARTMENT Provider Note   CSN: [id number] Arrival date & time: 09/28/17  1317     History   Chief Complaint Chief Complaint  Patient presents with  . Influenza    HPI Elizabeth Dean is a 37 y.o. female.  HPI   37 year old female presents today with complaints of nausea vomiting and diarrhea.  Patient symptoms started 2 days ago.  She notes body aches and chills.  She denies any fever, rhinorrhea, nasal congestion or cough.  Patient notes she works at [employer] and is around multiple people, but denies any close sick contacts.  She denies any significant past medical history.  Patient notes she is taken daytime and nighttime cold and flu with some relief of symptoms.  She denies abdominal pain.  Patient has she has been tolerating ice chips.   Past Medical History:  Diagnosis Date  . Anemia   . Anxiety   . Asthma   . Bronchitis   . Depression   . GERD (gastroesophageal reflux disease)   . Panic attacks     Patient Active Problem List   Diagnosis Date Noted  . Pregnancy of unknown anatomic location 09/03/2015  . Healthcare maintenance 09/03/2015  . Hypertriglyceridemia 04/04/2014  . Asthma with acute exacerbation 04/04/2014  . Need for prophylactic vaccination and inoculation against influenza 04/04/2014  . Adjustment disorder with mixed anxiety and depressed mood 02/14/2014  . Muscle spasm 01/02/2014  . Major depression 01/02/2014  . Multiple falls 01/02/2014    Past Surgical History:  Procedure Laterality Date  . CHOLECYSTECTOMY      OB History    Gravida Para Term Preterm AB Living   6 4 4   2 4    SAB TAB Ectopic Multiple Live Births   2       4       Home Medications    Prior to Admission medications   Medication Sig Start Date End Date Taking? Authorizing Provider  ibuprofen (ADVIL,MOTRIN) 600 MG tablet Take 1 tablet (600 mg total) by mouth every 6 (six) hours as needed for moderate pain. 07/20/17    Tamala Julian, [place], CNM  ondansetron (ZOFRAN) 4 MG tablet Take 1 tablet (4 mg total) by mouth every 6 (six) hours. 09/28/17   Dietrich Samuelson, Dellis Filbert, PA-C  Prenatal Multivit-Min-Fe-FA (PRENATAL VITAMINS) 0.8 MG tablet Take 1 tablet by mouth daily. 07/20/17   Manya Silvas, CNM    Family History Family History  Problem Relation Age of Onset  . Heart disease Maternal Grandmother   . Hypertension Maternal Grandmother     Social History Social History   Tobacco Use  . Smoking status: Current Some Day Smoker    Packs/day: 1.00    Types: Cigarettes  . Smokeless tobacco: Never Used  Substance Use Topics  . Alcohol use: Yes    Comment: socially  . Drug use: No     Allergies   Hydrocodone and Codeine   Review of Systems Review of Systems  All other systems reviewed and are negative.    Physical Exam Updated Vital Signs BP (!) 127/94 (BP Location: Right Arm)   Pulse 97   Temp 98.3 F (36.8 C)   Resp 18   Wt 69.4 kg (153 lb)   LMP 09/21/2017   SpO2 99%   BMI 33.11 kg/m   Physical Exam  Constitutional: She is oriented to person, place, and time. She appears well-developed and well-nourished.  HENT:  Head: Normocephalic and atraumatic.  Nose: No  rhinorrhea.  Eyes: Conjunctivae are normal. Pupils are equal, round, and reactive to light. Right eye exhibits no discharge. Left eye exhibits no discharge. No scleral icterus.  Neck: Normal range of motion. No JVD present. No tracheal deviation present.  Pulmonary/Chest: Effort normal and breath sounds normal. No stridor. No respiratory distress. She has no wheezes. She has no rales. She exhibits no tenderness.  Abdominal: Soft. She exhibits no distension and no mass. There is no tenderness. There is no rebound and no guarding. No hernia.  Neurological: She is alert and oriented to person, place, and time. Coordination normal.  Psychiatric: She has a normal mood and affect. Her behavior is normal. Judgment and thought content normal.    Nursing note and vitals reviewed.    ED Treatments / Results  Labs (all labs ordered are listed, but only abnormal results are displayed) Labs Reviewed  CBC - Abnormal; Notable for the following components:      Result Value   WBC 10.8 (*)    RBC 5.28 (*)    RDW 15.7 (*)    All other components within normal limits  COMPREHENSIVE METABOLIC PANEL - Abnormal; Notable for the following components:   Sodium 132 (*)    Chloride 99 (*)    Glucose, Bld 106 (*)    Creatinine, Ser 1.17 (*)    GFR calc non Af Amer 59 (*)    All other components within normal limits  URINALYSIS, ROUTINE W REFLEX MICROSCOPIC - Abnormal; Notable for the following components:   APPearance HAZY (*)    Hgb urine dipstick SMALL (*)    Leukocytes, UA SMALL (*)    Bacteria, UA RARE (*)    Squamous Epithelial / LPF 0-5 (*)    All other components within normal limits  LIPASE, BLOOD  I-STAT BETA HCG BLOOD, ED ([facility], [facility], [facility] ONLY)    EKG  EKG Interpretation None       Radiology No results found.  Procedures Procedures (including critical care time)  Medications Ordered in ED Medications  ondansetron (ZOFRAN-ODT) disintegrating tablet 8 mg (8 mg Oral Given 09/28/17 1755)     Initial Impression / Assessment and Plan / ED Course  I have reviewed the triage vital signs and the nursing notes.  Pertinent labs & imaging results that were available during my care of the patient were reviewed by me and considered in my medical decision making (see chart for details).      Final Clinical Impressions(s) / ED Diagnoses   Final diagnoses:  Viral gastroenteritis    Labs: Stat beta-hCG, urinalysis, CBC, CMP, lipase  Imaging:  Consults:  Therapeutics: Zofran  Discharge Meds: zofran  Assessment/Plan: 37 year old female presents today with nausea vomiting diarrhea.  Most likely gastroenteritis viral nature.  Afebrile with reassuring workup without significant electrolyte abnormalities other than  slightly low sodium.  Patient also having slight elevated creatinine likely secondary to dehydration.  Patient is very well-appearing in no acute distress.  She has a soft nontender abdomen.  I have very low suspicion for twins in this patient.  Patient be given antinausea medication here trial of oral challenge.  She will most likely be discharged with strict return precautions and follow-up information if she tolerates p.o.  Patient verbalized understanding and agreement to today's plan had no further questions or concerns      ED Discharge Orders        Ordered    ondansetron (ZOFRAN) 4 MG tablet  Every 6 hours  09/28/17 1847       Okey Regal, PA-C 09/28/17 Rickey Barbara, MD 09/28/17 2251

## 2018-03-30 ENCOUNTER — Encounter: Payer: Self-pay | Admitting: Family Medicine

## 2018-03-30 ENCOUNTER — Ambulatory Visit: Payer: Self-pay | Attending: Family Medicine | Admitting: Family Medicine

## 2018-03-30 ENCOUNTER — Other Ambulatory Visit: Payer: Self-pay

## 2018-03-30 ENCOUNTER — Encounter: Payer: Self-pay | Admitting: Licensed Clinical Social Worker

## 2018-03-30 VITALS — BP 135/94 | HR 93 | Temp 98.7°F | Resp 18 | Ht 60.0 in | Wt 172.0 lb

## 2018-03-30 DIAGNOSIS — J309 Allergic rhinitis, unspecified: Secondary | ICD-10-CM

## 2018-03-30 DIAGNOSIS — F17210 Nicotine dependence, cigarettes, uncomplicated: Secondary | ICD-10-CM | POA: Insufficient documentation

## 2018-03-30 DIAGNOSIS — J4520 Mild intermittent asthma, uncomplicated: Secondary | ICD-10-CM

## 2018-03-30 DIAGNOSIS — Z9049 Acquired absence of other specified parts of digestive tract: Secondary | ICD-10-CM | POA: Insufficient documentation

## 2018-03-30 DIAGNOSIS — Z111 Encounter for screening for respiratory tuberculosis: Secondary | ICD-10-CM

## 2018-03-30 DIAGNOSIS — Z2821 Immunization not carried out because of patient refusal: Secondary | ICD-10-CM | POA: Insufficient documentation

## 2018-03-30 DIAGNOSIS — F411 Generalized anxiety disorder: Secondary | ICD-10-CM

## 2018-03-30 DIAGNOSIS — F329 Major depressive disorder, single episode, unspecified: Secondary | ICD-10-CM | POA: Insufficient documentation

## 2018-03-30 DIAGNOSIS — R030 Elevated blood-pressure reading, without diagnosis of hypertension: Secondary | ICD-10-CM | POA: Insufficient documentation

## 2018-03-30 DIAGNOSIS — K219 Gastro-esophageal reflux disease without esophagitis: Secondary | ICD-10-CM | POA: Insufficient documentation

## 2018-03-30 DIAGNOSIS — Z7689 Persons encountering health services in other specified circumstances: Secondary | ICD-10-CM | POA: Insufficient documentation

## 2018-03-30 MED ORDER — FLUTICASONE PROPIONATE 50 MCG/ACT NA SUSP: 2.0000 | Freq: Every day | NASAL | 6 refills | Status: DC

## 2018-03-30 MED ORDER — SERTRALINE HCL 50 MG PO TABS: 50.0000 mg | ORAL_TABLET | Freq: Every day | ORAL | 1 refills | Status: DC

## 2018-03-30 NOTE — Progress Notes (Signed)
Subjective:    Patient ID: Elizabeth Dean, female    DOB: 04-09-1981, 38 y.o.   MRN: [id number]  HPI 37 year old female who returns to reestablish care of chronic medical issues including asthma and patient with complaint of recurrent nasal congestion secondary to allergic rhinitis.  Patient was last seen in the office on November 01, 2015.  Patient has used Flonase in the past with good success to relieve nasal allergy symptoms.  Patient states that her asthma is mostly triggered by hot weather.  Patient denies any current shortness of breath, cough or wheeze.  No nighttime awakening secondary to shortness of breath, cough or wheezing and patient has had no recent emergency department visit secondary to asthma.  Patient also with a history of anxiety.  Patient states that she is applying for a new job and states that she would like to be on something on a daily basis to help reduce her anxiety.  Patient states that being around people who are anxious tends to make her anxious as well.  Patient states that she feels stressed when other people are anxious or if she is under pressure.  Patient states that she sometimes has difficulty falling asleep secondary to thinking about things that she needs to do the next day or things that she is worried about.  Patient states that she was on medication in the past for anxiety.  Patient reports that she will need a TB skin test as part of her new job application.  Past Medical History:  Diagnosis Date  . Anemia   . Anxiety   . Asthma   . Bronchitis   . Depression   . GERD (gastroesophageal reflux disease)   . Panic attacks    Past Surgical History:  Procedure Laterality Date  . CHOLECYSTECTOMY     Family History  Problem Relation Age of Onset  . Heart disease Maternal Grandmother   . Hypertension Maternal Grandmother    Social History   Tobacco Use  . Smoking status: Current Some Day Smoker    Packs/day: 1.00    Types: E-cigarettes  . Smokeless  tobacco: Never Used  Substance Use Topics  . Alcohol use: Yes    Comment: socially  . Drug use: No     Review of Systems  Constitutional: Negative for chills, fatigue and fever.  HENT: Positive for congestion. Negative for sinus pressure.   Respiratory: Negative for cough and shortness of breath.   Cardiovascular: Positive for leg swelling (Patient is occasional swelling in her ankles which goes away overnight). Negative for chest pain and palpitations.  Gastrointestinal: Negative for abdominal pain and constipation.  Genitourinary: Negative for dysuria and frequency.  Musculoskeletal: Negative for arthralgias and back pain.  Neurological: Negative for dizziness and headaches.  Psychiatric/Behavioral: Negative for suicidal ideas. The patient is nervous/anxious.       Objective:   Physical Exam BP (!) 135/94   Pulse 93   Temp 98.7 F (37.1 C) (Oral)   Resp 18   Ht 5' (1.524 m)   Wt 172 lb (78 kg)   LMP 03/30/2018   SpO2 97%   BMI 33.59 kg/m  General-well-nourished, well-developed, overweight for height female in no acute distress ENT-TMs gray, nares with moderate edema of the nasal turbinates with mild clear nasal discharge, patient with mild posterior pharynx erythema/cobblestoning Neck-supple, no lymphadenopathy, no thyromegaly, no carotid bruit Lungs-clear to auscultation bilaterally. Cardiovascular-regular rate and rhythm Abdomen-soft, nontender Back-no CVA tenderness Extremities-no edema Psychologic-normal mood and judgment  Assessment & Plan:  1. Mild intermittent asthma without complication Patient reports that her asthma is generally triggered by hot weather/heat.  Patient reports that she otherwise feels well.  Patient declined a flu shot at today's visit.  Patient does not need refill of albuterol inhaler.  2. Allergic rhinitis, unspecified seasonality, unspecified trigger Patient reports that Flonase has worked well for her in the past.  Patient is  given new prescription to use as needed for allergic rhinitis. - fluticasone (FLONASE) 50 MCG/ACT nasal spray; Place 2 sprays into both nostrils daily.  Dispense: 16 g; Refill: 6  3. Generalized anxiety disorder On review of chart, patient has had long-standing issues with anxiety.  Will have social work consult with the patient at today's visit or schedule follow-up with the patient.  Patient has been on Paxil and Prozac in the past and patient states that these medications did not really work well for her.  Will have patient start sertraline 50 mg and for the first week, patient will take half pill at bedtime for 7 days and then increase to 50 mg.  Patient is aware that it may take 4 to 6 weeks for the medicine to work as well for her as it is going to work.  Patient will return in 4 weeks.  4. Screening for tuberculosis Patient needs placement of PPD for screening for tuberculosis.  Unfortunately this was not available today but will be obtained so that patient can return tomorrow to have this done.  *Patient was offered but declined influenza immunization at today's visit  *Patient was slightly elevated blood pressure and complaint of occasional ankle swelling.  Patient was encouraged to follow a low-sodium diet and to continue regular exercise.  Blood pressure will be rechecked at her next visit.  An After Visit Summary was printed and given to the patient.  Return in about 4 weeks (around 04/27/2018) for new medication.

## 2018-03-30 NOTE — Progress Notes (Signed)
Declined flu  Pain 0 Tb testing requested today, for a job  [facility] preferred pharmacy

## 2018-03-30 NOTE — Patient Instructions (Signed)

## 2018-03-31 ENCOUNTER — Ambulatory Visit: Payer: Self-pay | Attending: Family Medicine | Admitting: *Deleted

## 2018-03-31 DIAGNOSIS — Z111 Encounter for screening for respiratory tuberculosis: Secondary | ICD-10-CM

## 2018-03-31 NOTE — Progress Notes (Signed)
Tuberculin skin test applied to right  ventral forearm. Informed of what patient will see if test is positive.  Wheal not produced.

## 2018-04-27 ENCOUNTER — Ambulatory Visit: Payer: Self-pay | Admitting: Family Medicine

## 2018-05-08 ENCOUNTER — Encounter (HOSPITAL_COMMUNITY): Payer: Self-pay | Admitting: Emergency Medicine

## 2018-05-08 ENCOUNTER — Emergency Department (HOSPITAL_COMMUNITY)
Admission: EM | Admit: 2018-05-08 | Discharge: 2018-05-08 | Disposition: A | Discharge status: Home / Self Care | Payer: Self-pay | Attending: Emergency Medicine | Admitting: Emergency Medicine

## 2018-05-08 DIAGNOSIS — J45901 Unspecified asthma with (acute) exacerbation: Secondary | ICD-10-CM | POA: Insufficient documentation

## 2018-05-08 DIAGNOSIS — S39012A Strain of muscle, fascia and tendon of lower back, initial encounter: Secondary | ICD-10-CM | POA: Insufficient documentation

## 2018-05-08 DIAGNOSIS — M7918 Myalgia, other site: Secondary | ICD-10-CM

## 2018-05-08 DIAGNOSIS — F17210 Nicotine dependence, cigarettes, uncomplicated: Secondary | ICD-10-CM | POA: Insufficient documentation

## 2018-05-08 DIAGNOSIS — Z79899 Other long term (current) drug therapy: Secondary | ICD-10-CM | POA: Insufficient documentation

## 2018-05-08 DIAGNOSIS — Y939 Activity, unspecified: Secondary | ICD-10-CM | POA: Insufficient documentation

## 2018-05-08 DIAGNOSIS — Y999 Unspecified external cause status: Secondary | ICD-10-CM | POA: Insufficient documentation

## 2018-05-08 DIAGNOSIS — Y92481 Parking lot as the place of occurrence of the external cause: Secondary | ICD-10-CM | POA: Insufficient documentation

## 2018-05-08 MED ORDER — NAPROXEN 500 MG PO TABS: 500.0000 mg | ORAL_TABLET | Freq: Two times a day (BID) | ORAL | 0 refills | Status: DC

## 2018-05-08 MED ORDER — METHOCARBAMOL 500 MG PO TABS: 1000.0000 mg | ORAL_TABLET | Freq: Four times a day (QID) | ORAL | 0 refills | Status: DC
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
## 2018-05-08 NOTE — ED Notes (Signed)
ED Provider at bedside. 
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
## 2018-05-08 NOTE — ED Notes (Signed)
Pt verbalized understanding of discharge instructions and denies any further questions at this time.   

## 2018-09-05 ENCOUNTER — Other Ambulatory Visit: Payer: Self-pay

## 2018-09-05 ENCOUNTER — Emergency Department (HOSPITAL_COMMUNITY): Payer: Self-pay

## 2018-09-05 ENCOUNTER — Emergency Department (HOSPITAL_COMMUNITY)
Admission: EM | Admit: 2018-09-05 | Discharge: 2018-09-05 | Disposition: A | Discharge status: Home / Self Care | Payer: Self-pay | Attending: Emergency Medicine | Admitting: Emergency Medicine

## 2018-09-05 ENCOUNTER — Encounter (HOSPITAL_COMMUNITY): Payer: Self-pay | Admitting: Emergency Medicine

## 2018-09-05 DIAGNOSIS — Y939 Activity, unspecified: Secondary | ICD-10-CM | POA: Insufficient documentation

## 2018-09-05 DIAGNOSIS — F17290 Nicotine dependence, other tobacco product, uncomplicated: Secondary | ICD-10-CM | POA: Insufficient documentation

## 2018-09-05 DIAGNOSIS — J45909 Unspecified asthma, uncomplicated: Secondary | ICD-10-CM | POA: Insufficient documentation

## 2018-09-05 DIAGNOSIS — Y999 Unspecified external cause status: Secondary | ICD-10-CM | POA: Insufficient documentation

## 2018-09-05 DIAGNOSIS — S63232A Subluxation of proximal interphalangeal joint of right middle finger, initial encounter: Secondary | ICD-10-CM | POA: Insufficient documentation

## 2018-09-05 DIAGNOSIS — S0993XA Unspecified injury of face, initial encounter: Secondary | ICD-10-CM | POA: Insufficient documentation

## 2018-09-05 DIAGNOSIS — S63259A Unspecified dislocation of unspecified finger, initial encounter: Secondary | ICD-10-CM

## 2018-09-05 DIAGNOSIS — Y929 Unspecified place or not applicable: Secondary | ICD-10-CM | POA: Insufficient documentation

## 2018-09-05 LAB — POC URINE PREG, ED: Preg Test, Ur: NEGATIVE

## 2018-09-05 MED ORDER — OXYCODONE HCL 5 MG PO TABS
5.0000 mg | ORAL_TABLET | Freq: Once | ORAL | Status: AC
  Administered 2018-09-05: 5 mg via ORAL
  Filled 2018-09-05: qty 1

## 2018-09-05 MED ORDER — OXYCODONE HCL 5 MG PO TABS: 5.0000 mg | ORAL_TABLET | ORAL | 0 refills | Status: DC | PRN
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
## 2018-09-05 NOTE — ED Notes (Signed)
Bed: WTR9 Expected date:  Expected time:  Means of arrival:  Comments: 
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
## 2018-09-05 NOTE — ED Notes (Signed)
Patient given discharge teaching and verbalized understanding. Patient ambulated out of ED with a steady gait. 

## 2018-09-08 ENCOUNTER — Emergency Department (HOSPITAL_COMMUNITY)
Admission: EM | Admit: 2018-09-08 | Discharge: 2018-09-08 | Disposition: A | Discharge status: Home / Self Care | Payer: Self-pay | Attending: Emergency Medicine | Admitting: Emergency Medicine

## 2018-09-08 ENCOUNTER — Encounter (HOSPITAL_COMMUNITY): Payer: Self-pay | Admitting: Emergency Medicine

## 2018-09-08 DIAGNOSIS — J45909 Unspecified asthma, uncomplicated: Secondary | ICD-10-CM | POA: Insufficient documentation

## 2018-09-08 DIAGNOSIS — F17210 Nicotine dependence, cigarettes, uncomplicated: Secondary | ICD-10-CM | POA: Insufficient documentation

## 2018-09-08 DIAGNOSIS — M79641 Pain in right hand: Secondary | ICD-10-CM | POA: Insufficient documentation

## 2018-09-08 DIAGNOSIS — Z79899 Other long term (current) drug therapy: Secondary | ICD-10-CM | POA: Insufficient documentation

## 2018-09-08 MED ORDER — OXYCODONE HCL 5 MG PO TABS: 5.0000 mg | ORAL_TABLET | ORAL | 0 refills | Status: DC | PRN

## 2018-09-08 NOTE — ED Notes (Signed)
Pt is requesting a refill of her pain medication. Pt stated that she has a 3pm appointment with orthopedic today. Pt is concerned about her co-pay. Pt encouraged to call the  business manager at the practice to inquire about a payment plan

## 2018-09-08 NOTE — Discharge Instructions (Signed)
I have prescribed 2 oxycodones which should be used sparingly until appointment with orthopedist.  These follow-up with orthopedics for your recent right middle finger dislocation.
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
## 2019-02-06 ENCOUNTER — Other Ambulatory Visit: Payer: Self-pay

## 2019-02-06 ENCOUNTER — Emergency Department (HOSPITAL_COMMUNITY): Payer: Self-pay

## 2019-02-06 ENCOUNTER — Encounter (HOSPITAL_COMMUNITY): Payer: Self-pay | Admitting: Emergency Medicine

## 2019-02-06 ENCOUNTER — Emergency Department (HOSPITAL_COMMUNITY)
Admission: EM | Admit: 2019-02-06 | Discharge: 2019-02-06 | Disposition: A | Discharge status: Home / Self Care | Payer: Self-pay | Attending: Emergency Medicine | Admitting: Emergency Medicine

## 2019-02-06 DIAGNOSIS — Y9301 Activity, walking, marching and hiking: Secondary | ICD-10-CM | POA: Insufficient documentation

## 2019-02-06 DIAGNOSIS — W108XXA Fall (on) (from) other stairs and steps, initial encounter: Secondary | ICD-10-CM | POA: Insufficient documentation

## 2019-02-06 DIAGNOSIS — S8391XA Sprain of unspecified site of right knee, initial encounter: Secondary | ICD-10-CM

## 2019-02-06 DIAGNOSIS — Y999 Unspecified external cause status: Secondary | ICD-10-CM | POA: Insufficient documentation

## 2019-02-06 DIAGNOSIS — F17210 Nicotine dependence, cigarettes, uncomplicated: Secondary | ICD-10-CM | POA: Insufficient documentation

## 2019-02-06 DIAGNOSIS — Y929 Unspecified place or not applicable: Secondary | ICD-10-CM | POA: Insufficient documentation

## 2019-02-06 DIAGNOSIS — J45909 Unspecified asthma, uncomplicated: Secondary | ICD-10-CM | POA: Insufficient documentation

## 2019-02-06 DIAGNOSIS — S838X1A Sprain of other specified parts of right knee, initial encounter: Secondary | ICD-10-CM | POA: Insufficient documentation

## 2019-02-06 MED ORDER — IBUPROFEN 800 MG PO TABS
800.0000 mg | ORAL_TABLET | Freq: Once | ORAL | Status: AC
  Administered 2019-02-06: 800 mg via ORAL
  Filled 2019-02-06: qty 1

## 2019-02-06 MED ORDER — ALBUTEROL SULFATE HFA 108 (90 BASE) MCG/ACT IN AERS
2.0000 | INHALATION_SPRAY | RESPIRATORY_TRACT | Status: DC | PRN
  Filled 2019-02-06: qty 6.7

## 2019-02-06 MED ORDER — IBUPROFEN 600 MG PO TABS: 600.0000 mg | ORAL_TABLET | Freq: Four times a day (QID) | ORAL | 0 refills | Status: DC | PRN

## 2019-02-06 NOTE — ED Notes (Addendum)
Pt transported to xray 

## 2019-02-06 NOTE — ED Triage Notes (Signed)
Pt states she fell down her steps inside her house. 2 days ago. Pt endorses increased swelling and pain of right knee and ankle. 6/10 pain.

## 2019-02-06 NOTE — ED Provider Notes (Signed)
[facility] EMERGENCY DEPARTMENT Provider Note   CSN: [id number] Arrival date & time: 02/06/19  1318     History   Chief Complaint Chief Complaint  Patient presents with  . Leg Injury    HPI Elizabeth Dean is a 38 y.o. female.     The history is provided by the patient. No language interpreter was used.     38 year old female presenting for evaluation of leg injury.  Patient reports 2 days ago she was walking down the steps, lost balance, fell forward and strike her right leg against the steps.  She denies hitting her head or loss of consciousness.  She was able to ambulate afterward but since then she has had moderate pain primarily to her right knee and right thigh.  Pain is sharp, achy, moderate in severity, nonradiating, worse with movement.  She denies any pops or cracks or any joint laxity.  Aside from propping her knees up when she rests no other specific treatment tried.  No complaints of back pain or ankle pain.  She also request for an albuterol pump.  States that she has history of asthma and from wearing her mask on a regular basis it has been difficult to breathe.  Past Medical History:  Diagnosis Date  . Anemia   . Anxiety   . Asthma   . Bronchitis   . Depression   . GERD (gastroesophageal reflux disease)   . Panic attacks     Patient Active Problem List   Diagnosis Date Noted  . Pregnancy of unknown anatomic location 09/03/2015  . Healthcare maintenance 09/03/2015  . Hypertriglyceridemia 04/04/2014  . Asthma with acute exacerbation 04/04/2014  . Need for prophylactic vaccination and inoculation against influenza 04/04/2014  . Adjustment disorder with mixed anxiety and depressed mood 02/14/2014  . Muscle spasm 01/02/2014  . Major depression 01/02/2014  . Multiple falls 01/02/2014    Past Surgical History:  Procedure Laterality Date  . CHOLECYSTECTOMY       OB History    Gravida  6   Para  4   Term  4   Preterm      AB  2    Living  4     SAB  2   TAB      Ectopic      Multiple      Live Births  4            Home Medications    Prior to Admission medications   Medication Sig Start Date End Date Taking? Authorizing Provider  fluticasone (FLONASE) 50 MCG/ACT nasal spray Place 2 sprays into both nostrils daily. 03/30/18   Fulp, Cammie, MD  methocarbamol (ROBAXIN) 500 MG tablet Take 2 tablets (1,000 mg total) by mouth 4 (four) times daily. 05/08/18   Carlisle Cater, PA-C  naproxen (NAPROSYN) 500 MG tablet Take 1 tablet (500 mg total) by mouth 2 (two) times daily. 05/08/18   Carlisle Cater, PA-C  oxyCODONE (ROXICODONE) 5 MG immediate release tablet Take 1 tablet (5 mg total) by mouth every 4 (four) hours as needed for up to 2 doses for severe pain. 09/08/18   Janeece Fitting, PA-C  sertraline (ZOLOFT) 50 MG tablet Take 1 tablet (50 mg total) by mouth daily. 03/30/18   Antony Blackbird, MD    Family History Family History  Problem Relation Age of Onset  . Heart disease Maternal Grandmother   . Hypertension Maternal Grandmother     Social History Social History  Tobacco Use  . Smoking status: Current Some Day Smoker    Packs/day: 1.00    Types: E-cigarettes  . Smokeless tobacco: Never Used  Substance Use Topics  . Alcohol use: Yes    Comment: socially  . Drug use: No     Allergies   Hydrocodone and Codeine   Review of Systems Review of Systems  All other systems reviewed and are negative.    Physical Exam Updated Vital Signs Pulse 99   Temp 98.8 F (37.1 C) (Oral)   Resp 16   Ht 4\' 9"  (1.448 m)   Wt 81.6 kg   SpO2 98%   BMI 38.95 kg/m   Physical Exam Vitals signs and nursing note reviewed.  Constitutional:      General: She is not in acute distress.    Appearance: She is well-developed.  HENT:     Head: Atraumatic.  Eyes:     Conjunctiva/sclera: Conjunctivae normal.  Neck:     Musculoskeletal: Neck supple.  Cardiovascular:     Rate and Rhythm: Normal rate and  regular rhythm.  Pulmonary:     Effort: Pulmonary effort is normal.     Breath sounds: Normal breath sounds. No wheezing.  Musculoskeletal:        General: Tenderness (Right knee: Tenderness to lateral joint line on palpation with normal knee flexion and extension no joint laxity no bruising crepitus or deformity.  Patella is located.  Mild tenderness along the right anterior thigh.  Right hip nontender to palpation) present.     Comments: No significant midline spine tenderness  Skin:    Findings: No rash.  Neurological:     Mental Status: She is alert.      ED Treatments / Results  Labs (all labs ordered are listed, but only abnormal results are displayed) Labs Reviewed - No data to display  EKG None  Radiology Dg Knee Complete 4 Views Right  Result Date: 02/06/2019 CLINICAL DATA:  38 year old who fell down stairs at her house 2 days ago with gradually worsening pain and swelling since that time. Initial encounter. EXAM: RIGHT KNEE - COMPLETE 4+ VIEW COMPARISON:  None. FINDINGS: No evidence of acute fracture or dislocation. Well-preserved joint spaces. Well-preserved bone mineral density. No intrinsic osseous abnormality. No visible joint effusion. IMPRESSION: Normal examination. Electronically Signed   By: Evangeline Dakin M.D.   On: 02/06/2019 15:57    Procedures Procedures (including critical care time)  Medications Ordered in ED Medications  albuterol (VENTOLIN HFA) 108 (90 Base) MCG/ACT inhaler 2 puff (has no administration in time range)  ibuprofen (ADVIL) tablet 800 mg (800 mg Oral Given 02/06/19 1536)     Initial Impression / Assessment and Plan / ED Course  I have reviewed the triage vital signs and the nursing notes.  Pertinent labs & imaging results that were available during my care of the patient were reviewed by me and considered in my medical decision making (see chart for details).        Pulse 99   Temp 98.8 F (37.1 C) (Oral)   Resp 16   Ht 4\' 9"   (1.448 m)   Wt 81.6 kg   SpO2 98%   BMI 38.95 kg/m    Final Clinical Impressions(s) / ED Diagnoses   Final diagnoses:  Sprain of right knee, unspecified ligament, initial encounter    ED Discharge Orders         Ordered    ibuprofen (ADVIL) 600 MG tablet  Every 6 hours  PRN     02/06/19 1627         3:14 PM Patient had a mechanical fall and injured her right knee and right thigh in the process.  She does have mild tenderness to the lateral aspect of right knee.  X-ray ordered.  No significant signs of injury to her right thigh and right hip.  Ibuprofen given for pain.  Patient also requests for an albuterol inhaler.  No active wheezing at this time and she is in no acute respiratory discomfort.  Will provide inhaler.  Patient otherwise neurovascular intact.  4:24 PM X-ray of right knee is unremarkable.  Patient placed in knee sleeves, provide crutches for support and rice therapy discussed.  Otherwise patient is stable for discharge.  Return precautions discussed.  Orthopedic referral given as needed.   Domenic Moras, PA-C 02/06/19 1628    Maudie Flakes, MD 02/07/19 [phone]

## 2019-06-01 ENCOUNTER — Ambulatory Visit: Payer: Self-pay

## 2019-06-04 IMAGING — US US OB TRANSVAGINAL
1 series · 13 of 28 positions shown · non-contrast
Comparison: None.

CLINICAL DATA: Pregnant, cramping, beta HCG 13

EXAM:
OBSTETRIC <14 WK US AND TRANSVAGINAL OB US
TECHNIQUE: Both transabdominal and transvaginal ultrasound examinations were
performed for complete evaluation of the gestation as well as the
maternal uterus, adnexal regions, and pelvic cul-de-sac.
Transvaginal technique was performed to assess early pregnancy.

[Series 1: us ob transvaginal · 0.23mm/px · 64 acquisitions, 13 frames shown]
[im 3/64]
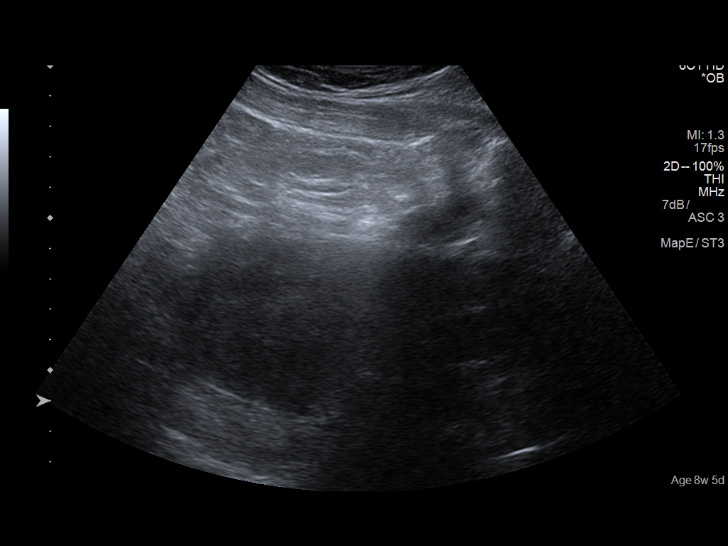
[im 8/64]
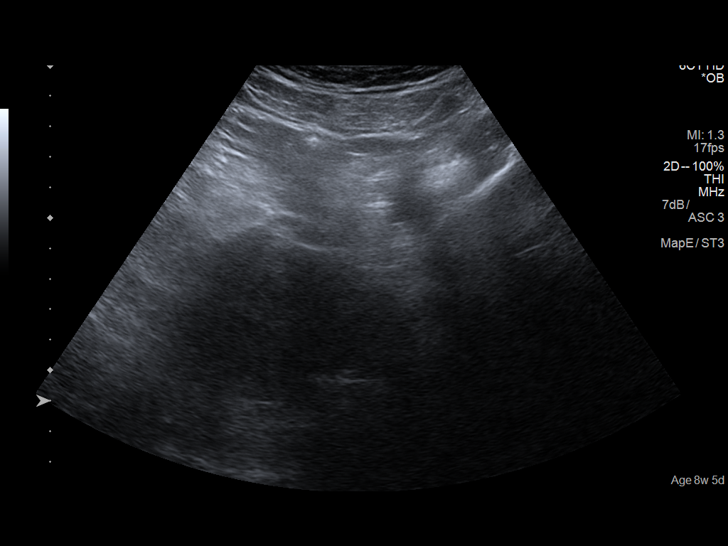
[im 12/64]
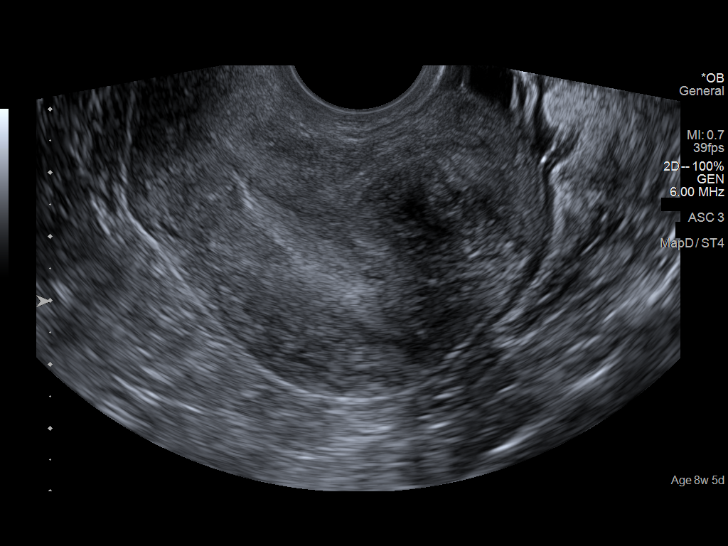
[im 17/64]
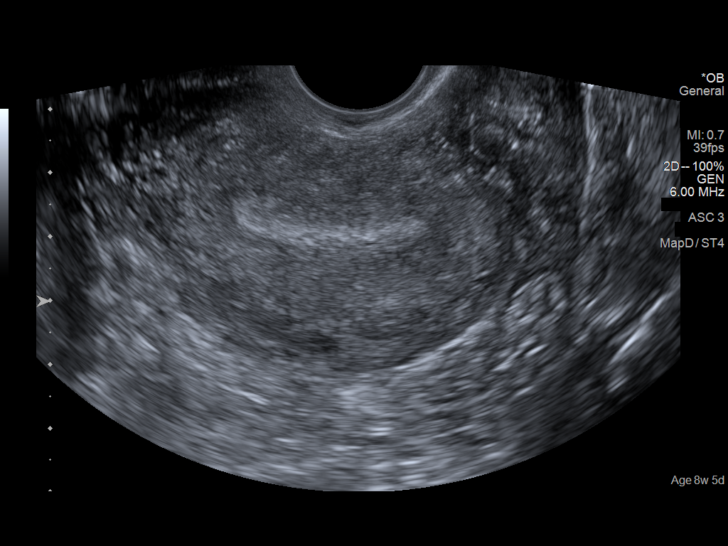
[im 22/64]
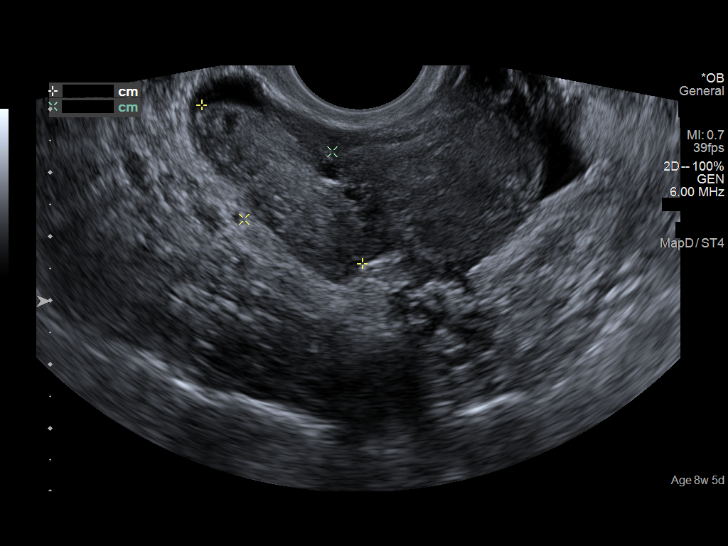
[im 26/64]
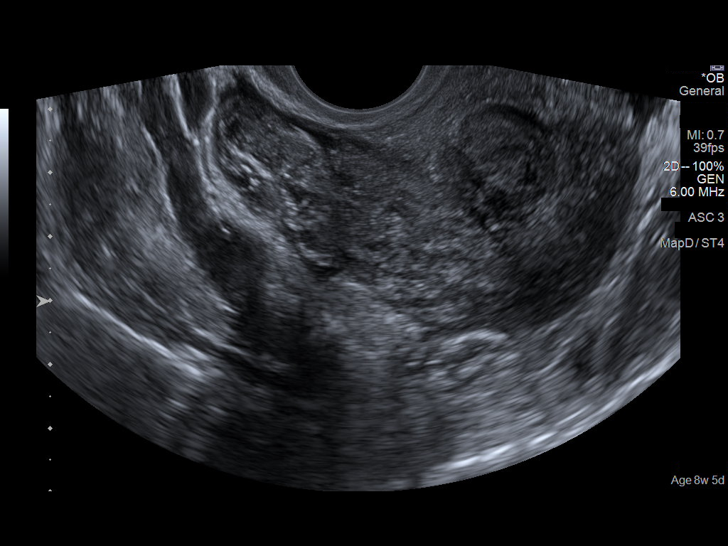
[im 33/64]
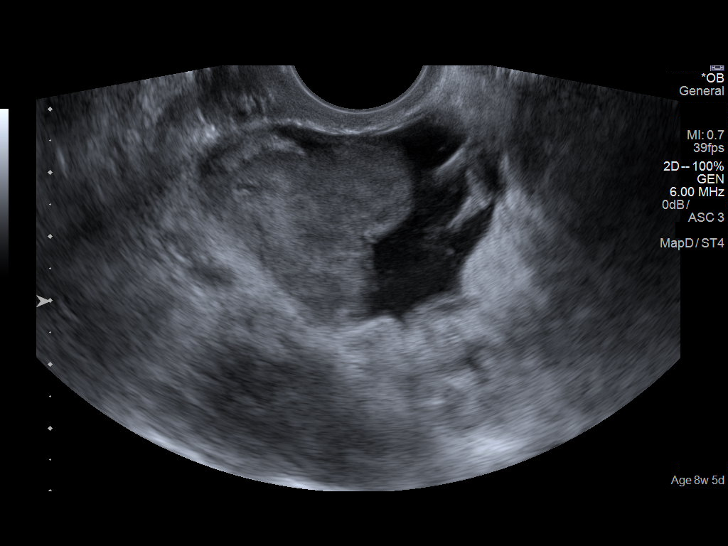
[im 38/64]
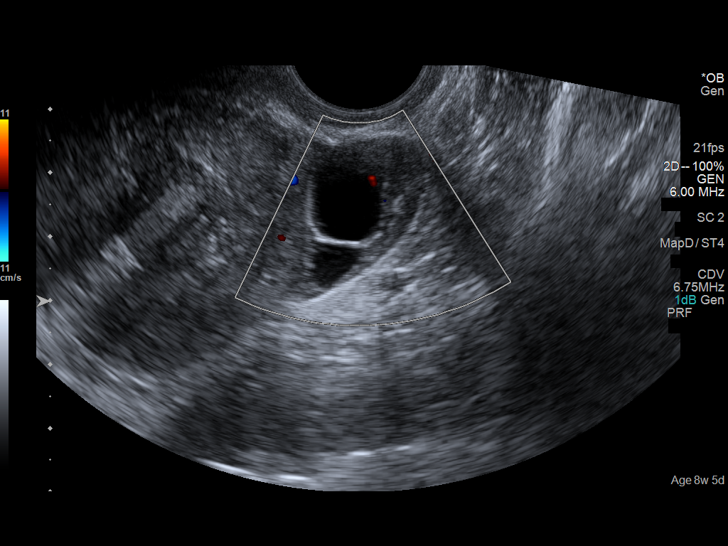
[im 43/64]
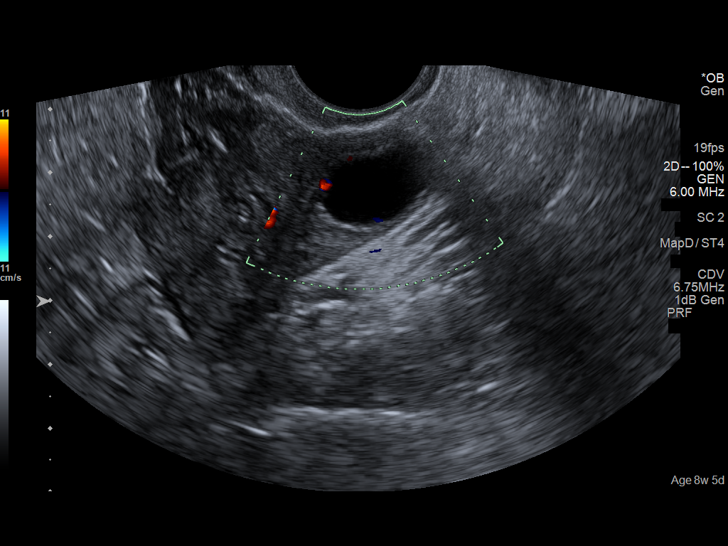
[im 47/64]
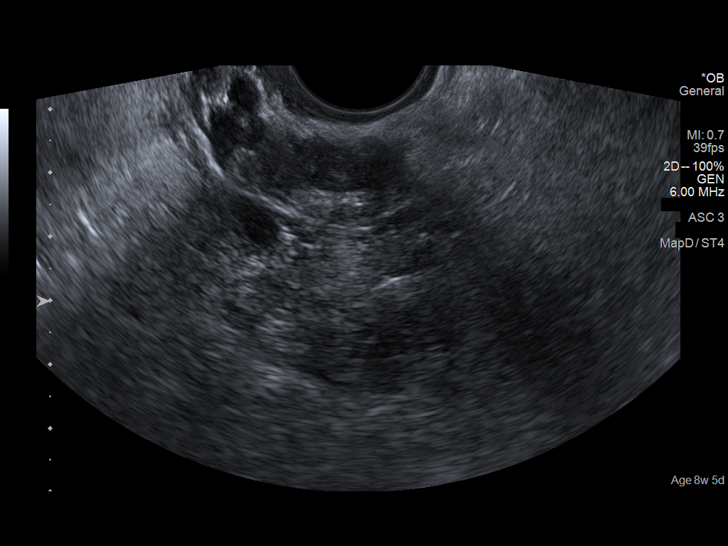
[im 52/64]
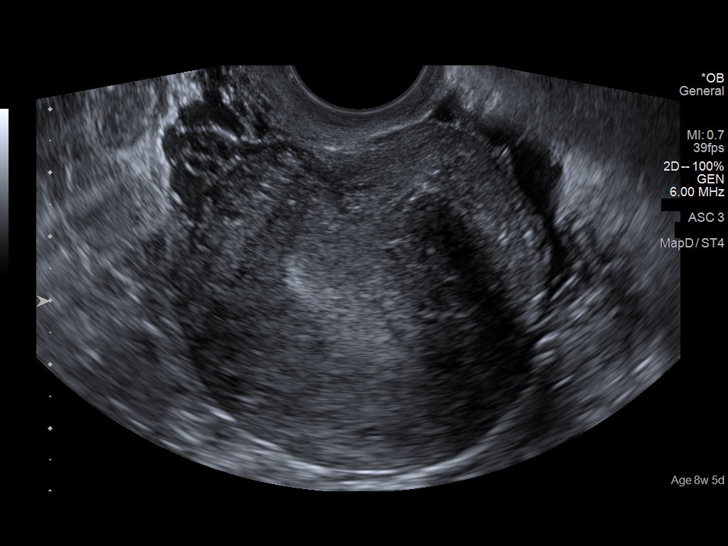
[im 57/64]
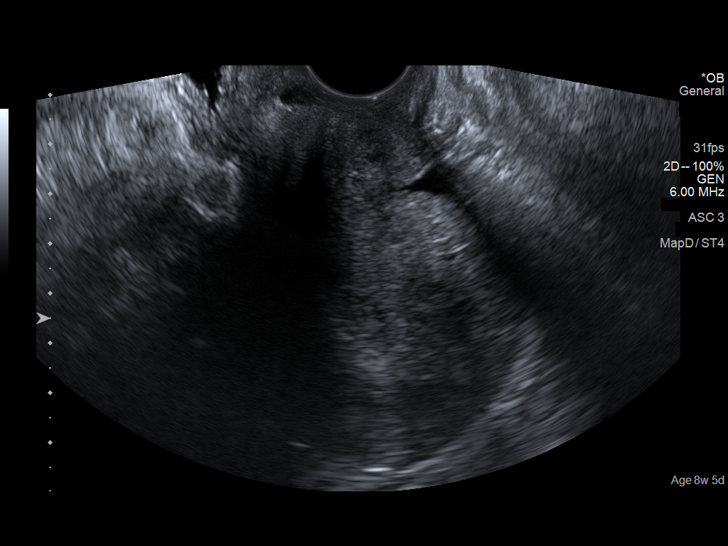
[im 61/64]
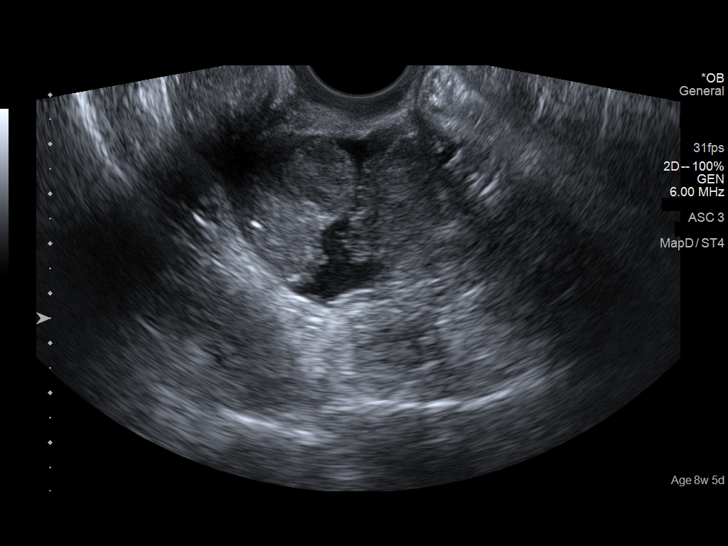

[13 of 28 positions shown; findings below may reference images not displayed]

FINDINGS: Intrauterine gestational sac: None

Yolk sac:  Not Visualized.

Embryo:  Not Visualized.

Subchorionic hemorrhage:  None visualized.

Maternal uterus/adnexae: Endometrial complex measures 11 mm.

2.4 x 2.3 x 2.1 cm intramural fibroid in the anterior uterine body.

Right ovary measures 3.5 x 1.7 x 3.1 cm and is notable for a 2.0 cm
hemorrhagic corpus luteal cyst.

Left ovary measures 3.4 x 1.9 x 3.1 cm and is notable for simple
cysts/follicles.

Moderate pelvic ascites.
IMPRESSION: No IUP is visualized. This is not unexpected given the low beta HCG.

By definition, in the setting of a positive pregnancy test, this
reflects a pregnancy of unknown location. Differential
considerations include early normal IUP, abnormal IUP/missed
abortion, or nonvisualized ectopic pregnancy.

Serial beta HCG is suggested. Consider repeat pelvic ultrasound in
14 days.

## 2019-06-29 ENCOUNTER — Encounter: Payer: Self-pay | Admitting: Family Medicine

## 2019-07-13 ENCOUNTER — Emergency Department (HOSPITAL_COMMUNITY): Payer: Self-pay

## 2019-07-13 ENCOUNTER — Other Ambulatory Visit: Payer: Self-pay

## 2019-07-13 ENCOUNTER — Emergency Department (HOSPITAL_COMMUNITY)
Admission: EM | Admit: 2019-07-13 | Discharge: 2019-07-13 | Disposition: A | Discharge status: Home / Self Care | Payer: Self-pay | Attending: Emergency Medicine | Admitting: Emergency Medicine

## 2019-07-13 DIAGNOSIS — A599 Trichomoniasis, unspecified: Secondary | ICD-10-CM | POA: Insufficient documentation

## 2019-07-13 DIAGNOSIS — R112 Nausea with vomiting, unspecified: Secondary | ICD-10-CM | POA: Insufficient documentation

## 2019-07-13 DIAGNOSIS — F17200 Nicotine dependence, unspecified, uncomplicated: Secondary | ICD-10-CM | POA: Insufficient documentation

## 2019-07-13 DIAGNOSIS — R197 Diarrhea, unspecified: Secondary | ICD-10-CM | POA: Insufficient documentation

## 2019-07-13 DIAGNOSIS — N83202 Unspecified ovarian cyst, left side: Secondary | ICD-10-CM | POA: Insufficient documentation

## 2019-07-13 DIAGNOSIS — N200 Calculus of kidney: Secondary | ICD-10-CM | POA: Insufficient documentation

## 2019-07-13 LAB — COMPREHENSIVE METABOLIC PANEL
ALT: 31 U/L (ref 0–44)
AST: 23 U/L (ref 15–41)
Albumin: 3.8 g/dL (ref 3.5–5.0)
Alkaline Phosphatase: 66 U/L (ref 38–126)
Anion gap: 11 (ref 5–15)
BUN: 11 mg/dL (ref 6–20)
CO2: 22 mmol/L (ref 22–32)
Calcium: 8.8 mg/dL — ABNORMAL LOW (ref 8.9–10.3)
Chloride: 104 mmol/L (ref 98–111)
Creatinine, Ser: 0.75 mg/dL (ref 0.44–1.00)
GFR calc Af Amer: >60 mL/min (ref >60)
GFR calc non Af Amer: >60 mL/min (ref >60)
Glucose, Bld: 98 mg/dL (ref 70–99)
Potassium: 3.9 mmol/L (ref 3.5–5.1)
Sodium: 137 mmol/L (ref 135–145)
Total Bilirubin: 0.6 mg/dL (ref 0.3–1.2)
Total Protein: 7.2 g/dL (ref 6.5–8.1)

## 2019-07-13 LAB — I-STAT BETA HCG BLOOD, ED (MC, WL, AP ONLY): I-stat hCG, quantitative: <5 m[IU]/mL (ref <5)

## 2019-07-13 LAB — CBC WITH DIFFERENTIAL/PLATELET
Abs Immature Granulocytes: 0.04 10*3/uL (ref 0.00–0.07)
Basophils Absolute: 0.1 10*3/uL (ref 0.0–0.1)
Basophils Relative: 1 %
Eosinophils Absolute: 0.1 10*3/uL (ref 0.0–0.5)
Eosinophils Relative: 1 %
HCT: 42.4 % (ref 36.0–46.0)
Hemoglobin: 13.1 g/dL (ref 12.0–15.0)
Immature Granulocytes: 0 %
Lymphocytes Relative: 28 %
Lymphs Abs: 3.3 10*3/uL (ref 0.7–4.0)
MCH: 27.1 pg (ref 26.0–34.0)
MCHC: 30.9 g/dL (ref 30.0–36.0)
MCV: 87.6 fL (ref 80.0–100.0)
Monocytes Absolute: 0.9 10*3/uL (ref 0.1–1.0)
Monocytes Relative: 8 %
Neutro Abs: 7.2 10*3/uL (ref 1.7–7.7)
Neutrophils Relative %: 62 %
Platelets: 364 10*3/uL (ref 150–400)
RBC: 4.84 MIL/uL (ref 3.87–5.11)
RDW: 16.6 % — ABNORMAL HIGH (ref 11.5–15.5)
WBC: 11.6 10*3/uL — ABNORMAL HIGH (ref 4.0–10.5)
nRBC: 0 % (ref 0.0–0.2)

## 2019-07-13 LAB — URINALYSIS, ROUTINE W REFLEX MICROSCOPIC
Bacteria, UA: NONE SEEN
Bilirubin Urine: NEGATIVE
Glucose, UA: NEGATIVE mg/dL
Hgb urine dipstick: NEGATIVE
Ketones, ur: NEGATIVE mg/dL
Nitrite: NEGATIVE
Protein, ur: NEGATIVE mg/dL
Specific Gravity, Urine: 1.016 (ref 1.005–1.030)
pH: 5 (ref 5.0–8.0)

## 2019-07-13 LAB — LIPASE, BLOOD: Lipase: 29 U/L (ref 11–51)

## 2019-07-13 LAB — WET PREP, GENITAL
Sperm: NONE SEEN
Yeast Wet Prep HPF POC: NONE SEEN

## 2019-07-13 MED ORDER — ONDANSETRON HCL 4 MG/2ML IJ SOLN
4.0000 mg | Freq: Once | INTRAMUSCULAR | Status: AC
  Administered 2019-07-13: 09:00:00 4 mg via INTRAVENOUS
  Filled 2019-07-13: qty 2

## 2019-07-13 MED ORDER — SODIUM CHLORIDE 0.9 % IV BOLUS
1000.0000 mL | Freq: Once | INTRAVENOUS | Status: AC
  Administered 2019-07-13: 1000 mL via INTRAVENOUS

## 2019-07-13 MED ORDER — ONDANSETRON 4 MG PO TBDP: 4.0000 mg | ORAL_TABLET | Freq: Three times a day (TID) | ORAL | 0 refills | Status: DC | PRN

## 2019-07-13 MED ORDER — ONDANSETRON HCL 4 MG/2ML IJ SOLN
4.0000 mg | Freq: Once | INTRAMUSCULAR | Status: AC
  Administered 2019-07-13: 4 mg via INTRAVENOUS
  Filled 2019-07-13: qty 2

## 2019-07-13 MED ORDER — MORPHINE SULFATE (PF) 4 MG/ML IV SOLN
4.0000 mg | Freq: Once | INTRAVENOUS | Status: AC
  Administered 2019-07-13: 09:00:00 4 mg via INTRAVENOUS
  Filled 2019-07-13: qty 1

## 2019-07-13 MED ORDER — METRONIDAZOLE 500 MG PO TABS
2000.0000 mg | ORAL_TABLET | Freq: Once | ORAL | Status: AC
  Administered 2019-07-13: 13:00:00 2000 mg via ORAL
  Filled 2019-07-13: qty 4

## 2019-07-13 MED ORDER — KETOROLAC TROMETHAMINE 30 MG/ML IJ SOLN
30.0000 mg | Freq: Once | INTRAMUSCULAR | Status: AC
  Administered 2019-07-13: 13:00:00 30 mg via INTRAVENOUS
  Filled 2019-07-13: qty 1

## 2019-07-13 MED ORDER — OXYCODONE-ACETAMINOPHEN 5-325 MG PO TABS: 2.0000 | ORAL_TABLET | ORAL | 0 refills | Status: DC | PRN

## 2019-07-13 MED ORDER — FENTANYL CITRATE (PF) 100 MCG/2ML IJ SOLN
100.0000 ug | Freq: Once | INTRAMUSCULAR | Status: AC
  Administered 2019-07-13: 10:00:00 100 ug via INTRAVENOUS
  Filled 2019-07-13: qty 2

## 2019-07-13 MED ORDER — IOHEXOL 300 MG/ML  SOLN
75.0000 mL | Freq: Once | INTRAMUSCULAR | Status: AC | PRN
  Administered 2019-07-13: 11:00:00 75 mL via INTRAVENOUS

## 2019-07-13 MED ORDER — CEFTRIAXONE SODIUM 250 MG IJ SOLR
250.0000 mg | Freq: Once | INTRAMUSCULAR | Status: AC
  Administered 2019-07-13: 13:00:00 250 mg via INTRAMUSCULAR
  Filled 2019-07-13: qty 250

## 2019-07-13 MED ORDER — AZITHROMYCIN 250 MG PO TABS
1000.0000 mg | ORAL_TABLET | Freq: Once | ORAL | Status: AC
  Administered 2019-07-13: 13:00:00 1000 mg via ORAL
  Filled 2019-07-13: qty 4

## 2019-07-13 MED ORDER — LIDOCAINE HCL (PF) 1 % IJ SOLN
INTRAMUSCULAR | Status: AC
  Administered 2019-07-13: 13:00:00 1 mL
  Filled 2019-07-13: qty 5

## 2019-07-13 MED ORDER — STERILE WATER FOR INJECTION IJ SOLN
INTRAMUSCULAR | Status: AC
  Administered 2019-07-13: 1 mL
  Filled 2019-07-13: qty 10

## 2019-07-13 NOTE — ED Notes (Signed)
Pt given water for fluid/PO challenge 

## 2019-07-13 NOTE — ED Notes (Signed)
Pt verbalized understanding of discharge instructions. Follow up care, prescriptions and pain management reviewed. Pt had no further questions. Pt ambulated independently to lobby.

## 2019-07-13 NOTE — ED Provider Notes (Signed)
[facility] DEPARTMENT Provider Note   CSN: [id number] Arrival date & time: 07/13/19  [id number]     History Chief Complaint  Patient presents with  . Abdominal Pain    Elizabeth Dean is a 38 y.o. female with PMH/o anemia, bronchitis, depression, GERD who presents for evaluation of left lower quadrant abdominal pain.  She reports that pain began about 4 AM this morning.  She describes it as a "sharp, cramping pain."  Patient states that she has not taken any medications for the pain.  She had some nausea but no vomiting.  She states she has had few episodes of diarrhea over the last several days.  She has not noted any fevers.  Patient states her LMP and just finished.  She is not currently sexually active.  She denies any chest pain, difficulty breathing, dysuria, hematuria, vaginal discharge, vaginal bleeding.  The history is provided by the patient.       Past Medical History:  Diagnosis Date  . Anemia   . Anxiety   . Asthma   . Bronchitis   . Depression   . GERD (gastroesophageal reflux disease)   . Panic attacks     Patient Active Problem List   Diagnosis Date Noted  . Pregnancy of unknown anatomic location 09/03/2015  . Healthcare maintenance 09/03/2015  . Hypertriglyceridemia 04/04/2014  . Asthma with acute exacerbation 04/04/2014  . Need for prophylactic vaccination and inoculation against influenza 04/04/2014  . Adjustment disorder with mixed anxiety and depressed mood 02/14/2014  . Muscle spasm 01/02/2014  . Major depression 01/02/2014  . Multiple falls 01/02/2014    Past Surgical History:  Procedure Laterality Date  . CHOLECYSTECTOMY       OB History    Gravida  6   Para  4   Term  4   Preterm      AB  2   Living  4     SAB  2   TAB      Ectopic      Multiple      Live Births  4           Family History  Problem Relation Age of Onset  . Heart disease Maternal Grandmother   . Hypertension Maternal  Grandmother     Social History   Tobacco Use  . Smoking status: Current Some Day Smoker    Packs/day: 1.00    Types: E-cigarettes  . Smokeless tobacco: Never Used  Substance Use Topics  . Alcohol use: Yes    Comment: socially  . Drug use: No    Home Medications Prior to Admission medications   Medication Sig Start Date End Date Taking? Authorizing Provider  fluticasone (FLONASE) 50 MCG/ACT nasal spray Place 2 sprays into both nostrils daily. 03/30/18   Fulp, Cammie, MD  ibuprofen (ADVIL) 600 MG tablet Take 1 tablet (600 mg total) by mouth every 6 (six) hours as needed. 02/06/19   Domenic Moras, PA-C  methocarbamol (ROBAXIN) 500 MG tablet Take 2 tablets (1,000 mg total) by mouth 4 (four) times daily. 05/08/18   Carlisle Cater, PA-C  ondansetron (ZOFRAN ODT) 4 MG disintegrating tablet Take 1 tablet (4 mg total) by mouth every 8 (eight) hours as needed for nausea or vomiting. 07/13/19   Volanda Napoleon, PA-C  oxyCODONE (ROXICODONE) 5 MG immediate release tablet Take 1 tablet (5 mg total) by mouth every 4 (four) hours as needed for up to 2 doses for severe pain. 09/08/18  Janeece Fitting, PA-C  oxyCODONE-acetaminophen (PERCOCET/ROXICET) 5-325 MG tablet Take 2 tablets by mouth every 4 (four) hours as needed for severe pain. 07/13/19   Volanda Napoleon, PA-C  sertraline (ZOLOFT) 50 MG tablet Take 1 tablet (50 mg total) by mouth daily. 03/30/18   Fulp, Ander Gaster, MD    Allergies    Hydrocodone and Codeine  Review of Systems   Review of Systems  Constitutional: Negative for fever.  Respiratory: Negative for cough and shortness of breath.   Cardiovascular: Negative for chest pain.  Gastrointestinal: Positive for abdominal pain, diarrhea, nausea and vomiting.  Genitourinary: Negative for dysuria and hematuria.  Neurological: Negative for headaches.  All other systems reviewed and are negative.   Physical Exam Updated Vital Signs BP 123/81   Pulse 90   Temp 98.4 F (36.9 C) (Oral)   Resp  20   Ht 4\' 9"  (1.448 m)   Wt 81.6 kg   LMP 07/05/2019 (Approximate)   SpO2 100%   BMI 38.95 kg/m   Physical Exam Vitals and nursing note reviewed. Exam conducted with a chaperone present.  Constitutional:      Appearance: Normal appearance. She is well-developed.  HENT:     Head: Normocephalic and atraumatic.  Eyes:     General: Lids are normal.     Conjunctiva/sclera: Conjunctivae normal.     Pupils: Pupils are equal, round, and reactive to light.  Cardiovascular:     Rate and Rhythm: Normal rate and regular rhythm.     Pulses: Normal pulses.     Heart sounds: Normal heart sounds. No murmur. No friction rub. No gallop.   Pulmonary:     Effort: Pulmonary effort is normal.     Breath sounds: Normal breath sounds.  Abdominal:     Palpations: Abdomen is soft. Abdomen is not rigid.     Tenderness: There is abdominal tenderness in the left lower quadrant. There is no right CVA tenderness, left CVA tenderness or guarding. Negative signs include McBurney's sign.     Comments: Abdomen is soft, nondistended.  Tenderness in the left lower quadrant.  No focal tenderness in his pain.  No rigidity, guarding.  No CVA tenderness noted bilaterally.  Genitourinary:    Cervix: No cervical motion tenderness.     Uterus: Normal.      Adnexa: Right adnexa normal and left adnexa normal.       Right: No mass or tenderness.         Left: No mass or tenderness.       Comments: The exam was performed with a chaperone present. Normal external female genitalia. No lesions, rash, or sores.  No CMT.  No adnexal mass or tenderness noted bilaterally. Musculoskeletal:        General: Normal range of motion.     Cervical back: Full passive range of motion without pain.  Skin:    General: Skin is warm and dry.     Capillary Refill: Capillary refill takes less than 2 seconds.  Neurological:     Mental Status: She is alert and oriented to person, place, and time.  Psychiatric:        Speech: Speech normal.       ED Results / Procedures / Treatments   Labs (all labs ordered are listed, but only abnormal results are displayed) Labs Reviewed  WET PREP, GENITAL - Abnormal; Notable for the following components:      Result Value   Trich, Wet Prep PRESENT (*)  Clue Cells Wet Prep HPF POC PRESENT (*)    WBC, Wet Prep HPF POC MANY (*)    All other components within normal limits  COMPREHENSIVE METABOLIC PANEL - Abnormal; Notable for the following components:   Calcium 8.8 (*)    All other components within normal limits  CBC WITH DIFFERENTIAL/PLATELET - Abnormal; Notable for the following components:   WBC 11.6 (*)    RDW 16.6 (*)    All other components within normal limits  URINALYSIS, ROUTINE W REFLEX MICROSCOPIC - Abnormal; Notable for the following components:   Leukocytes,Ua MODERATE (*)    All other components within normal limits  LIPASE, BLOOD  I-STAT BETA HCG BLOOD, ED ([facility], [facility], [facility] ONLY)  GC/CHLAMYDIA PROBE AMP ([facility]) NOT AT [facility]    EKG None  Radiology CT ABDOMEN PELVIS W CONTRAST  Result Date: 07/13/2019 CLINICAL DATA:  Left upper quadrant abdominal pain, nausea and vomiting since this morning. EXAM: CT ABDOMEN AND PELVIS WITH CONTRAST TECHNIQUE: Multidetector CT imaging of the abdomen and pelvis was performed using the standard protocol following bolus administration of intravenous contrast. CONTRAST:  53mL OMNIPAQUE IOHEXOL 300 MG/ML  SOLN COMPARISON:  None. FINDINGS: Lower chest: The lung bases are clear of acute process. No pleural effusion or pulmonary lesions. The heart is normal in size. No pericardial effusion. The distal esophagus and aorta are unremarkable. Hepatobiliary: No focal hepatic lesions or intrahepatic biliary dilatation. The gallbladder is surgically absent. Mild associated common bile duct dilatation. No intrahepatic biliary dilatation. The portal and hepatic veins are normal. Pancreas: No mass, inflammation or ductal dilatation. Spleen: Normal size.   No focal lesions. Adrenals/Urinary Tract: The adrenal glands are normal. Multiple bilateral renal calculi are noted. No obstructing ureteral calculi or bladder calculi. No worrisome renal lesions or evidence of pyelonephritis. The bladder appears normal. Stomach/Bowel: The stomach, duodenum, small bowel and colon are unremarkable. No acute inflammatory changes, mass lesions or obstructive findings. The terminal ileum is normal. The appendix is normal. Vascular/Lymphatic: The aorta is normal in caliber. No dissection. The branch vessels are patent. The major venous structures are patent. No mesenteric or retroperitoneal mass or adenopathy. Small scattered lymph nodes are noted. Reproductive: The uterus is retroverted. 3 cm fundal fibroid is noted. The endometrium is grossly normal. Both ovaries are unremarkable. There is a probable collapsing corpus luteum cyst associated with the left ovary. Parry adnexal and free pelvic fluid could be due to a ruptured cyst. Other: No inguinal mass or adenopathy. No inguinal hernia. No abdominal wall hernia. Musculoskeletal: The bony structures are unremarkable. IMPRESSION: 1. Bilateral renal calculi but no obstructing ureteral calculi or bladder calculi. 2. Status post cholecystectomy.  No unexpected biliary dilatation. 3. Periadnexal and free pelvic fluid possibly due to a ruptured ovarian cyst. 4. Uterine fibroids. Electronically Signed   By: Marijo Sanes M.D.   On: 07/13/2019 11:49    Procedures Procedures (including critical care time)  Medications Ordered in ED Medications  sodium chloride 0.9 % bolus 1,000 mL (0 mLs Intravenous Stopped 07/13/19 1155)  ondansetron (ZOFRAN) injection 4 mg (4 mg Intravenous Given 07/13/19 0900)  morphine 4 MG/ML injection 4 mg (4 mg Intravenous Given 07/13/19 0901)  ondansetron (ZOFRAN) injection 4 mg (4 mg Intravenous Given 07/13/19 0949)  fentaNYL (SUBLIMAZE) injection 100 mcg (100 mcg Intravenous Given 07/13/19 1011)  iohexol  (OMNIPAQUE) 300 MG/ML solution 75 mL (75 mLs Intravenous Contrast Given 07/13/19 1126)  ketorolac (TORADOL) 30 MG/ML injection 30 mg (30 mg Intravenous Given 07/13/19 1230)  cefTRIAXone (ROCEPHIN) injection 250 mg (250 mg Intramuscular Given 07/13/19 1234)  azithromycin (ZITHROMAX) tablet 1,000 mg (1,000 mg Oral Given 07/13/19 1234)  metroNIDAZOLE (FLAGYL) tablet 2,000 mg (2,000 mg Oral Given 07/13/19 1235)  sterile water (preservative free) injection (1 mL  Given 07/13/19 1234)  lidocaine (PF) (XYLOCAINE) 1 % injection (1 mL  Given 07/13/19 1234)    ED Course  I have reviewed the triage vital signs and the nursing notes.  Pertinent labs & imaging results that were available during my care of the patient were reviewed by me and considered in my medical decision making (see chart for details).    MDM Rules/Calculators/A&P                      38 y.o. F PMH/o anemia who presents for evaluation of left lower quadrant abdominal pain that began this morning.  Associated with some diarrhea.  No fevers, chest pain, difficulty breathing.  No urinary complaints.  Initially arrival, she is afebrile, nontoxic-appearing.  Vital signs are stable.  On exam, she has tenderness palpation of the left lower quadrant.  No CVA tenderness.  Consider infectious etiology versus ruptured ovarian cyst versus kidney stone.  Plan to check labs, urine.  I-STAT beta negative.  Lipase is unremarkable.  CMP shows no abnormalities BUN and creatinine.  CBC with slight leukocytosis of 11.6.  Pelvic shows no CMT that would be concerning for PID.  No adnexal mass or tenderness noted.  Wet prep is positive for trichomoniasis, clue cells.  CT scan is negative for appendicitis.  There is mention of bilateral renal calculi but no obstructing ureteral or bladder stone.  There is some periadnexal and free pelvic fluid possibly due to left ruptured ovarian cyst.  Discussed results with patient.  She appears much more comfortable  and states pain is improved.  At this time, given lack of adnexal tenderness noted on pelvic exam as well as CT findings, do not feel that this is suspicious for ovarian torsion.  I discussed trichomonas result with patient.  She would like to be treated for gonorrhea and chlamydia also.  Patient with no known drug allergies.  Will plan to PO challenge.   Re-evaluation. Patient is resting comfortably.  She reports improvement in pain.  She is hemodynamically stable.  Repeat abdominal exam is improved.  At this time, her exam is not concerning for ovarian torsion.  Indication that she needs further ultrasound imaging.  Will give patient short course of pain medication for acute pain.  She has history of allergy to hydrocodone.  She has tolerated oxycodone in the past.  Instructed patient to follow-up with OB/GYN. At this time, patient exhibits no emergent life-threatening condition that require further evaluation in ED or admission. Patient had ample opportunity for questions and discussion. All patient's questions were answered with full understanding. Strict return precautions discussed. Patient expresses understanding and agreement to plan.   Portions of this note were generated with [employer]. Dictation errors may occur despite best attempts at proofreading.   Final Clinical Impression(s) / ED Diagnoses Final diagnoses:  Cyst of left ovary  Trichomoniasis  Renal stone    Rx / DC Orders ED Discharge Orders         Ordered    oxyCODONE-acetaminophen (PERCOCET/ROXICET) 5-325 MG tablet  Every 4 hours PRN     07/13/19 1319    ondansetron (ZOFRAN ODT) 4 MG disintegrating tablet  Every 8 hours PRN     07/13/19  1319           Desma Mcgregor 07/13/19 1527    Drenda Freeze, MD 07/14/19 [phone]

## 2019-07-13 NOTE — Discharge Instructions (Signed)
You can take Tylenol or Ibuprofen as directed for pain. You can alternate Tylenol and Ibuprofen every 4 hours. If you take Tylenol at 1pm, then you can take Ibuprofen at 5pm. Then you can take Tylenol again at 9pm.   Take pain medications as directed for break through pain. Do not drive or operate machinery while taking this medication.   Take zofran for nausea.   As we discussed, you will need to follow up with an OB/GYN. I have provided you a referral.   You have been treated today for an STD.   The test results with take 2-3 days to return. If there is an abnormal result, you will be notified. If you do not hear anything, that means the results were negative. You can also log on MyChart to see the results.   Your sexual partner needs to be treated too. Do not have sexual intercourse for the next 7 days and after your partner has been treated.   Return to the Emergency Department for any fever, abdominal pain, difficulty breathing, nausea/vomiting or any other worsening or concerning symptoms.

## 2019-07-13 NOTE — ED Triage Notes (Signed)
Pt here for evaluation of LLQ abdominal pain with nausea and diarrhea. Pain woke pt out of sleep this morning. No intervention prior to arrival.

## 2019-07-14 LAB — GC/CHLAMYDIA PROBE AMP (~~LOC~~) NOT AT ARMC
Chlamydia: POSITIVE — AB
Neisseria Gonorrhea: NEGATIVE

## 2019-09-12 ENCOUNTER — Other Ambulatory Visit: Payer: Self-pay

## 2019-09-12 ENCOUNTER — Emergency Department (HOSPITAL_COMMUNITY)
Admission: EM | Admit: 2019-09-12 | Discharge: 2019-09-12 | Disposition: A | Discharge status: Home / Self Care | Payer: Self-pay | Attending: Emergency Medicine | Admitting: Emergency Medicine

## 2019-09-12 ENCOUNTER — Encounter (HOSPITAL_COMMUNITY): Payer: Self-pay | Admitting: Emergency Medicine

## 2019-09-12 ENCOUNTER — Emergency Department (HOSPITAL_COMMUNITY): Payer: Self-pay

## 2019-09-12 DIAGNOSIS — J45909 Unspecified asthma, uncomplicated: Secondary | ICD-10-CM | POA: Insufficient documentation

## 2019-09-12 DIAGNOSIS — Z79899 Other long term (current) drug therapy: Secondary | ICD-10-CM | POA: Insufficient documentation

## 2019-09-12 DIAGNOSIS — R102 Pelvic and perineal pain: Secondary | ICD-10-CM

## 2019-09-12 DIAGNOSIS — F17290 Nicotine dependence, other tobacco product, uncomplicated: Secondary | ICD-10-CM | POA: Insufficient documentation

## 2019-09-12 DIAGNOSIS — Z9049 Acquired absence of other specified parts of digestive tract: Secondary | ICD-10-CM | POA: Insufficient documentation

## 2019-09-12 DIAGNOSIS — N83201 Unspecified ovarian cyst, right side: Secondary | ICD-10-CM | POA: Insufficient documentation

## 2019-09-12 LAB — URINALYSIS, ROUTINE W REFLEX MICROSCOPIC
Bacteria, UA: NONE SEEN
Bilirubin Urine: NEGATIVE
Glucose, UA: NEGATIVE mg/dL
Ketones, ur: NEGATIVE mg/dL
Nitrite: NEGATIVE
Protein, ur: NEGATIVE mg/dL
Specific Gravity, Urine: 1.02 (ref 1.005–1.030)
pH: 6 (ref 5.0–8.0)

## 2019-09-12 LAB — CBC WITH DIFFERENTIAL/PLATELET
Abs Immature Granulocytes: 0.05 10*3/uL (ref 0.00–0.07)
Basophils Absolute: 0.1 10*3/uL (ref 0.0–0.1)
Basophils Relative: 0 %
Eosinophils Absolute: 0.1 10*3/uL (ref 0.0–0.5)
Eosinophils Relative: 1 %
HCT: 41.1 % (ref 36.0–46.0)
Hemoglobin: 12.4 g/dL (ref 12.0–15.0)
Immature Granulocytes: 0 %
Lymphocytes Relative: 31 %
Lymphs Abs: 3.6 10*3/uL (ref 0.7–4.0)
MCH: 26.4 pg (ref 26.0–34.0)
MCHC: 30.2 g/dL (ref 30.0–36.0)
MCV: 87.6 fL (ref 80.0–100.0)
Monocytes Absolute: 0.8 10*3/uL (ref 0.1–1.0)
Monocytes Relative: 7 %
Neutro Abs: 6.9 10*3/uL (ref 1.7–7.7)
Neutrophils Relative %: 61 %
Platelets: 372 10*3/uL (ref 150–400)
RBC: 4.69 MIL/uL (ref 3.87–5.11)
RDW: 16.3 % — ABNORMAL HIGH (ref 11.5–15.5)
WBC: 11.5 10*3/uL — ABNORMAL HIGH (ref 4.0–10.5)
nRBC: 0 % (ref 0.0–0.2)

## 2019-09-12 LAB — HIV ANTIBODY (ROUTINE TESTING W REFLEX): HIV Screen 4th Generation wRfx: NONREACTIVE

## 2019-09-12 LAB — COMPREHENSIVE METABOLIC PANEL
ALT: 25 U/L (ref 0–44)
AST: 19 U/L (ref 15–41)
Albumin: 3.7 g/dL (ref 3.5–5.0)
Alkaline Phosphatase: 66 U/L (ref 38–126)
Anion gap: 12 (ref 5–15)
BUN: 9 mg/dL (ref 6–20)
CO2: 25 mmol/L (ref 22–32)
Calcium: 8.7 mg/dL — ABNORMAL LOW (ref 8.9–10.3)
Chloride: 102 mmol/L (ref 98–111)
Creatinine, Ser: 0.75 mg/dL (ref 0.44–1.00)
GFR calc Af Amer: >60 mL/min (ref >60)
GFR calc non Af Amer: >60 mL/min (ref >60)
Glucose, Bld: 87 mg/dL (ref 70–99)
Potassium: 3.6 mmol/L (ref 3.5–5.1)
Sodium: 139 mmol/L (ref 135–145)
Total Bilirubin: 0.6 mg/dL (ref 0.3–1.2)
Total Protein: 7.4 g/dL (ref 6.5–8.1)

## 2019-09-12 LAB — WET PREP, GENITAL
Clue Cells Wet Prep HPF POC: NONE SEEN
Sperm: NONE SEEN
Trich, Wet Prep: NONE SEEN
Yeast Wet Prep HPF POC: NONE SEEN

## 2019-09-12 LAB — I-STAT BETA HCG BLOOD, ED (MC, WL, AP ONLY): I-stat hCG, quantitative: <5 m[IU]/mL (ref <5)

## 2019-09-12 MED ORDER — ONDANSETRON 4 MG PO TBDP
4.0000 mg | ORAL_TABLET | Freq: Once | ORAL | Status: AC
  Administered 2019-09-12: 4 mg via ORAL
  Filled 2019-09-12: qty 1

## 2019-09-12 MED ORDER — CEFTRIAXONE SODIUM 500 MG IJ SOLR
500.0000 mg | Freq: Once | INTRAMUSCULAR | Status: AC
  Administered 2019-09-12: 500 mg via INTRAMUSCULAR
  Filled 2019-09-12: qty 500

## 2019-09-12 MED ORDER — KETOROLAC TROMETHAMINE 30 MG/ML IJ SOLN: 30.0000 mg | Freq: Once | INTRAMUSCULAR | Status: DC

## 2019-09-12 MED ORDER — ONDANSETRON HCL 4 MG/2ML IJ SOLN: 4.0000 mg | Freq: Once | INTRAMUSCULAR | Status: DC

## 2019-09-12 MED ORDER — KETOROLAC TROMETHAMINE 15 MG/ML IJ SOLN
15.0000 mg | Freq: Once | INTRAMUSCULAR | Status: AC
  Administered 2019-09-12: 15 mg via INTRAMUSCULAR
  Filled 2019-09-12: qty 1

## 2019-09-12 MED ORDER — AZITHROMYCIN 250 MG PO TABS
1000.0000 mg | ORAL_TABLET | Freq: Once | ORAL | Status: AC
  Administered 2019-09-12: 1000 mg via ORAL
  Filled 2019-09-12: qty 4

## 2019-09-12 NOTE — ED Notes (Signed)
Pt is a hard  IV stick . PA aware.

## 2019-09-12 NOTE — ED Triage Notes (Signed)
Pt reports pelvic pain since yesterdayl. Pt reports hx of left ruptured ovarian cyst. Pt reports pain is worse. Pt c/o back pain/nausea/ unknown if pregnant. Denies vaginal bleeding/discharge.

## 2019-09-12 NOTE — ED Provider Notes (Signed)
[facility] EMERGENCY DEPARTMENT Provider Note   CSN: [id number] Arrival date & time: 09/12/19  1356     History Chief Complaint  Patient presents with  . Pelvic Pain    Elizabeth Dean is a 39 y.o. female past medical history significant for anemia, anxiety, GERD presents to emergency department today with chief complaint intermittient pelvic pain x2 days.  She describes the pain as cramping sensation. She was at work today when the pain worsened and she had sudden onset of nausea without emesis. She states she thought her period was starting a few days ago when she had light vaginal spotting. The spotting lasted 1 day and then she had 1 day or heavy bleeding, no bleeding since then. She states otherwise her LMP was in December. She is sexually active with one female partner.  Chart review shoes patient was see in the ED in 06/2019 for abdominal pain and found to have probable left ovarian cyst with free pelvic fluid to indicate possible ruptured cyst.  She also tested positive for trichomonas and was treated for that as well as gonorrhea and chlamydia.   Past Medical History:  Diagnosis Date  . Anemia   . Anxiety   . Asthma   . Bronchitis   . Depression   . GERD (gastroesophageal reflux disease)   . Panic attacks     Patient Active Problem List   Diagnosis Date Noted  . Pregnancy of unknown anatomic location 09/03/2015  . Healthcare maintenance 09/03/2015  . Hypertriglyceridemia 04/04/2014  . Asthma with acute exacerbation 04/04/2014  . Need for prophylactic vaccination and inoculation against influenza 04/04/2014  . Adjustment disorder with mixed anxiety and depressed mood 02/14/2014  . Muscle spasm 01/02/2014  . Major depression 01/02/2014  . Multiple falls 01/02/2014    Past Surgical History:  Procedure Laterality Date  . CHOLECYSTECTOMY       OB History    Gravida  6   Para  4   Term  4   Preterm      AB  2   Living  4     SAB  2   TAB      Ectopic      Multiple      Live Births  4           Family History  Problem Relation Age of Onset  . Heart disease Maternal Grandmother   . Hypertension Maternal Grandmother     Social History   Tobacco Use  . Smoking status: Current Some Day Smoker    Packs/day: 1.00    Types: E-cigarettes  . Smokeless tobacco: Never Used  Substance Use Topics  . Alcohol use: Yes    Comment: socially  . Drug use: No    Home Medications Prior to Admission medications   Medication Sig Start Date End Date Taking? Authorizing Provider  fluticasone (FLONASE) 50 MCG/ACT nasal spray Place 2 sprays into both nostrils daily. 03/30/18   Fulp, Cammie, MD  ibuprofen (ADVIL) 600 MG tablet Take 1 tablet (600 mg total) by mouth every 6 (six) hours as needed. 02/06/19   Domenic Moras, PA-C  methocarbamol (ROBAXIN) 500 MG tablet Take 2 tablets (1,000 mg total) by mouth 4 (four) times daily. 05/08/18   Carlisle Cater, PA-C  ondansetron (ZOFRAN ODT) 4 MG disintegrating tablet Take 1 tablet (4 mg total) by mouth every 8 (eight) hours as needed for nausea or vomiting. 07/13/19   Volanda Napoleon, PA-C  oxyCODONE (ROXICODONE)  5 MG immediate release tablet Take 1 tablet (5 mg total) by mouth every 4 (four) hours as needed for up to 2 doses for severe pain. 09/08/18   Janeece Fitting, PA-C  oxyCODONE-acetaminophen (PERCOCET/ROXICET) 5-325 MG tablet Take 2 tablets by mouth every 4 (four) hours as needed for severe pain. 07/13/19   Volanda Napoleon, PA-C  sertraline (ZOLOFT) 50 MG tablet Take 1 tablet (50 mg total) by mouth daily. 03/30/18   Fulp, Cammie, MD    Allergies    Hydrocodone and Codeine  Review of Systems   Review of Systems All other systems are reviewed and are negative for acute change except as noted in the HPI.  Physical Exam Updated Vital Signs BP (!) 160/99 (BP Location: Right Arm)   Pulse 79   Temp 98 F (36.7 C) (Oral)   Resp 20   Ht 4\' 9"  (1.448 m)   Wt 81.6 kg   LMP  09/10/2019   SpO2 100%   BMI 38.95 kg/m   Physical Exam Vitals and nursing note reviewed.  Constitutional:      General: She is not in acute distress.    Appearance: She is not ill-appearing.  HENT:     Head: Normocephalic and atraumatic.     Right Ear: Tympanic membrane and external ear normal.     Left Ear: Tympanic membrane and external ear normal.     Nose: Nose normal.     Mouth/Throat:     Mouth: Mucous membranes are moist.     Pharynx: Oropharynx is clear.  Eyes:     General: No scleral icterus.       Right eye: No discharge.        Left eye: No discharge.     Extraocular Movements: Extraocular movements intact.     Conjunctiva/sclera: Conjunctivae normal.     Pupils: Pupils are equal, round, and reactive to light.  Neck:     Vascular: No JVD.  Cardiovascular:     Rate and Rhythm: Normal rate and regular rhythm.     Pulses: Normal pulses.          Radial pulses are 2+ on the right side and 2+ on the left side.     Heart sounds: Normal heart sounds.  Pulmonary:     Comments: Lungs clear to auscultation in all fields. Symmetric chest rise. No wheezing, rales, or rhonchi. Abdominal:     Comments: Abdomen is soft, non-distended. No abdominal tenderness.   No rigidity, no guarding. No peritoneal signs.  Genitourinary:    Comments: Normal external genitalia. No pain with speculum insertion. Closed cervical os with normal appearance - no rash or lesions. No significant discharge or bleeding noted from cervix or in vaginal vault. On bimanual examination no adnexal tenderness or cervical motion tenderness. Chaperone Dacia NT present during exam.  Musculoskeletal:        General: Normal range of motion.     Cervical back: Normal range of motion.  Skin:    General: Skin is warm and dry.     Capillary Refill: Capillary refill takes less than 2 seconds.  Neurological:     Mental Status: She is oriented to person, place, and time.     GCS: GCS eye subscore is 4. GCS verbal  subscore is 5. GCS motor subscore is 6.     Comments: Fluent speech, no facial droop.  Psychiatric:        Behavior: Behavior normal.       ED Results /  Procedures / Treatments   Labs (all labs ordered are listed, but only abnormal results are displayed) Labs Reviewed  WET PREP, GENITAL - Abnormal; Notable for the following components:      Result Value   WBC, Wet Prep HPF POC MODERATE (*)    All other components within normal limits  COMPREHENSIVE METABOLIC PANEL - Abnormal; Notable for the following components:   Calcium 8.7 (*)    All other components within normal limits  CBC WITH DIFFERENTIAL/PLATELET - Abnormal; Notable for the following components:   WBC 11.5 (*)    RDW 16.3 (*)    All other components within normal limits  HIV ANTIBODY (ROUTINE TESTING W REFLEX)  URINALYSIS, ROUTINE W REFLEX MICROSCOPIC  RPR  I-STAT BETA HCG BLOOD, ED ([facility], [facility], [facility] ONLY)  GC/CHLAMYDIA PROBE AMP ([facility] [facility]) NOT AT [facility]    EKG None  Radiology US PELVIC COMPLETE W TRANSVAGINAL AND TORSION R/O  Result Date: 09/12/2019 CLINICAL DATA:  Initial evaluation for acute pelvic pain for 3-4 days. EXAM: TRANSABDOMINAL AND TRANSVAGINAL ULTRASOUND OF PELVIS DOPPLER ULTRASOUND OF OVARIES TECHNIQUE: Both transabdominal and transvaginal ultrasound examinations of the pelvis were performed. Transabdominal technique was performed for global imaging of the pelvis including uterus, ovaries, adnexal regions, and pelvic cul-de-sac. It was necessary to proceed with endovaginal exam following the transabdominal exam to visualize the uterus, endometrium, and ovaries. Color and duplex Doppler ultrasound was utilized to evaluate blood flow to the ovaries. COMPARISON:  Prior CT from 07/13/2019 FINDINGS: Uterus Measurements: 8.3 x 5.6 x 6.3 cm = volume: 153.7 mL. Multiple scattered fibroid seen without the uterus. Largest of these is intramural in location and measures 3.0 x 3.1 x 3.1 cm and is positioned at the anterior  uterine body/fundus. Endometrium Thickness: 5 mm.  No focal abnormality visualized. Right ovary Measurements: 4.0 x 3.2 x 3.5 cm = volume: 24.1 mL. 3.7 x 2.5 x 2.7 cm simple cyst. No internal complexity, vascularity, or solid component. Left ovary Measurements: 3.3 x 1.5 x 2.9 cm = volume: 7.2 mL. Normal appearance/no adnexal mass. Pulsed Doppler evaluation of both ovaries demonstrates normal low-resistance arterial and venous waveforms. Other findings Trace free fluid within the pelvis. IMPRESSION: 1. 3.7 cm simple right ovarian cyst. This has benign characteristics and is a common finding in premenopausal females. No imaging follow up is required. This follows consensus guidelines: Simple Adnexal Cysts: SRU Consensus Conference Update on Follow-up and Reporting. Radiology 2019; [id number]. 2. Fibroid uterus as detailed above. 3. No other acute abnormality within the pelvis. No evidence for torsion. Electronically Signed   By: Jeannine Boga M.D.   On: 09/12/2019 19:15    Procedures Procedures (including critical care time)  Medications Ordered in ED Medications  cefTRIAXone (ROCEPHIN) injection 500 mg (has no administration in time range)  azithromycin (ZITHROMAX) tablet 1,000 mg (has no administration in time range)  ondansetron (ZOFRAN-ODT) disintegrating tablet 4 mg (4 mg Oral Given 09/12/19 1809)  ketorolac (TORADOL) 15 MG/ML injection 15 mg (15 mg Intramuscular Given 09/12/19 1809)    ED Course  I have reviewed the triage vital signs and the nursing notes.  Pertinent labs & imaging results that were available during my care of the patient were reviewed by me and considered in my medical decision making (see chart for details).    MDM Rules/Calculators/A&P                       Patient seen and examined. Patient presents awake, alert, hemodynamically  stable, afebrile, non toxic.  On exam patient has pain to lower abdomen, more pelvic pain, no real tenderness to right and left upper  and lower quadrants. No peritoneal signs, no CVA tenderness. Also with unknown exact LMP. DDX includes pregnancy, ovarian cyst, ovarian torsion, uterine fibroids, feel less likely to be kidney stone, diverticulitis, appendicitis, small bowel obstruction.  CBC with nonspecific leukocytosis of 11.7, no anemia, hemoglobin stable at 12.4.  CMP without severe electrolyte derangement, no renal insufficiency, normal liver enzymes.  Pregnancy test is negative.  Pelvic exam performed with chaperone, no bleeding or discharge seen in vaginal vault.  Wet prep is negative for yeast, trichomoniasis, clue cells, does have moderate WBC.  Pelvic ultrasound shows uterine fibroids and 3.7 cm simple right ovarian cyst.  Patient given IM Toradol and p.o. Zofran.  On reassessment she reports pain has resolved and she is tolerating p.o. intake.  Serial abdominal exams have been benign. Pt has been treated prophylactically with azithromycin and Rocephin due to pts history, pelvic exam, and wet prep with increased WBCs. Pt is not concerning for PID because hemodynamically stable and no cervical motion tenderness on pelvic exam. Pt understands that they have GC/Chlamydia cultures pending and that they will need to inform all sexual partners if results return positive.  Patient care transferred to L. Layden PA-C at the end of my shift pending UA. Patient presentation, ED course, and plan of care discussed with review of all pertinent labs and imaging. Please see her note for further details regarding further ED course and disposition.  Anticipate discharge home with GYN follow-up.   Portions of this note were generated with [employer]. Dictation errors may occur despite best attempts at proofreading.  Final Clinical Impression(s) / ED Diagnoses Final diagnoses:  Pelvic pain  Cyst of right ovary    Rx / DC Orders ED Discharge Orders    None       Flint Melter 09/12/19 2008    Long, Joshua  G, MD 09/13/19 [phone]

## 2019-09-12 NOTE — Discharge Instructions (Addendum)
You have been seen today for pelvic pain. Please read and follow all provided instructions. Return to the emergency room for worsening condition or new concerning symptoms.    Pregnancy test is negative today. Your ultrasound shows you have uterine fibroids and a simple right ovarian cyst.  1. Medications:  No new medications prescribed today. Recommend you take ibuprofen for pain, please take as directed on the bottle.  Continue usual home medications Take medications as prescribed. Please review all of the medicines and only take them if you do not have an allergy to them.   2. Treatment: rest, drink plenty of fluids  3. Follow Up:  Please follow up with primary care provider by scheduling an appointment as soon as possible for a visit  -Also recommend you follow-up with a gynecologist.  I have given you information for the oncology group here in [place].  You can call to schedule a follow-up appointment.   It is also a possibility that you have an allergic reaction to any of the medicines that you have been prescribed - Everybody reacts differently to medications and while MOST people have no trouble with most medicines, you may have a reaction such as nausea, vomiting, rash, swelling, shortness of breath. If this is the case, please stop taking the medicine immediately and contact your physician.  ?  You have a urine culture pending.  If it does grow out bacteria and you need to be started on antibiotic, will be notified.  Otherwise, you can assume it was normal.

## 2019-09-12 NOTE — ED Provider Notes (Signed)
Care assumed from [facility], PA-C at shift change with UA pending.   In brief, this patient is a 39 y.o. F who presents with  intermittent pelvic pain x3 days.  She describes a cramping sensation.  She has had some vaginal spotting that lasted 1 day but no heavy bleeding.  Otherwise LMP was in December.  Please see note from previous provider for full history/physical exam.   Physical Exam  BP (!) 155/77   Pulse 79   Temp 98 F (36.7 C) (Oral)   Resp 19   Ht 4\' 9"  (1.448 m)   Wt 81.6 kg   LMP 09/10/2019   SpO2 99%   BMI 38.95 kg/m   Physical Exam  ED Course/Procedures     Procedures  MDM    PLAN: Patient pending UA.  Patient was discharged home after urinating.  MDM: UA shows moderate hemoglobin, small amount of leukocytes.  She does have squamous epithelium so question contaminant.  I discussed results with patient.  She is not having any dysuria or hematuria.  We will hold off on treatment given that she is asymptomatic.  Urine sent for culture. At this time, patient exhibits no emergent life-threatening condition that require further evaluation in ED or admission. Patient had ample opportunity for questions and discussion. All patient's questions were answered with full understanding. Strict return precautions discussed. Patient expresses understanding and agreement to plan.   1. Cyst of right ovary   2. Pelvic pain    Portions of this note were generated with Dragon dictation software. Dictation errors may occur despite best attempts at proofreading.   Desma Mcgregor 09/12/19 2117    Dorie Rank, MD 09/13/19 [phone]

## 2019-09-13 LAB — URINE CULTURE

## 2019-09-13 LAB — RPR: RPR Ser Ql: NONREACTIVE

## 2019-09-14 LAB — GC/CHLAMYDIA PROBE AMP (~~LOC~~) NOT AT ARMC
Chlamydia: NEGATIVE
Neisseria Gonorrhea: NEGATIVE

## 2019-12-30 ENCOUNTER — Telehealth (INDEPENDENT_AMBULATORY_CARE_PROVIDER_SITE_OTHER): Payer: Self-pay | Admitting: Obstetrics and Gynecology

## 2019-12-30 DIAGNOSIS — Z3200 Encounter for pregnancy test, result unknown: Secondary | ICD-10-CM

## 2019-12-30 NOTE — Telephone Encounter (Signed)
Patient called and reports she may be pregnant. She reports her face is shiny, nauseous, dizzy, and eating non stop. She has taken 2 pregnancy tests that are negative.   She reports her Cycle is late by 4 days. She reports she is not taking PNV. She is not having any bleeding.   Advised patient to take another pregnancy test in a week or come to the office next week during office hours to leave a urine sample.   Patient concerned if she is pregnant that she may miscarry. Discussed only time will tell and there is nothing we can do to stop her body from miscarrying. Enc patient to take care of herself.   Patient to call back with questions or concerns as needed.

## 2019-12-30 NOTE — Telephone Encounter (Signed)
Patient took two pregnant test at home both were negative. She is having symptoms of being pregnant. She would like to speak to a nurse.

## 2020-01-04 ENCOUNTER — Ambulatory Visit (INDEPENDENT_AMBULATORY_CARE_PROVIDER_SITE_OTHER): Payer: Self-pay

## 2020-01-04 ENCOUNTER — Other Ambulatory Visit: Payer: Self-pay

## 2020-01-04 DIAGNOSIS — Z3202 Encounter for pregnancy test, result negative: Secondary | ICD-10-CM

## 2020-01-04 LAB — POCT PREGNANCY, URINE: Preg Test, Ur: NEGATIVE

## 2020-01-04 NOTE — Progress Notes (Signed)
Pt here today for pregnancy test.  Resulted negative.  Pt states that she was advised to come in two weeks to test since her last pregnancy test was negative.  I advised pt to continue to test at home weekly if no period and to f/u with a provider if she continues to go a couple months without period.  Pt verbalized understanding.   Frances Nickels  01/04/20

## 2020-01-05 NOTE — Progress Notes (Signed)
I have reviewed this chart and agree with the RN/CMA assessment and management.    K. Meryl Bastian Andreoli, M.D. Attending [facility] (Faculty Practice)   

## 2020-02-03 ENCOUNTER — Encounter (HOSPITAL_COMMUNITY): Payer: Self-pay | Admitting: Emergency Medicine

## 2020-02-03 ENCOUNTER — Emergency Department (HOSPITAL_COMMUNITY)
Admission: EM | Admit: 2020-02-03 | Discharge: 2020-02-03 | Disposition: A | Discharge status: Home / Self Care | Payer: Self-pay | Attending: Emergency Medicine | Admitting: Emergency Medicine

## 2020-02-03 DIAGNOSIS — F17290 Nicotine dependence, other tobacco product, uncomplicated: Secondary | ICD-10-CM | POA: Insufficient documentation

## 2020-02-03 DIAGNOSIS — R197 Diarrhea, unspecified: Secondary | ICD-10-CM | POA: Insufficient documentation

## 2020-02-03 DIAGNOSIS — Z7951 Long term (current) use of inhaled steroids: Secondary | ICD-10-CM | POA: Insufficient documentation

## 2020-02-03 DIAGNOSIS — R112 Nausea with vomiting, unspecified: Secondary | ICD-10-CM | POA: Insufficient documentation

## 2020-02-03 DIAGNOSIS — J45901 Unspecified asthma with (acute) exacerbation: Secondary | ICD-10-CM | POA: Insufficient documentation

## 2020-02-03 LAB — URINALYSIS, ROUTINE W REFLEX MICROSCOPIC
Bacteria, UA: NONE SEEN
Bilirubin Urine: NEGATIVE
Glucose, UA: NEGATIVE mg/dL
Ketones, ur: NEGATIVE mg/dL
Leukocytes,Ua: NEGATIVE
Nitrite: NEGATIVE
Protein, ur: NEGATIVE mg/dL
Specific Gravity, Urine: 1.016 (ref 1.005–1.030)
pH: 6 (ref 5.0–8.0)

## 2020-02-03 LAB — CBC
HCT: 39 % (ref 36.0–46.0)
Hemoglobin: 11.5 g/dL — ABNORMAL LOW (ref 12.0–15.0)
MCH: 25.2 pg — ABNORMAL LOW (ref 26.0–34.0)
MCHC: 29.5 g/dL — ABNORMAL LOW (ref 30.0–36.0)
MCV: 85.5 fL (ref 80.0–100.0)
Platelets: 414 10*3/uL — ABNORMAL HIGH (ref 150–400)
RBC: 4.56 MIL/uL (ref 3.87–5.11)
RDW: 17.5 % — ABNORMAL HIGH (ref 11.5–15.5)
WBC: 21.6 10*3/uL — ABNORMAL HIGH (ref 4.0–10.5)
nRBC: 0 % (ref 0.0–0.2)

## 2020-02-03 LAB — COMPREHENSIVE METABOLIC PANEL
ALT: 35 U/L (ref 0–44)
AST: 32 U/L (ref 15–41)
Albumin: 3.7 g/dL (ref 3.5–5.0)
Alkaline Phosphatase: 64 U/L (ref 38–126)
Anion gap: 12 (ref 5–15)
BUN: 10 mg/dL (ref 6–20)
CO2: 20 mmol/L — ABNORMAL LOW (ref 22–32)
Calcium: 8.6 mg/dL — ABNORMAL LOW (ref 8.9–10.3)
Chloride: 108 mmol/L (ref 98–111)
Creatinine, Ser: 0.7 mg/dL (ref 0.44–1.00)
GFR calc Af Amer: >60 mL/min (ref >60)
GFR calc non Af Amer: >60 mL/min (ref >60)
Glucose, Bld: 93 mg/dL (ref 70–99)
Potassium: 3.8 mmol/L (ref 3.5–5.1)
Sodium: 140 mmol/L (ref 135–145)
Total Bilirubin: 0.6 mg/dL (ref 0.3–1.2)
Total Protein: 7.1 g/dL (ref 6.5–8.1)

## 2020-02-03 LAB — MAGNESIUM: Magnesium: 2 mg/dL (ref 1.7–2.4)

## 2020-02-03 LAB — I-STAT BETA HCG BLOOD, ED (MC, WL, AP ONLY): I-stat hCG, quantitative: <5 m[IU]/mL (ref <5)

## 2020-02-03 LAB — LIPASE, BLOOD: Lipase: 28 U/L (ref 11–51)

## 2020-02-03 MED ORDER — SODIUM CHLORIDE 0.9 % IV BOLUS
1000.0000 mL | Freq: Once | INTRAVENOUS | Status: AC
  Administered 2020-02-03: 1000 mL via INTRAVENOUS

## 2020-02-03 MED ORDER — SODIUM CHLORIDE 0.9% FLUSH: 3.0000 mL | Freq: Once | INTRAVENOUS | Status: DC

## 2020-02-03 MED ORDER — ONDANSETRON HCL 4 MG/2ML IJ SOLN
4.0000 mg | Freq: Once | INTRAMUSCULAR | Status: AC
  Administered 2020-02-03: 4 mg via INTRAVENOUS
  Filled 2020-02-03: qty 2

## 2020-02-03 NOTE — ED Triage Notes (Signed)
Pt states she smoked some weed yesterday with someone and thinks it may have been laced with something, having n/v/d that started last night. Pt stating she wants fluids and to have her stomach pumped.

## 2020-02-03 NOTE — ED Provider Notes (Signed)
[facility] EMERGENCY DEPARTMENT Provider Note   CSN: [id number] Arrival date & time: 02/03/20  0719     History Chief Complaint  Patient presents with  . Emesis    Elizabeth Dean is a 39 y.o. female.  The history is provided by the patient. No language interpreter was used.  Emesis    39 year old female with history of GERD, anemia, anxiety, presenting for evaluation of nausea vomiting diarrhea.  Patient report last night she was hanging out with a friend and was using marijuana.  She report after 30 minutes into using marijuana she became violently sick.  States she felt very nauseous, shaky, and has been vomited persistently since.  She is now vomitus bilious content.  She endorsed loose stools as well.  She feels shaky and anxious.  She does not complain of fever chills and denies any abdominal pain or back pain or dysuria.  She believes her symptom is from marijuana use.  She does use marijuana recreationally, but denies ever having similar symptoms like this.  She denies any sick contact.  She has had prior cholecystectomy.  She denies any recent alcohol use.  She started her menses today.  Past Medical History:  Diagnosis Date  . Anemia   . Anxiety   . Asthma   . Bronchitis   . Depression   . GERD (gastroesophageal reflux disease)   . Panic attacks     Patient Active Problem List   Diagnosis Date Noted  . Pregnancy of unknown anatomic location 09/03/2015  . Healthcare maintenance 09/03/2015  . Hypertriglyceridemia 04/04/2014  . Asthma with acute exacerbation 04/04/2014  . Need for prophylactic vaccination and inoculation against influenza 04/04/2014  . Adjustment disorder with mixed anxiety and depressed mood 02/14/2014  . Muscle spasm 01/02/2014  . Major depression 01/02/2014  . Multiple falls 01/02/2014    Past Surgical History:  Procedure Laterality Date  . CHOLECYSTECTOMY       OB History    Gravida  6   Para  4   Term  4    Preterm      AB  2   Living  4     SAB  2   TAB      Ectopic      Multiple      Live Births  4           Family History  Problem Relation Age of Onset  . Heart disease Maternal Grandmother   . Hypertension Maternal Grandmother     Social History   Tobacco Use  . Smoking status: Current Some Day Smoker    Packs/day: 1.00    Types: E-cigarettes  . Smokeless tobacco: Never Used  Substance Use Topics  . Alcohol use: Yes    Comment: socially  . Drug use: No    Home Medications Prior to Admission medications   Medication Sig Start Date End Date Taking? Authorizing Provider  fluticasone (FLONASE) 50 MCG/ACT nasal spray Place 2 sprays into both nostrils daily. 03/30/18   Fulp, Cammie, MD  ibuprofen (ADVIL) 600 MG tablet Take 1 tablet (600 mg total) by mouth every 6 (six) hours as needed. 02/06/19   Domenic Moras, PA-C  methocarbamol (ROBAXIN) 500 MG tablet Take 2 tablets (1,000 mg total) by mouth 4 (four) times daily. 05/08/18   Carlisle Cater, PA-C  ondansetron (ZOFRAN ODT) 4 MG disintegrating tablet Take 1 tablet (4 mg total) by mouth every 8 (eight) hours as needed for nausea or  vomiting. 07/13/19   Volanda Napoleon, PA-C  oxyCODONE (ROXICODONE) 5 MG immediate release tablet Take 1 tablet (5 mg total) by mouth every 4 (four) hours as needed for up to 2 doses for severe pain. 09/08/18   Janeece Fitting, PA-C  oxyCODONE-acetaminophen (PERCOCET/ROXICET) 5-325 MG tablet Take 2 tablets by mouth every 4 (four) hours as needed for severe pain. 07/13/19   Volanda Napoleon, PA-C  sertraline (ZOLOFT) 50 MG tablet Take 1 tablet (50 mg total) by mouth daily. 03/30/18   Fulp, Ander Gaster, MD    Allergies    Hydrocodone and Codeine  Review of Systems   Review of Systems  Gastrointestinal: Positive for vomiting.  All other systems reviewed and are negative.   Physical Exam Updated Vital Signs BP (!) 157/83   Pulse 95   Temp 98.8 F (37.1 C)   Resp (!) 21   SpO2 100%   Physical  Exam Vitals and nursing note reviewed.  Constitutional:      General: She is not in acute distress.    Appearance: She is well-developed. She is obese.     Comments: A bit tremulous but nontoxic  HENT:     Head: Atraumatic.  Eyes:     Conjunctiva/sclera: Conjunctivae normal.  Cardiovascular:     Rate and Rhythm: Normal rate and regular rhythm.     Pulses: Normal pulses.     Heart sounds: Normal heart sounds.  Pulmonary:     Effort: Pulmonary effort is normal.     Breath sounds: Normal breath sounds. No wheezing, rhonchi or rales.  Abdominal:     Palpations: Abdomen is soft.     Tenderness: There is no abdominal tenderness.  Musculoskeletal:        General: Normal range of motion.     Cervical back: Neck supple.  Skin:    Findings: No rash.  Neurological:     Mental Status: She is alert.  Psychiatric:        Mood and Affect: Mood normal.     ED Results / Procedures / Treatments   Labs (all labs ordered are listed, but only abnormal results are displayed) Labs Reviewed  COMPREHENSIVE METABOLIC PANEL - Abnormal; Notable for the following components:      Result Value   CO2 20 (*)    Calcium 8.6 (*)    All other components within normal limits  CBC - Abnormal; Notable for the following components:   WBC 21.6 (*)    Hemoglobin 11.5 (*)    MCH 25.2 (*)    MCHC 29.5 (*)    RDW 17.5 (*)    Platelets 414 (*)    All other components within normal limits  LIPASE, BLOOD  MAGNESIUM  URINALYSIS, ROUTINE W REFLEX MICROSCOPIC  I-STAT BETA HCG BLOOD, ED ([facility], [facility], [facility] ONLY)    EKG None  ED ECG REPORT   Date: 02/03/2020  Rate: 90  Rhythm: normal sinus rhythm  QRS Axis: normal  Intervals: QT prolonged  ST/T Wave abnormalities: nonspecific ST changes  Conduction Disutrbances:none  Narrative Interpretation:   Old EKG Reviewed: unchanged  I have personally reviewed the EKG tracing and agree with the computerized printout as noted.   Radiology No results  found.  Procedures Procedures (including critical care time)  Medications Ordered in ED Medications  sodium chloride flush (NS) 0.9 % injection 3 mL (has no administration in time range)  ondansetron (ZOFRAN) injection 4 mg (4 mg Intravenous Given 02/03/20 0834)  sodium chloride 0.9 %  bolus 1,000 mL (1,000 mLs Intravenous New Bag/Given 02/03/20 0626)    ED Course  I have reviewed the triage vital signs and the nursing notes.  Pertinent labs & imaging results that were available during my care of the patient were reviewed by me and considered in my medical decision making (see chart for details).    MDM Rules/Calculators/A&P                          BP (!) 159/111   Pulse 98   Temp 98.8 F (37.1 C)   Resp 16   SpO2 96%   Final Clinical Impression(s) / ED Diagnoses Final diagnoses:  Nausea vomiting and diarrhea    Rx / DC Orders ED Discharge Orders    None     8:19 AM Patient developed nausea vomiting diarrhea shortly after marijuana use last day.  Still does nausea and feeling uncomfortable.  Does not have any significant abdominal pain on exam.  Has had prior cholecystectomy.  I suspect her symptoms likely secondary to cannabinol hyperemesis syndrome.  No right lower quadrant tenderness to suggest appendicitis. Zofran and IVF given.   9:05 AM EKG obtained to check for QT level prior to the administration of Haldol for suspect cannabinol hyperemesis.  Patient does have borderline prolonged QT with a QTC of 503.  Will check magnesium level.  Will avoid QT prolongation agent.  12:07 PM Normal mag level.  Pt tolerates PO.  Stable for discharge. Recommend avoidance of marijuana as it may contribute to her sxs.    Domenic Moras, PA-C 02/03/20 [place], MD 02/03/20 [phone]

## 2020-02-03 NOTE — ED Notes (Signed)
Pt encouraged to provide urine specimen per MD order.  

## 2020-02-03 NOTE — Discharge Instructions (Signed)
Please avoid marijuana use as it may contribute to your symptoms.

## 2020-02-03 NOTE — ED Notes (Signed)
Pt d/c home per MD order. Discharge summary reviewed with pt, pt verbalizes understanding. No complaints at discharge. Ambulatory off unit. No s/s of acute distress noted. Reports discharge ride home.

## 2020-04-18 ENCOUNTER — Other Ambulatory Visit: Payer: Self-pay

## 2020-04-18 ENCOUNTER — Emergency Department (HOSPITAL_COMMUNITY)
Admission: EM | Admit: 2020-04-18 | Discharge: 2020-04-19 | Disposition: A | Discharge status: Home / Self Care | Payer: Self-pay | Attending: Emergency Medicine | Admitting: Emergency Medicine

## 2020-04-18 ENCOUNTER — Encounter (HOSPITAL_COMMUNITY): Payer: Self-pay | Admitting: Emergency Medicine

## 2020-04-18 DIAGNOSIS — J029 Acute pharyngitis, unspecified: Secondary | ICD-10-CM | POA: Insufficient documentation

## 2020-04-18 DIAGNOSIS — Z5321 Procedure and treatment not carried out due to patient leaving prior to being seen by health care provider: Secondary | ICD-10-CM | POA: Insufficient documentation

## 2020-04-18 DIAGNOSIS — R221 Localized swelling, mass and lump, neck: Secondary | ICD-10-CM | POA: Insufficient documentation

## 2020-04-18 LAB — BASIC METABOLIC PANEL
Anion gap: 10 (ref 5–15)
BUN: 12 mg/dL (ref 6–20)
CO2: 24 mmol/L (ref 22–32)
Calcium: 8.8 mg/dL — ABNORMAL LOW (ref 8.9–10.3)
Chloride: 103 mmol/L (ref 98–111)
Creatinine, Ser: 0.86 mg/dL (ref 0.44–1.00)
GFR calc Af Amer: >60 mL/min (ref >60)
GFR calc non Af Amer: >60 mL/min (ref >60)
Glucose, Bld: 108 mg/dL — ABNORMAL HIGH (ref 70–99)
Potassium: 3.7 mmol/L (ref 3.5–5.1)
Sodium: 137 mmol/L (ref 135–145)

## 2020-04-18 LAB — CBC
HCT: 37.6 % (ref 36.0–46.0)
Hemoglobin: 11.3 g/dL — ABNORMAL LOW (ref 12.0–15.0)
MCH: 25.3 pg — ABNORMAL LOW (ref 26.0–34.0)
MCHC: 30.1 g/dL (ref 30.0–36.0)
MCV: 84.3 fL (ref 80.0–100.0)
Platelets: 350 10*3/uL (ref 150–400)
RBC: 4.46 MIL/uL (ref 3.87–5.11)
RDW: 18.5 % — ABNORMAL HIGH (ref 11.5–15.5)
WBC: 9.2 10*3/uL (ref 4.0–10.5)
nRBC: 0 % (ref 0.0–0.2)

## 2020-04-18 NOTE — ED Triage Notes (Signed)
Pt presents to ED POV. Pt c/o sore throat. Pt report that lump began today on R side of neck. Pt denies fevers at home. Airway intact, pt is able to swallow normally.

## 2020-04-19 NOTE — ED Notes (Signed)
No answer for vitals recheck x1 

## 2020-08-17 ENCOUNTER — Other Ambulatory Visit: Payer: Self-pay

## 2020-08-17 ENCOUNTER — Ambulatory Visit
Admission: EM | Admit: 2020-08-17 | Discharge: 2020-08-17 | Disposition: A | Discharge status: Home / Self Care | Payer: Self-pay

## 2020-08-17 DIAGNOSIS — N6310 Unspecified lump in the right breast, unspecified quadrant: Secondary | ICD-10-CM

## 2020-08-17 NOTE — ED Provider Notes (Signed)
[name]    CSN: [id number] Arrival date & time: 08/17/20  1339      History   Chief Complaint Chief Complaint  Patient presents with  . Breast Mass    HPI Elizabeth Dean is a 40 y.o. female  With history as below presenting for lump to right breast.  States she was doing a self-exam and noticed it about 2 days ago.  No change in appearance of breast, nipple discharge or inversion.  Does have family history of breast cancer: Mother diagnosed at age 15.  Patient has never had mammogram done, does not currently have PCP.  Does endorse history of axillary and breast abscesses.  Recently had left breast abscess, left axillary abscess, right axillary abscess.  Past Medical History:  Diagnosis Date  . Anemia   . Anxiety   . Asthma   . Bronchitis   . Depression   . GERD (gastroesophageal reflux disease)   . Panic attacks     Patient Active Problem List   Diagnosis Date Noted  . Pregnancy of unknown anatomic location 09/03/2015  . Healthcare maintenance 09/03/2015  . Hypertriglyceridemia 04/04/2014  . Asthma with acute exacerbation 04/04/2014  . Need for prophylactic vaccination and inoculation against influenza 04/04/2014  . Adjustment disorder with mixed anxiety and depressed mood 02/14/2014  . Muscle spasm 01/02/2014  . Major depression 01/02/2014  . Multiple falls 01/02/2014    Past Surgical History:  Procedure Laterality Date  . CHOLECYSTECTOMY      OB History    Gravida  6   Para  4   Term  4   Preterm      AB  2   Living  4     SAB  2   IAB      Ectopic      Multiple      Live Births  4            Home Medications    Prior to Admission medications   Not on File    Family History Family History  Problem Relation Age of Onset  . Heart disease Maternal Grandmother   . Hypertension Maternal Grandmother     Social History Social History   Tobacco Use  . Smoking status: Current Some Day Smoker    Packs/day:  1.00    Types: E-cigarettes  . Smokeless tobacco: Never Used  Substance Use Topics  . Alcohol use: Yes    Comment: socially  . Drug use: No     Allergies   Hydrocodone and Codeine   Review of Systems As per HPI   Physical Exam Triage Vital Signs ED Triage Vitals [08/17/20 1350]  Enc Vitals Group     BP (!) 148/75     Pulse Rate 85     Resp 18     Temp 98.8 F (37.1 C)     Temp Source Oral     SpO2 98 %     Weight      Height      Head Circumference      Peak Flow      Pain Score 0     Pain Loc      Pain Edu?      Excl. in [place]?    No data found.  Updated Vital Signs BP (!) 148/75 (BP Location: Left Arm)   Pulse 85   Temp 98.8 F (37.1 C) (Oral)   Resp 18   LMP 07/20/2020  SpO2 98%   Visual Acuity Right Eye Distance:   Left Eye Distance:   Bilateral Distance:    Right Eye Near:   Left Eye Near:    Bilateral Near:     Physical Exam Constitutional:      General: She is not in acute distress. HENT:     Head: Normocephalic and atraumatic.  Eyes:     General: No scleral icterus.    Pupils: Pupils are equal, round, and reactive to light.  Cardiovascular:     Rate and Rhythm: Normal rate.  Pulmonary:     Effort: Pulmonary effort is normal.  Chest:     Chest wall: Mass present. No tenderness, crepitus or edema.  Breasts:     Tanner Score is 5. Breasts are symmetrical.     Skin:    Coloration: Skin is not jaundiced or pale.  Neurological:     Mental Status: She is alert and oriented to person, place, and time.      UC Treatments / Results  Labs (all labs ordered are listed, but only abnormal results are displayed) Labs Reviewed - No data to display  EKG   Radiology No results found.  Procedures Procedures (including critical care time)  Medications Ordered in UC Medications - No data to display  Initial Impression / Assessment and Plan / UC Course  I have reviewed the triage vital signs and the nursing notes.  Pertinent  labs & imaging results that were available during my care of the patient were reviewed by me and considered in my medical decision making (see chart for details).     Patient appears well in office today.  Likely lymph node given small, rubbery, mobile mass.  Patient does have mother with breast cancer: Requesting mammogram.  Fax order, follow-up with [facility], PCP as needed.  Return precautions discussed, pt verbalized understanding and is agreeable to plan. Final Clinical Impressions(s) / UC Diagnoses   Final diagnoses:  Mass of breast, right   Discharge Instructions   None    ED Prescriptions    None     PDMP not reviewed this encounter.   Hall[place], [place], [place] 08/17/20 1608

## 2020-08-17 NOTE — ED Triage Notes (Signed)
Pt c/o lump to rt breast x2 days. Denies any changes in size or pain or tenderness.

## 2020-08-23 ENCOUNTER — Other Ambulatory Visit: Payer: Self-pay | Admitting: Emergency Medicine

## 2020-08-23 DIAGNOSIS — N6310 Unspecified lump in the right breast, unspecified quadrant: Secondary | ICD-10-CM

## 2020-09-03 ENCOUNTER — Telehealth: Payer: Self-pay

## 2020-09-03 NOTE — Telephone Encounter (Signed)
Telephoned patient at home number. Call released and unable to give patient appointment information.

## 2020-09-17 ENCOUNTER — Other Ambulatory Visit: Payer: Self-pay | Admitting: Obstetrics and Gynecology

## 2020-09-17 DIAGNOSIS — N6310 Unspecified lump in the right breast, unspecified quadrant: Secondary | ICD-10-CM

## 2020-09-20 ENCOUNTER — Ambulatory Visit: Payer: Self-pay

## 2020-09-20 ENCOUNTER — Other Ambulatory Visit: Payer: Self-pay

## 2020-10-23 ENCOUNTER — Ambulatory Visit: Payer: Self-pay

## 2020-11-06 ENCOUNTER — Ambulatory Visit
Admission: RE | Admit: 2020-11-06 | Discharge: 2020-11-06 | Disposition: A | Discharge status: Home / Self Care | Payer: No Typology Code available for payment source | Source: Ambulatory Visit | Attending: Obstetrics and Gynecology | Admitting: Obstetrics and Gynecology

## 2020-11-06 ENCOUNTER — Ambulatory Visit: Payer: No Typology Code available for payment source | Admitting: *Deleted

## 2020-11-06 ENCOUNTER — Other Ambulatory Visit: Payer: Self-pay

## 2020-11-06 ENCOUNTER — Other Ambulatory Visit: Payer: Self-pay | Admitting: Obstetrics and Gynecology

## 2020-11-06 VITALS — BP 128/88 | Wt 163.2 lb

## 2020-11-06 DIAGNOSIS — N6310 Unspecified lump in the right breast, unspecified quadrant: Secondary | ICD-10-CM

## 2020-11-06 DIAGNOSIS — R2232 Localized swelling, mass and lump, left upper limb: Secondary | ICD-10-CM

## 2020-11-06 DIAGNOSIS — Z01419 Encounter for gynecological examination (general) (routine) without abnormal findings: Secondary | ICD-10-CM

## 2020-11-06 DIAGNOSIS — N6315 Unspecified lump in the right breast, overlapping quadrants: Secondary | ICD-10-CM

## 2020-11-06 NOTE — Patient Instructions (Signed)
Explained breast self awareness with Elizabeth Dean. Pap smear completed today. Let her know [facility] will cover Pap smears and HPV typing every 5 years unless has a history of abnormal Pap smears. Referred patient to the [facility] for a diagnostic mammogram. Appointment scheduled Tuesday, November 06, 2020 at 1450. Patient aware of appointment and will be there. Let patient know will follow up with her within the next couple weeks with results of Pap smear by phone or letter. Discussed smoking cessation with patient. Referred to the [facility] Quitline and gave resources to the free smoking cessation classes at [facility]. Elizabeth Dean verbalized understanding.  Elizabeth Dean, Arvil Chaco, RN 2:13 PM

## 2020-11-06 NOTE — Progress Notes (Signed)
Elizabeth Dean is a 40 y.o. [phone] female who presents to [facility] clinic today with complaint of right breast lump x 1.5 months that has decreased in size.    Pap Smear: Pap smear completed today. Last Pap smear was 5 years ago at an Urgent Care clinic in [place] and was normalper patient. Per patient has no history of an abnormal Pap smear. Last Pap smear result is not available in Epic.   Physical exam: Breasts Breasts symmetrical. Skin lesions observed bilateral axilla that per patient has history of hidrodenitis. One lump in left axilla at 1:30 o'clock open. No nipple retraction bilateral breasts. No nipple discharge bilateral breasts. No lymphadenopathy. Palpated a lump within the right breast at 12 o'clock 5 cm from the nipple. Palpated two left axillary lumps at 1:30 o'clock 14 cm from the nipple and 2 o'clock 17 cm from the nipple. No complaints of pain or tenderness on exam.     Pelvic/Bimanual Ext Genitalia No lesions, no swelling and no discharge observed on external genitalia.        Vagina Vagina pink and normal texture. No lesions or discharge observed in vagina.        Cervix Cervix is present. Cervix pink and of normal texture. Possible polyp observed at right opening at cervical os. No discharge observed. Will refer to the [facility] for [place] to follow-up for possible cervical polyp. Let patient know that [facility] will not cover the follow-up and told patient about the [place] Application.   Uterus Uterus is present and palpable. Uterus in normal position and normal size.        Adnexae Bilateral ovaries present and palpable. No tenderness on palpation.         Rectovaginal No rectal exam completed today since patient had no rectal complaints. No skin abnormalities observed on exam.     Smoking History: Patient is a current smoker. Discussed smoking cessation with patient. Referred to the [facility] Quitline and gave resources to  the free smoking cessation classes at [facility].   Patient Navigation: Patient education provided. Access to services provided for patient through [facility] program.    Breast and Cervical Cancer Risk Assessment: Patient has family history of her mother having breast cancer. Patient has no known genetic mutations or history of radiation treatment to the chest before age 55. Patient does not have history of cervical dysplasia, immunocompromised, or DES exposure in-utero.  Risk Assessment    Risk Scores      11/06/2020   Last edited by: Royston Bake, CMA   5-year risk: 0.7 %   Lifetime risk: 11.7 %          A: [facility] exam with pap smear Complaint of right breast lump.  P: Referred patient to the [facility] for a diagnostic mammogram. Appointment scheduled Tuesday, November 06, 2020 at 1450.  Loletta Parish, RN 11/06/2020 2:13 PM

## 2020-11-08 LAB — CYTOLOGY - PAP
Comment: NEGATIVE
Diagnosis: UNDETERMINED — AB
High risk HPV: NEGATIVE

## 2020-11-09 ENCOUNTER — Telehealth: Payer: Self-pay

## 2020-11-09 NOTE — Telephone Encounter (Signed)
Patient informed Pap results, ASCUS, with negative HPV, repeat pap in 1 year. Patient verbalized understanding, stated she was to supposed to get rx for antibiotics for hidroadenitis in her left axilla. Per Dr. Jenne Campus, Doxycycline 100 mg 1 po bid x 16 weeks. Patient also to be referred to [facility] for possible cervical polyp, [place] application mailed to patient.   Although I spoke with patient earlier, I left message on voicemail requesting return call regarding rx.

## 2020-11-09 NOTE — Telephone Encounter (Addendum)
-----   Message from Osborne Oman, MD sent at 11/09/2020 11:41 AM EDT ----- Regarding: RE: Recommendations Doxycycline 100 mg po bid  x 16 weeks (this is the regimen in UpTo Date).  Follow up with Dermatologist or PCP for further management.  Thank you!   Verita Schneiders, MD  ----- Message ----- From: Demetrius Revel, LPN Sent: 4/65/0354   8:48 AM EDT To: Osborne Oman, MD Subject: Recommendations                                Good Morning,       I spoke with Ms. Elizabeth Dean today, and she stated that she was told that she needed antibiotics for the hidroadenitis in her left axilla. Dr. Rosana Hoes @ [facility] stated in her imaging results that she should consider antibiotic therapy. Patient did ask if he could give her a rx, but he informed patient that he is not able to do so. Patient is scheduled for biopsy (11/12/2020) for the right breast only. Please advise.  Pharmacy: [facility].   Thank you,  [employer], LPN [phone]

## 2020-11-12 ENCOUNTER — Ambulatory Visit
Admission: RE | Admit: 2020-11-12 | Discharge: 2020-11-12 | Disposition: A | Discharge status: Home / Self Care | Payer: No Typology Code available for payment source | Source: Ambulatory Visit | Attending: Obstetrics and Gynecology | Admitting: Obstetrics and Gynecology

## 2020-11-12 ENCOUNTER — Other Ambulatory Visit: Payer: Self-pay

## 2020-11-12 ENCOUNTER — Other Ambulatory Visit: Payer: Self-pay | Admitting: Obstetrics & Gynecology

## 2020-11-12 ENCOUNTER — Other Ambulatory Visit: Payer: Self-pay | Admitting: Obstetrics and Gynecology

## 2020-11-12 DIAGNOSIS — N6310 Unspecified lump in the right breast, unspecified quadrant: Secondary | ICD-10-CM

## 2020-11-12 DIAGNOSIS — L732 Hidradenitis suppurativa: Secondary | ICD-10-CM

## 2020-11-12 MED ORDER — DOXYCYCLINE MONOHYDRATE 100 MG PO TABS: 100.0000 mg | ORAL_TABLET | Freq: Two times a day (BID) | ORAL | 0 refills | Status: DC

## 2020-11-12 NOTE — Progress Notes (Signed)
[facility] Management Attending Note   Patient prescribed Doxycyline 100 mg po bid x 3 months as treatment for her hidradenitis suppurativa. Needs to follow up with PCP and/or dermatologist for further monitoring during this therapy and for further management.   Verita Schneiders, MD, [place] for [employer], [place]

## 2020-11-13 ENCOUNTER — Telehealth: Payer: Self-pay

## 2020-11-13 NOTE — Telephone Encounter (Signed)
Attempted to contact patient regarding rx per Dr. Harolyn Rutherford. Unable to leave message, voicemail box not set up.

## 2020-11-15 ENCOUNTER — Telehealth: Payer: Self-pay

## 2020-11-15 NOTE — Telephone Encounter (Signed)
Left detailed message on voicemail, Per Dr. Jenne Campus rx doxycycline 100 mg 1 po bid x 3 months sent to [facility] on 11/12/2020, call with any questions/concerns. I have attempted to contact patient since 11/12/2020.

## 2020-11-22 ENCOUNTER — Other Ambulatory Visit: Payer: Self-pay

## 2020-11-22 ENCOUNTER — Emergency Department (HOSPITAL_COMMUNITY): Payer: No Typology Code available for payment source

## 2020-11-22 ENCOUNTER — Encounter (HOSPITAL_COMMUNITY): Payer: Self-pay

## 2020-11-22 ENCOUNTER — Emergency Department (HOSPITAL_COMMUNITY)
Admission: EM | Admit: 2020-11-22 | Discharge: 2020-11-22 | Disposition: A | Discharge status: Home / Self Care | Payer: No Typology Code available for payment source | Attending: Emergency Medicine | Admitting: Emergency Medicine

## 2020-11-22 DIAGNOSIS — N390 Urinary tract infection, site not specified: Secondary | ICD-10-CM | POA: Insufficient documentation

## 2020-11-22 DIAGNOSIS — D72829 Elevated white blood cell count, unspecified: Secondary | ICD-10-CM | POA: Insufficient documentation

## 2020-11-22 DIAGNOSIS — R42 Dizziness and giddiness: Secondary | ICD-10-CM | POA: Insufficient documentation

## 2020-11-22 DIAGNOSIS — J45901 Unspecified asthma with (acute) exacerbation: Secondary | ICD-10-CM | POA: Insufficient documentation

## 2020-11-22 DIAGNOSIS — R112 Nausea with vomiting, unspecified: Secondary | ICD-10-CM | POA: Insufficient documentation

## 2020-11-22 DIAGNOSIS — R102 Pelvic and perineal pain: Secondary | ICD-10-CM

## 2020-11-22 DIAGNOSIS — F17290 Nicotine dependence, other tobacco product, uncomplicated: Secondary | ICD-10-CM | POA: Insufficient documentation

## 2020-11-22 DIAGNOSIS — D259 Leiomyoma of uterus, unspecified: Secondary | ICD-10-CM | POA: Insufficient documentation

## 2020-11-22 LAB — CBC
HCT: 39.6 % (ref 36.0–46.0)
Hemoglobin: 12.2 g/dL (ref 12.0–15.0)
MCH: 26.2 pg (ref 26.0–34.0)
MCHC: 30.8 g/dL (ref 30.0–36.0)
MCV: 85 fL (ref 80.0–100.0)
Platelets: 360 10*3/uL (ref 150–400)
RBC: 4.66 MIL/uL (ref 3.87–5.11)
RDW: 16.9 % — ABNORMAL HIGH (ref 11.5–15.5)
WBC: 11.6 10*3/uL — ABNORMAL HIGH (ref 4.0–10.5)
nRBC: 0 % (ref 0.0–0.2)

## 2020-11-22 LAB — COMPREHENSIVE METABOLIC PANEL
ALT: 25 U/L (ref 0–44)
AST: 22 U/L (ref 15–41)
Albumin: 3.8 g/dL (ref 3.5–5.0)
Alkaline Phosphatase: 65 U/L (ref 38–126)
Anion gap: 8 (ref 5–15)
BUN: 10 mg/dL (ref 6–20)
CO2: 25 mmol/L (ref 22–32)
Calcium: 8.8 mg/dL — ABNORMAL LOW (ref 8.9–10.3)
Chloride: 104 mmol/L (ref 98–111)
Creatinine, Ser: 0.76 mg/dL (ref 0.44–1.00)
GFR, Estimated: >60 mL/min (ref >60)
Glucose, Bld: 103 mg/dL — ABNORMAL HIGH (ref 70–99)
Potassium: 3.4 mmol/L — ABNORMAL LOW (ref 3.5–5.1)
Sodium: 137 mmol/L (ref 135–145)
Total Bilirubin: 0.2 mg/dL — ABNORMAL LOW (ref 0.3–1.2)
Total Protein: 7.3 g/dL (ref 6.5–8.1)

## 2020-11-22 LAB — URINALYSIS, ROUTINE W REFLEX MICROSCOPIC
Bacteria, UA: NONE SEEN
Bilirubin Urine: NEGATIVE
Glucose, UA: NEGATIVE mg/dL
Ketones, ur: NEGATIVE mg/dL
Nitrite: NEGATIVE
Protein, ur: NEGATIVE mg/dL
Specific Gravity, Urine: 1.021 (ref 1.005–1.030)
pH: 6 (ref 5.0–8.0)

## 2020-11-22 LAB — WET PREP, GENITAL
Clue Cells Wet Prep HPF POC: NONE SEEN
Sperm: NONE SEEN
Trich, Wet Prep: NONE SEEN
Yeast Wet Prep HPF POC: NONE SEEN

## 2020-11-22 LAB — LIPASE, BLOOD: Lipase: 33 U/L (ref 11–51)

## 2020-11-22 LAB — I-STAT BETA HCG BLOOD, ED (MC, WL, AP ONLY): I-stat hCG, quantitative: <5 m[IU]/mL (ref <5)

## 2020-11-22 MED ORDER — CEPHALEXIN 500 MG PO CAPS: 500.0000 mg | ORAL_CAPSULE | Freq: Two times a day (BID) | ORAL | 0 refills | Status: AC

## 2020-11-22 NOTE — ED Triage Notes (Signed)
Patient c/o dizziness, vomiting, and pelvic pain. Patient states her period stopped after just 2 days then she had a large clot of blood.

## 2020-11-22 NOTE — Discharge Instructions (Signed)
Please follow up with your OBGYN regarding your ED visit today.  We are treating you for a urinary tract infection at this time with antibiotics. Please pick up the antibiotics and take as prescribed. We will call you in 2-3 days time if the antibiotic needs to be changed.   We have also tested for gonorrhea and chlamydia. We will call you in 2-3 days time IF you test positive. Please refrain from intercourse until you receive your results.   Return to the ED for any new/worsening symptoms

## 2020-11-22 NOTE — ED Provider Notes (Signed)
Emergency Medicine Provider Triage Evaluation Note  Elizabeth Dean , a 40 y.o. female  was evaluated in triage.  Pt complains of pelvic pain for the past 4 days. Pt also complains of lightheadedness, nausea, vomiting and abnormal menstrual cycle. She started her cycle on May 1st and it lasted 2 days which is atypical. She is sexually active with 1 female partner; they do not use protection. She has noticed clear vaginal discharge in her underwear since the pain started.   Review of Systems  Positive: + abdominal pain,  Nausea, vomiting Negative: - urinary symptoms, diarrhea  Physical Exam  BP (!) 143/96 (BP Location: Left Arm)   Pulse 87   Temp 98.4 F (36.9 C) (Oral)   Resp 18   Ht 5' (1.524 m)   Wt 74.8 kg   LMP 11/18/2020   SpO2 99%   BMI 32.22 kg/m  Gen:   Awake, no distress   Resp:  Normal effort  MSK:   Moves extremities without difficulty  Other:  + lower abdominal TTP  Medical Decision Making  Medically screening exam initiated at 11:26 AM.  Appropriate orders placed.  Carizma L Jiles was informed that the remainder of the evaluation will be completed by another provider, this initial triage assessment does not replace that evaluation, and the importance of remaining in the ED until their evaluation is complete.  Labs ordered. Will need pelvic exam. Concern for possible pregnancy given period only lasting 2 days this cycle. +/- pelvic ultrasound dependent on exam.    Eustaquio Maize, PA-C 11/22/20 1128    Daleen Bo, MD 11/22/20 1556

## 2020-11-22 NOTE — ED Provider Notes (Signed)
[facility] DEPT Provider Note   CSN: [id number] Arrival date & time: 11/22/20  1040     History Chief Complaint  Patient presents with  . Dizziness  . Emesis  . Pelvic Pain    Elizabeth Dean is a 40 y.o. female who presents to the ED today with complaint of gradual onset, constant, "throbbing" type pain to her pelvic area for the past 4 days. Pt mentions that her pain started after she finished her last menstrual cycle however this cycle only lasted 2 days which is atypical for her. She also complains of lightheadedness, nausea, and NBNB emesis. She initially denied chance of pregnancy however she is sexually active with 1 female partner and they do not use protection. She is not concerned regarding STDs.  She does mention feeling a "wetness" in her underwear since her pain started. Pt denies recent sick contact. Denies fevers, chills, urinary symptoms, or any other associated symptoms.   The history is provided by the patient and medical records.       Past Medical History:  Diagnosis Date  . Anemia   . Anxiety   . Asthma   . Bronchitis   . Depression   . GERD (gastroesophageal reflux disease)   . Panic attacks     Patient Active Problem List   Diagnosis Date Noted  . Hidradenitis suppurativa 11/12/2020  . Healthcare maintenance 09/03/2015  . Hypertriglyceridemia 04/04/2014  . Asthma with acute exacerbation 04/04/2014  . Need for prophylactic vaccination and inoculation against influenza 04/04/2014  . Adjustment disorder with mixed anxiety and depressed mood 02/14/2014  . Muscle spasm 01/02/2014  . Major depression 01/02/2014  . Multiple falls 01/02/2014    Past Surgical History:  Procedure Laterality Date  . BREAST SURGERY    . CHOLECYSTECTOMY       OB History    Gravida  6   Para  4   Term  4   Preterm      AB  2   Living  4     SAB  2   IAB      Ectopic      Multiple      Live Births  4           Family  History  Problem Relation Age of Onset  . Heart disease Maternal Grandmother   . Hypertension Maternal Grandmother   . Breast cancer Mother     Social History   Tobacco Use  . Smoking status: Current Some Day Smoker    Packs/day: 1.00    Types: E-cigarettes  . Smokeless tobacco: Never Used  . Tobacco comment: not every day  Vaping Use  . Vaping Use: Never used  Substance Use Topics  . Alcohol use: Yes    Comment: socially  . Drug use: No    Home Medications Prior to Admission medications   Medication Sig Start Date End Date Taking? Authorizing Provider  cephALEXin (KEFLEX) 500 MG capsule Take 1 capsule (500 mg total) by mouth 2 (two) times daily for 5 days. 11/22/20 11/27/20 Yes Westyn Driggers, PA-C  doxycycline (ADOXA) 100 MG tablet Take 1 tablet (100 mg total) by mouth 2 (two) times daily. For three months 11/12/20   Anyanwu, Sallyanne Havers, MD  loratadine (CLARITIN) 10 MG tablet Take 10 mg by mouth daily.    [provider]    Allergies    Hydrocodone and Codeine  Review of Systems   Review of Systems  Constitutional:  Negative for chills and fever.  Gastrointestinal: Positive for nausea and vomiting. Negative for constipation and diarrhea.  Genitourinary: Positive for menstrual problem, pelvic pain and vaginal discharge.  All other systems reviewed and are negative.   Physical Exam Updated Vital Signs BP (!) 150/90   Pulse 88   Temp 98.4 F (36.9 C) (Oral)   Resp 18   Ht 5' (1.524 m)   Wt 74.8 kg   LMP 11/18/2020   SpO2 100%   BMI 32.22 kg/m   Physical Exam Vitals and nursing note reviewed.  Constitutional:      Appearance: She is obese. She is not ill-appearing or diaphoretic.  HENT:     Head: Normocephalic and atraumatic.  Eyes:     Conjunctiva/sclera: Conjunctivae normal.  Cardiovascular:     Rate and Rhythm: Normal rate and regular rhythm.     Pulses: Normal pulses.  Pulmonary:     Effort: Pulmonary effort is normal.     Breath sounds:  Normal breath sounds. No wheezing, rhonchi or rales.  Abdominal:     Palpations: Abdomen is soft.     Tenderness: There is no abdominal tenderness. There is no guarding or rebound.     Comments: Soft, + suprapubic TTP, +BS throughout, no r/g/r, neg murphy's, neg mcburney's, no CVA TTP  Genitourinary:    Comments: Chaperone present for exam. No rashes, lesions, or tenderness to external genitalia. No erythema, injury, or tenderness to vaginal mucosa. Thin yellow vaginal discharge in vault. + right adnexal TTP. No  CMT, cervical friability, or discharge from cervical os. Cervical os is closed. Uterus non-deviated, mobile, nonTTP, and without enlargement.  Musculoskeletal:     Cervical back: Neck supple.  Skin:    General: Skin is warm and dry.  Neurological:     Mental Status: She is alert.     ED Results / Procedures / Treatments   Labs (all labs ordered are listed, but only abnormal results are displayed) Labs Reviewed  WET PREP, GENITAL - Abnormal; Notable for the following components:      Result Value   WBC, Wet Prep HPF POC FEW (*)    All other components within normal limits  COMPREHENSIVE METABOLIC PANEL - Abnormal; Notable for the following components:   Potassium 3.4 (*)    Glucose, Bld 103 (*)    Calcium 8.8 (*)    Total Bilirubin 0.2 (*)    All other components within normal limits  CBC - Abnormal; Notable for the following components:   WBC 11.6 (*)    RDW 16.9 (*)    All other components within normal limits  URINALYSIS, ROUTINE W REFLEX MICROSCOPIC - Abnormal; Notable for the following components:   Hgb urine dipstick MODERATE (*)    Leukocytes,Ua MODERATE (*)    All other components within normal limits  URINE CULTURE  LIPASE, BLOOD  I-STAT BETA HCG BLOOD, ED ([facility], [facility], [facility] ONLY)  GC/CHLAMYDIA PROBE AMP ([place]) NOT AT [facility]    EKG None EKG: unchanged from previous tracings.  Radiology US Transvaginal Non-OB  Result Date: 11/22/2020 CLINICAL DATA:   Right adnexal pain. EXAM: TRANSABDOMINAL AND TRANSVAGINAL ULTRASOUND OF PELVIS DOPPLER ULTRASOUND OF OVARIES TECHNIQUE: Both transabdominal and transvaginal ultrasound examinations of the pelvis were performed. Transabdominal technique was performed for global imaging of the pelvis including uterus, ovaries, adnexal regions, and pelvic cul-de-sac. It was necessary to proceed with endovaginal exam following the transabdominal exam to visualize the uterus, endometrium and ovaries in better detail. Color and  duplex Doppler ultrasound was utilized to evaluate blood flow to the ovaries. COMPARISON:  09/12/2019 FINDINGS: Uterus Measurements: 11.9 x 6.9 x 6.9 cm = volume: 299 mL. Multiple poorly defined, heterogeneous, rounded myometrial masses are again demonstrated. These are difficult to measure. The largest measures approximately 4.3 cm in maximum diameter today. No definite submucosal components are seen. Endometrium Thickness: 6.2 mm.  No focal abnormality visualized. Right ovary Measurements: 4.0 x 2.6 x 2.2 cm = volume: 11.8 mL. Normal appearance/no adnexal mass. Left ovary Measurements: 2.6 x 2.0 x 1.4 cm = volume: 3.9 mL. Normal appearance/no adnexal mass. Pulsed Doppler evaluation of both ovaries demonstrates normal low-resistance arterial and venous waveforms. Other findings No abnormal free fluid. IMPRESSION: 1. No acute abnormality. 2. No evidence of ovarian torsion. 3. Multiple uterine fibroids with a mild increase in size of the largest fibroid, currently measuring 4.3 cm in maximum diameter, previously 3.1 cm. Electronically Signed   By: Claudie Revering M.D.   On: 11/22/2020 15:00   US Pelvis Complete  Result Date: 11/22/2020 CLINICAL DATA:  Right adnexal pain. EXAM: TRANSABDOMINAL AND TRANSVAGINAL ULTRASOUND OF PELVIS DOPPLER ULTRASOUND OF OVARIES TECHNIQUE: Both transabdominal and transvaginal ultrasound examinations of the pelvis were performed. Transabdominal technique was performed for global imaging  of the pelvis including uterus, ovaries, adnexal regions, and pelvic cul-de-sac. It was necessary to proceed with endovaginal exam following the transabdominal exam to visualize the uterus, endometrium and ovaries in better detail. Color and duplex Doppler ultrasound was utilized to evaluate blood flow to the ovaries. COMPARISON:  09/12/2019 FINDINGS: Uterus Measurements: 11.9 x 6.9 x 6.9 cm = volume: 299 mL. Multiple poorly defined, heterogeneous, rounded myometrial masses are again demonstrated. These are difficult to measure. The largest measures approximately 4.3 cm in maximum diameter today. No definite submucosal components are seen. Endometrium Thickness: 6.2 mm.  No focal abnormality visualized. Right ovary Measurements: 4.0 x 2.6 x 2.2 cm = volume: 11.8 mL. Normal appearance/no adnexal mass. Left ovary Measurements: 2.6 x 2.0 x 1.4 cm = volume: 3.9 mL. Normal appearance/no adnexal mass. Pulsed Doppler evaluation of both ovaries demonstrates normal low-resistance arterial and venous waveforms. Other findings No abnormal free fluid. IMPRESSION: 1. No acute abnormality. 2. No evidence of ovarian torsion. 3. Multiple uterine fibroids with a mild increase in size of the largest fibroid, currently measuring 4.3 cm in maximum diameter, previously 3.1 cm. Electronically Signed   By: Claudie Revering M.D.   On: 11/22/2020 15:00   [place] Art/Ven Flow Abd Pelv Doppler  Result Date: 11/22/2020 CLINICAL DATA:  Right adnexal pain. EXAM: TRANSABDOMINAL AND TRANSVAGINAL ULTRASOUND OF PELVIS DOPPLER ULTRASOUND OF OVARIES TECHNIQUE: Both transabdominal and transvaginal ultrasound examinations of the pelvis were performed. Transabdominal technique was performed for global imaging of the pelvis including uterus, ovaries, adnexal regions, and pelvic cul-de-sac. It was necessary to proceed with endovaginal exam following the transabdominal exam to visualize the uterus, endometrium and ovaries in better detail. Color and duplex  Doppler ultrasound was utilized to evaluate blood flow to the ovaries. COMPARISON:  09/12/2019 FINDINGS: Uterus Measurements: 11.9 x 6.9 x 6.9 cm = volume: 299 mL. Multiple poorly defined, heterogeneous, rounded myometrial masses are again demonstrated. These are difficult to measure. The largest measures approximately 4.3 cm in maximum diameter today. No definite submucosal components are seen. Endometrium Thickness: 6.2 mm.  No focal abnormality visualized. Right ovary Measurements: 4.0 x 2.6 x 2.2 cm = volume: 11.8 mL. Normal appearance/no adnexal mass. Left ovary Measurements: 2.6 x 2.0 x 1.4 cm =  volume: 3.9 mL. Normal appearance/no adnexal mass. Pulsed Doppler evaluation of both ovaries demonstrates normal low-resistance arterial and venous waveforms. Other findings No abnormal free fluid. IMPRESSION: 1. No acute abnormality. 2. No evidence of ovarian torsion. 3. Multiple uterine fibroids with a mild increase in size of the largest fibroid, currently measuring 4.3 cm in maximum diameter, previously 3.1 cm. Electronically Signed   By: Claudie Revering M.D.   On: 11/22/2020 15:00    Procedures Procedures   Medications Ordered in ED Medications - No data to display  ED Course  I have reviewed the triage vital signs and the nursing notes.  Pertinent labs & imaging results that were available during my care of the patient were reviewed by me and considered in my medical decision making (see chart for details).    MDM Rules/Calculators/A&P                          40 year old female who presents to the ED today complaining of suprapubic abdominal pain/pelvic pain with nausea, vomiting, vaginal discharge as well as irregular menstrual period this past week.  On arrival to the ED vitals are stable.  Patient was medically screened by myself and work-up initiated including CBC, CMP, lipase ordered by nursing staff as well as beta hCG as well as urinalysis.  CBC with mild leukocytosis 11,600.  CMP with  potassium of 3.4.  No other electrolyte abnormalities.  Lipase within normal limits at 33.  Urinalysis with moderate hemoglobin on dipstick, moderate leuks, 21-50 red blood cells, 11-20 white blood cells.  Patient denies any urinary symptoms.  She has no flank tenderness palpation.  Beta-hCG negative.  Pelvic exam performed when patient is brought back to her room, thin yellow vaginal discharge in vault.  Pending wet prep at this time.  She does have right adnexal tenderness palpation on exam.  Will obtain ultrasound at this time.  She is sexually active with 1 female partner however is no concern regarding STIs at this time.  He has no cervical motion tenderness to suggest PID.  She is currently on doxycycline for 3 months for hidradenitis suppurativa.  Wet prep with few bacteria; no yeast, trich, or clue cells appreciated  Ultrasound: IMPRESSION:  1. No acute abnormality.  2. No evidence of ovarian torsion.  3. Multiple uterine fibroids with a mild increase in size of the  largest fibroid, currently measuring 4.3 cm in maximum diameter,  previously 3.1 cm.   On reevaluation pt resting comfortably. She has not vomited since being in the ED. Tolerating PO without difficulty. Will discharge at this time with close OBGYN follow up. Given bacteria in urine will treat for UTI. Pt instructed to refrain from intercourse until she receives her gonorrhea and chlamydia results. Will hold off on treatment at this time; suspect discharge is physiological. Pt in agreement with plan and stable for discharge home.   This note was prepared using Dragon voice recognition software and may include unintentional dictation errors due to the inherent limitations of voice recognition software.    Final Clinical Impression(s) / ED Diagnoses Final diagnoses:  Lower urinary tract infectious disease  Pelvic pain in female  Uterine leiomyoma, unspecified location    Rx / DC Orders ED Discharge Orders         Ordered     cephALEXin (KEFLEX) 500 MG capsule  2 times daily        11/22/20 1513  Discharge Instructions     Please follow up with your OBGYN regarding your ED visit today.  We are treating you for a urinary tract infection at this time with antibiotics. Please pick up the antibiotics and take as prescribed. We will call you in 2-3 days time if the antibiotic needs to be changed.   We have also tested for gonorrhea and chlamydia. We will call you in 2-3 days time IF you test positive. Please refrain from intercourse until you receive your results.   Return to the ED for any new/worsening symptoms       Eustaquio Maize, Hershal Coria 11/22/20 1518    Daleen Bo, MD 11/22/20 1556

## 2020-11-23 LAB — GC/CHLAMYDIA PROBE AMP (~~LOC~~) NOT AT ARMC
Chlamydia: POSITIVE — AB
Comment: NEGATIVE
Comment: NORMAL
Neisseria Gonorrhea: POSITIVE — AB

## 2020-11-24 LAB — URINE CULTURE

## 2020-11-26 ENCOUNTER — Telehealth (HOSPITAL_COMMUNITY): Payer: Self-pay

## 2020-12-10 ENCOUNTER — Ambulatory Visit: Payer: No Typology Code available for payment source | Admitting: Family Medicine

## 2021-01-23 ENCOUNTER — Encounter: Payer: Self-pay | Admitting: Emergency Medicine

## 2021-01-23 ENCOUNTER — Other Ambulatory Visit: Payer: Self-pay

## 2021-01-23 ENCOUNTER — Ambulatory Visit
Admission: EM | Admit: 2021-01-23 | Discharge: 2021-01-23 | Disposition: A | Discharge status: Home / Self Care | Payer: No Typology Code available for payment source | Attending: Family Medicine | Admitting: Family Medicine

## 2021-01-23 DIAGNOSIS — R300 Dysuria: Secondary | ICD-10-CM | POA: Insufficient documentation

## 2021-01-23 DIAGNOSIS — R112 Nausea with vomiting, unspecified: Secondary | ICD-10-CM

## 2021-01-23 DIAGNOSIS — R109 Unspecified abdominal pain: Secondary | ICD-10-CM | POA: Insufficient documentation

## 2021-01-23 DIAGNOSIS — R310 Gross hematuria: Secondary | ICD-10-CM | POA: Insufficient documentation

## 2021-01-23 LAB — POCT URINALYSIS DIP (MANUAL ENTRY)
Bilirubin, UA: NEGATIVE
Glucose, UA: NEGATIVE mg/dL
Ketones, POC UA: NEGATIVE mg/dL
Nitrite, UA: NEGATIVE
Protein Ur, POC: >=300 mg/dL — AB
Spec Grav, UA: 1.025 (ref 1.010–1.025)
Urobilinogen, UA: 1 E.U./dL
pH, UA: 7 (ref 5.0–8.0)

## 2021-01-23 MED ORDER — ONDANSETRON 4 MG PO TBDP
4.0000 mg | ORAL_TABLET | Freq: Once | ORAL | Status: AC
  Administered 2021-01-23: 4 mg via ORAL

## 2021-01-23 MED ORDER — TAMSULOSIN HCL 0.4 MG PO CAPS: 0.4000 mg | ORAL_CAPSULE | Freq: Every day | ORAL | 0 refills | Status: DC

## 2021-01-23 MED ORDER — CEPHALEXIN 500 MG PO CAPS: 500.0000 mg | ORAL_CAPSULE | Freq: Two times a day (BID) | ORAL | 0 refills | Status: DC

## 2021-01-23 MED ORDER — KETOROLAC TROMETHAMINE 60 MG/2ML IM SOLN
60.0000 mg | Freq: Once | INTRAMUSCULAR | Status: AC
  Administered 2021-01-23: 60 mg via INTRAMUSCULAR

## 2021-01-23 MED ORDER — ONDANSETRON 4 MG PO TBDP: 4.0000 mg | ORAL_TABLET | Freq: Three times a day (TID) | ORAL | 0 refills | Status: DC | PRN

## 2021-01-23 NOTE — ED Provider Notes (Signed)
[name]    CSN: [id number] Arrival date & time: 01/23/21  1740      History   Chief Complaint Chief Complaint  Patient presents with   Hematuria   Flank Pain    HPI Elizabeth Dean is a 40 y.o. female.   Presenting today with right flank pain that is severe in nature and gross hematuria, dysuria for the past 3 days.  She denies fever, chills, abdominal pain, diarrhea or constipation.  Today has been having intractable nausea and vomiting because of the severity of the pain.  Notes that she has been told before that she is got kidney stones on the right side that would eventually pass, feels this is what is going on with her.  So far trying over-the-counter pain relievers with absolutely no benefit.  LMP 01/16/2021.   Past Medical History:  Diagnosis Date   Anemia    Anxiety    Asthma    Bronchitis    Depression    GERD (gastroesophageal reflux disease)    Panic attacks     Patient Active Problem List   Diagnosis Date Noted   Hidradenitis suppurativa 11/12/2020   Healthcare maintenance 09/03/2015   Hypertriglyceridemia 04/04/2014   Asthma with acute exacerbation 04/04/2014   Need for prophylactic vaccination and inoculation against influenza 04/04/2014   Adjustment disorder with mixed anxiety and depressed mood 02/14/2014   Muscle spasm 01/02/2014   Major depression 01/02/2014   Multiple falls 01/02/2014    Past Surgical History:  Procedure Laterality Date   BREAST SURGERY     CHOLECYSTECTOMY      OB History     Gravida  6   Para  4   Term  4   Preterm      AB  2   Living  4      SAB  2   IAB      Ectopic      Multiple      Live Births  4            Home Medications    Prior to Admission medications   Medication Sig Start Date End Date Taking? Authorizing Provider  cephALEXin (KEFLEX) 500 MG capsule Take 1 capsule (500 mg total) by mouth 2 (two) times daily. 01/23/21  Yes Volney American, PA-C  ondansetron  (ZOFRAN ODT) 4 MG disintegrating tablet Take 1 tablet (4 mg total) by mouth every 8 (eight) hours as needed for nausea or vomiting. 01/23/21  Yes Volney American, PA-C  tamsulosin (FLOMAX) 0.4 MG CAPS capsule Take 1 capsule (0.4 mg total) by mouth daily. 01/23/21  Yes Volney American, PA-C  doxycycline (ADOXA) 100 MG tablet Take 1 tablet (100 mg total) by mouth 2 (two) times daily. For three months 11/12/20   Anyanwu, Sallyanne Havers, MD  loratadine (CLARITIN) 10 MG tablet Take 10 mg by mouth daily.    [provider]    Family History Family History  Problem Relation Age of Onset   Heart disease Maternal Grandmother    Hypertension Maternal Grandmother    Breast cancer Mother     Social History Social History   Tobacco Use   Smoking status: Some Days    Packs/day: 1.00    Pack years: 0.00    Types: E-cigarettes, Cigarettes   Smokeless tobacco: Never   Tobacco comments:    not every day  Vaping Use   Vaping Use: Never used  Substance Use Topics   Alcohol  use: Yes    Comment: socially   Drug use: No     Allergies   Hydrocodone and Codeine   Review of Systems Review of Systems Per HPI  Physical Exam Triage Vital Signs ED Triage Vitals  Enc Vitals Group     BP 01/23/21 1859 127/86     Pulse Rate 01/23/21 1859 (!) 102     Resp 01/23/21 1859 18     Temp 01/23/21 1859 98.1 F (36.7 C)     Temp Source 01/23/21 1859 Oral     SpO2 01/23/21 1859 97 %     Weight --      Height --      Head Circumference --      Peak Flow --      Pain Score 01/23/21 1906 10     Pain Loc --      Pain Edu? --      Excl. in [place]? --    No data found.  Updated Vital Signs BP 127/86 (BP Location: Left Arm)   Pulse (!) 102   Temp 98.1 F (36.7 C) (Oral)   Resp 18   LMP 01/16/2021 (Approximate)   SpO2 97%   Visual Acuity Right Eye Distance:   Left Eye Distance:   Bilateral Distance:    Right Eye Near:   Left Eye Near:    Bilateral Near:     Physical Exam Vitals  and nursing note reviewed.  Constitutional:      Appearance: Normal appearance. She is ill-appearing.     Comments: Appears to be in a significant amount of pain  HENT:     Head: Atraumatic.     Mouth/Throat:     Mouth: Mucous membranes are moist.  Eyes:     Extraocular Movements: Extraocular movements intact.     Conjunctiva/sclera: Conjunctivae normal.  Cardiovascular:     Rate and Rhythm: Normal rate and regular rhythm.     Heart sounds: Normal heart sounds.  Pulmonary:     Effort: Pulmonary effort is normal.     Breath sounds: Normal breath sounds.  Abdominal:     General: Bowel sounds are normal. There is no distension.     Palpations: Abdomen is soft.     Tenderness: There is abdominal tenderness. There is right CVA tenderness. There is no left CVA tenderness or guarding.     Comments: Actively vomiting during interview  Musculoskeletal:        General: Normal range of motion.     Cervical back: Normal range of motion and neck supple.  Skin:    General: Skin is warm and dry.  Neurological:     Mental Status: She is alert and oriented to person, place, and time.  Psychiatric:        Mood and Affect: Mood normal.        Thought Content: Thought content normal.        Judgment: Judgment normal.     UC Treatments / Results  Labs (all labs ordered are listed, but only abnormal results are displayed) Labs Reviewed  POCT URINALYSIS DIP (MANUAL ENTRY) - Abnormal; Notable for the following components:      Result Value   Clarity, UA cloudy (*)    Blood, UA large (*)    Protein Ur, POC >=300 (*)    Leukocytes, UA Moderate (2+) (*)    All other components within normal limits  URINE CULTURE    EKG   Radiology No results found.  Procedures Procedures (including critical care time)  Medications Ordered in UC Medications  ketorolac (TORADOL) injection 60 mg (has no administration in time range)  ondansetron (ZOFRAN-ODT) disintegrating tablet 4 mg (has no  administration in time range)    Initial Impression / Assessment and Plan / UC Course  I have reviewed the triage vital signs and the nursing notes.  Pertinent labs & imaging results that were available during my care of the patient were reviewed by me and considered in my medical decision making (see chart for details).     Strong suspicion for kidney stone with consistent symptoms and large hemoglobin on the UA.  Also 2+ leukocytes on UA so will send Keflex and send out a urine culture in case there is a concurrent urinary tract infection going along with the possible kidney stone.  Flomax sent to pharmacy additionally for support.  IM Toradol and Zofran given in clinic for symptomatic benefit.  Discussed strict return precautions for acutely worsening symptoms.  Final Clinical Impressions(s) / UC Diagnoses   Final diagnoses:  Gross hematuria  Right flank pain  Dysuria  Intractable vomiting with nausea, unspecified vomiting type   Discharge Instructions   None    ED Prescriptions     Medication Sig Dispense Auth. Provider   tamsulosin (FLOMAX) 0.4 MG CAPS capsule Take 1 capsule (0.4 mg total) by mouth daily. 14 capsule Volney American, PA-C   ondansetron (ZOFRAN ODT) 4 MG disintegrating tablet Take 1 tablet (4 mg total) by mouth every 8 (eight) hours as needed for nausea or vomiting. 20 tablet Volney American, [place]   cephALEXin (KEFLEX) 500 MG capsule Take 1 capsule (500 mg total) by mouth 2 (two) times daily. 10 capsule Volney American, [place]      PDMP not reviewed this encounter.   Volney American, [place] 01/23/21 1928

## 2021-01-23 NOTE — ED Triage Notes (Signed)
Patient c/o hematuria and RT sided flank pain x 3 days.   Patient denies fever at home.  Patient endorses " discomfort while peeing but not burning".   Patient endorses N/V.   Patient has taken Tylenol with no relief of symptoms.

## 2021-01-26 LAB — URINE CULTURE: Culture: NO GROWTH

## 2021-09-28 ENCOUNTER — Encounter: Payer: Self-pay | Admitting: Emergency Medicine

## 2021-09-28 ENCOUNTER — Ambulatory Visit
Admission: EM | Admit: 2021-09-28 | Discharge: 2021-09-28 | Disposition: A | Discharge status: Home / Self Care | Payer: Self-pay | Attending: Internal Medicine | Admitting: Internal Medicine

## 2021-09-28 ENCOUNTER — Other Ambulatory Visit: Payer: Self-pay

## 2021-09-28 DIAGNOSIS — J020 Streptococcal pharyngitis: Secondary | ICD-10-CM

## 2021-09-28 LAB — POCT RAPID STREP A (OFFICE): Rapid Strep A Screen: POSITIVE — AB

## 2021-09-28 MED ORDER — AMOXICILLIN 500 MG PO CAPS: 500.0000 mg | ORAL_CAPSULE | Freq: Two times a day (BID) | ORAL | 0 refills | Status: AC

## 2021-09-28 NOTE — ED Provider Notes (Signed)
?[facility] ? ? ? ?CSN: [id number] ?Arrival date & time: 09/28/21  1127 ? ? ?  ? ?History   ?Chief Complaint ?Chief Complaint  ?Patient presents with  ? Sore Throat  ? ? ?HPI ?Elizabeth Dean is a 41 y.o. female.  ? ?Patient presents with sore throat and headache that has been present for 3 days.  Denies any associated upper respiratory symptoms, cough, fever.  Denies any known sick contacts.  Patient has not taken any medications to alleviate symptoms.  Denies chest pain, shortness of breath, nausea, vomiting, diarrhea, abdominal pain. ? ? ?Sore Throat ? ? ?Past Medical History:  ?Diagnosis Date  ? Anemia   ? Anxiety   ? Asthma   ? Bronchitis   ? Depression   ? GERD (gastroesophageal reflux disease)   ? Panic attacks   ? ? ?Patient Active Problem List  ? Diagnosis Date Noted  ? Hidradenitis suppurativa 11/12/2020  ? Healthcare maintenance 09/03/2015  ? Hypertriglyceridemia 04/04/2014  ? Asthma with acute exacerbation 04/04/2014  ? Need for prophylactic vaccination and inoculation against influenza 04/04/2014  ? Adjustment disorder with mixed anxiety and depressed mood 02/14/2014  ? Muscle spasm 01/02/2014  ? Major depression 01/02/2014  ? Multiple falls 01/02/2014  ? ? ?Past Surgical History:  ?Procedure Laterality Date  ? BREAST SURGERY    ? CHOLECYSTECTOMY    ? ? ?OB History   ? ? Gravida  ?6  ? Para  ?4  ? Term  ?4  ? Preterm  ?   ? AB  ?2  ? Living  ?4  ?  ? ? SAB  ?2  ? IAB  ?   ? Ectopic  ?   ? Multiple  ?   ? Live Births  ?4  ?   ?  ?  ? ? ? ?Home Medications   ? ?Prior to Admission medications   ?Medication Sig Start Date End Date Taking? Authorizing Provider  ?amoxicillin (AMOXIL) 500 MG capsule Take 1 capsule (500 mg total) by mouth 2 (two) times daily for 10 days. 09/28/21 10/08/21 Yes Teodora Medici, FNP  ?cephALEXin (KEFLEX) 500 MG capsule Take 1 capsule (500 mg total) by mouth 2 (two) times daily. 01/23/21   Volney American, PA-C  ?doxycycline (ADOXA) 100 MG tablet Take 1 tablet (100 mg  total) by mouth 2 (two) times daily. For three months 11/12/20   Anyanwu, Sallyanne Havers, MD  ?loratadine (CLARITIN) 10 MG tablet Take 10 mg by mouth daily.    [provider]  ?ondansetron (ZOFRAN ODT) 4 MG disintegrating tablet Take 1 tablet (4 mg total) by mouth every 8 (eight) hours as needed for nausea or vomiting. 01/23/21   Volney American, PA-C  ?tamsulosin (FLOMAX) 0.4 MG CAPS capsule Take 1 capsule (0.4 mg total) by mouth daily. 01/23/21   Volney American, PA-C  ? ? ?Family History ?Family History  ?Problem Relation Age of Onset  ? Heart disease Maternal Grandmother   ? Hypertension Maternal Grandmother   ? Breast cancer Mother   ? ? ?Social History ?Social History  ? ?Tobacco Use  ? Smoking status: Some Days  ?  Packs/day: 1.00  ?  Types: E-cigarettes, Cigarettes  ? Smokeless tobacco: Never  ? Tobacco comments:  ?  not every day  ?Vaping Use  ? Vaping Use: Never used  ?Substance Use Topics  ? Alcohol use: Yes  ?  Comment: socially  ? Drug use: No  ? ? ? ?Allergies   ?  Hydrocodone and Codeine ? ? ?Review of Systems ?Review of Systems ?Per HPI ? ?Physical Exam ?Triage Vital Signs ?ED Triage Vitals  ?Enc Vitals Group  ?   BP 09/28/21 1139 (!) 147/96  ?   Pulse Rate 09/28/21 1139 100  ?   Resp 09/28/21 1139 18  ?   Temp 09/28/21 1139 98.1 ?F (36.7 ?C)  ?   Temp Source 09/28/21 1139 Oral  ?   SpO2 09/28/21 1139 98 %  ?   Weight 09/28/21 1141 164 lb 14.5 oz (74.8 kg)  ?   Height 09/28/21 1141 5' (1.524 m)  ?   Head Circumference --   ?   Peak Flow --   ?   Pain Score 09/28/21 1141 8  ?   Pain Loc --   ?   Pain Edu? --   ?   Excl. in [place]? --   ? ?No data found. ? ?Updated Vital Signs ?BP (!) 147/96 (BP Location: Left Arm)   Pulse 100   Temp 98.1 ?F (36.7 ?C) (Oral)   Resp 18   Ht 5' (1.524 m)   Wt 164 lb 14.5 oz (74.8 kg)   LMP 09/13/2021   SpO2 98%   BMI 32.21 kg/m?  ? ?Visual Acuity ?Right Eye Distance:   ?Left Eye Distance:   ?Bilateral Distance:   ? ?Right Eye Near:   ?Left Eye Near:     ?Bilateral Near:    ? ?Physical Exam ?Constitutional:   ?   General: She is not in acute distress. ?   Appearance: Normal appearance. She is not toxic-appearing or diaphoretic.  ?HENT:  ?   Head: Normocephalic and atraumatic.  ?   Right Ear: Tympanic membrane and ear canal normal.  ?   Left Ear: Tympanic membrane and ear canal normal.  ?   Nose: Nose normal.  ?   Mouth/Throat:  ?   Mouth: Mucous membranes are moist.  ?   Pharynx: Posterior oropharyngeal erythema present. No oropharyngeal exudate.  ?   Tonsils: No tonsillar exudate or tonsillar abscesses.  ?Eyes:  ?   Extraocular Movements: Extraocular movements intact.  ?   Conjunctiva/sclera: Conjunctivae normal.  ?   Pupils: Pupils are equal, round, and reactive to light.  ?Cardiovascular:  ?   Rate and Rhythm: Normal rate and regular rhythm.  ?   Pulses: Normal pulses.  ?   Heart sounds: Normal heart sounds.  ?Pulmonary:  ?   Effort: Pulmonary effort is normal. No respiratory distress.  ?   Breath sounds: Normal breath sounds.  ?Abdominal:  ?   General: Abdomen is flat. Bowel sounds are normal. There is no distension.  ?   Palpations: Abdomen is soft.  ?   Tenderness: There is no abdominal tenderness.  ?Skin: ?   General: Skin is warm and dry.  ?Neurological:  ?   General: No focal deficit present.  ?   Mental Status: She is alert and oriented to person, place, and time. Mental status is at baseline.  ?Psychiatric:     ?   Mood and Affect: Mood normal.     ?   Behavior: Behavior normal.     ?   Thought Content: Thought content normal.     ?   Judgment: Judgment normal.  ? ? ? ?UC Treatments / Results  ?Labs ?(all labs ordered are listed, but only abnormal results are displayed) ?Labs Reviewed  ?POCT RAPID STREP A (OFFICE) - Abnormal; Notable for the following  components:  ?    Result Value  ? Rapid Strep A Screen Positive (*)   ? All other components within normal limits  ? ? ?EKG ? ? ?Radiology ?No results found. ? ?Procedures ?Procedures (including critical  care time) ? ?Medications Ordered in UC ?Medications - No data to display ? ?Initial Impression / Assessment and Plan / UC Course  ?I have reviewed the triage vital signs and the nursing notes. ? ?Pertinent labs & imaging results that were available during my care of the patient were reviewed by me and considered in my medical decision making (see chart for details). ? ?  ? ?Rapid strep was positive.  Will treat with amoxicillin antibiotic.  No signs of peritonsillar abscess on exam.  Discussed return precautions.  Patient verbalized understanding and was agreeable with plan. ?Final Clinical Impressions(s) / UC Diagnoses  ? ?Final diagnoses:  ?Strep pharyngitis  ? ? ? ?Discharge Instructions   ? ?  ?You have strep throat which is being treated with an antibiotic.  Please follow-up if symptoms persist or worsen. ? ? ? ?ED Prescriptions   ? ? Medication Sig Dispense Auth. Provider  ? amoxicillin (AMOXIL) 500 MG capsule Take 1 capsule (500 mg total) by mouth 2 (two) times daily for 10 days. 20 capsule Oswaldo Conroy E, [place]  ? ?  ? ?PDMP not reviewed this encounter. ?  ?Teodora Medici, [place] ?09/28/21 1201 ? ?

## 2021-09-28 NOTE — ED Triage Notes (Signed)
Patient c/o sore throat x 3 days, worse this morning.  States that her tonsils are swollen.  Denies any OTC meds. ?

## 2021-09-28 NOTE — Discharge Instructions (Signed)
You have strep throat which is being treated with an antibiotic.  Please follow-up if symptoms persist or worsen. ?

## 2021-10-02 ENCOUNTER — Ambulatory Visit
Admission: EM | Admit: 2021-10-02 | Discharge: 2021-10-02 | Disposition: A | Discharge status: Home / Self Care | Payer: Self-pay | Attending: Internal Medicine | Admitting: Internal Medicine

## 2021-10-02 ENCOUNTER — Other Ambulatory Visit: Payer: Self-pay

## 2021-10-02 DIAGNOSIS — J020 Streptococcal pharyngitis: Secondary | ICD-10-CM

## 2021-10-02 DIAGNOSIS — R52 Pain, unspecified: Secondary | ICD-10-CM

## 2021-10-02 NOTE — ED Triage Notes (Signed)
Pt states was here a few days ago, tested strep(+) and given 10 days abx. States has been taking them and now her body is sore. States "I can't go to work like this" and requests work note to cover yesterday, today, and until sxs have resolved.  ? ?Note: please verify pharmacy  ?

## 2021-10-02 NOTE — Discharge Instructions (Signed)
Continue antibiotic.  Blood work is pending.  We will call you if there are any abnormalities.  Please go to the hospital if symptoms persist or worsen. ?

## 2021-10-02 NOTE — ED Provider Notes (Signed)
?[facility] ? ? ? ?CSN: [id number] ?Arrival date & time: 10/02/21  1009 ? ? ?  ? ?History   ?Chief Complaint ?Chief Complaint  ?Patient presents with  ? work note  ? ? ?HPI ?Elizabeth Dean is a 41 y.o. female.  ? ?Patient presents for further evaluation of generalized body aches.  Patient was seen at urgent care approximately 4 to 5 days ago and tested positive for strep pharyngitis.  She was placed on amoxicillin antibiotic and is currently halfway through regimen and has been taking as prescribed.  She reports an improvement in other symptoms but developed body aches 1 to 2 days ago that are generalized.  Denies any associated fevers.  Denies any changes to urine.  Denies any rashes.  Patient reports that she is simply here for a work note for the next few days as she does not feel good. ? ? ? ?Past Medical History:  ?Diagnosis Date  ? Anemia   ? Anxiety   ? Asthma   ? Bronchitis   ? Depression   ? GERD (gastroesophageal reflux disease)   ? Panic attacks   ? ? ?Patient Active Problem List  ? Diagnosis Date Noted  ? Hidradenitis suppurativa 11/12/2020  ? Healthcare maintenance 09/03/2015  ? Hypertriglyceridemia 04/04/2014  ? Asthma with acute exacerbation 04/04/2014  ? Need for prophylactic vaccination and inoculation against influenza 04/04/2014  ? Adjustment disorder with mixed anxiety and depressed mood 02/14/2014  ? Muscle spasm 01/02/2014  ? Major depression 01/02/2014  ? Multiple falls 01/02/2014  ? ? ?Past Surgical History:  ?Procedure Laterality Date  ? BREAST SURGERY    ? CHOLECYSTECTOMY    ? ? ?OB History   ? ? Gravida  ?6  ? Para  ?4  ? Term  ?4  ? Preterm  ?   ? AB  ?2  ? Living  ?4  ?  ? ? SAB  ?2  ? IAB  ?   ? Ectopic  ?   ? Multiple  ?   ? Live Births  ?4  ?   ?  ?  ? ? ? ?Home Medications   ? ?Prior to Admission medications   ?Medication Sig Start Date End Date Taking? Authorizing Provider  ?amoxicillin (AMOXIL) 500 MG capsule Take 1 capsule (500 mg total) by mouth 2 (two) times daily  for 10 days. 09/28/21 10/08/21  Teodora Medici, FNP  ?cephALEXin (KEFLEX) 500 MG capsule Take 1 capsule (500 mg total) by mouth 2 (two) times daily. 01/23/21   Volney American, PA-C  ?doxycycline (ADOXA) 100 MG tablet Take 1 tablet (100 mg total) by mouth 2 (two) times daily. For three months 11/12/20   Anyanwu, Sallyanne Havers, MD  ?loratadine (CLARITIN) 10 MG tablet Take 10 mg by mouth daily.    [provider]  ?ondansetron (ZOFRAN ODT) 4 MG disintegrating tablet Take 1 tablet (4 mg total) by mouth every 8 (eight) hours as needed for nausea or vomiting. 01/23/21   Volney American, PA-C  ?tamsulosin (FLOMAX) 0.4 MG CAPS capsule Take 1 capsule (0.4 mg total) by mouth daily. 01/23/21   Volney American, PA-C  ? ? ?Family History ?Family History  ?Problem Relation Age of Onset  ? Heart disease Maternal Grandmother   ? Hypertension Maternal Grandmother   ? Breast cancer Mother   ? ? ?Social History ?Social History  ? ?Tobacco Use  ? Smoking status: Some Days  ?  Packs/day: 1.00  ?  Types: E-cigarettes, Cigarettes  ? Smokeless tobacco: Never  ? Tobacco comments:  ?  not every day  ?Vaping Use  ? Vaping Use: Never used  ?Substance Use Topics  ? Alcohol use: Yes  ?  Comment: socially  ? Drug use: No  ? ? ? ?Allergies   ?Hydrocodone and Codeine ? ? ?Review of Systems ?Review of Systems ?Per HPI ? ?Physical Exam ?Triage Vital Signs ?ED Triage Vitals  ?Enc Vitals Group  ?   BP 10/02/21 1050 (!) 154/108  ?   Pulse Rate 10/02/21 1050 80  ?   Resp 10/02/21 1050 18  ?   Temp 10/02/21 1050 97.8 ?F (36.6 ?C)  ?   Temp Source 10/02/21 1050 Oral  ?   SpO2 10/02/21 1050 95 %  ?   Weight --   ?   Height --   ?   Head Circumference --   ?   Peak Flow --   ?   Pain Score 10/02/21 1051 0  ?   Pain Loc --   ?   Pain Edu? --   ?   Excl. in [place]? --   ? ?No data found. ? ?Updated Vital Signs ?BP (!) 154/108   Pulse 80   Temp 97.8 ?F (36.6 ?C) (Oral)   Resp 18   LMP 09/13/2021   SpO2 95%  ? ?Visual Acuity ?Right Eye  Distance:   ?Left Eye Distance:   ?Bilateral Distance:   ? ?Right Eye Near:   ?Left Eye Near:    ?Bilateral Near:    ? ?Physical Exam ?Constitutional:   ?   General: She is not in acute distress. ?   Appearance: Normal appearance. She is not toxic-appearing or diaphoretic.  ?HENT:  ?   Head: Normocephalic and atraumatic.  ?   Right Ear: Tympanic membrane and ear canal normal.  ?   Left Ear: Tympanic membrane and ear canal normal.  ?   Nose: Congestion present.  ?   Mouth/Throat:  ?   Mouth: Mucous membranes are moist.  ?   Pharynx: Posterior oropharyngeal erythema present.  ?   Tonsils: No tonsillar abscesses.  ?Eyes:  ?   Extraocular Movements: Extraocular movements intact.  ?   Conjunctiva/sclera: Conjunctivae normal.  ?   Pupils: Pupils are equal, round, and reactive to light.  ?Cardiovascular:  ?   Rate and Rhythm: Normal rate and regular rhythm.  ?   Pulses: Normal pulses.  ?   Heart sounds: Normal heart sounds.  ?Pulmonary:  ?   Effort: Pulmonary effort is normal. No respiratory distress.  ?   Breath sounds: Normal breath sounds. No stridor. No wheezing, rhonchi or rales.  ?Abdominal:  ?   General: Abdomen is flat. Bowel sounds are normal.  ?   Palpations: Abdomen is soft.  ?Musculoskeletal:     ?   General: Normal range of motion.  ?   Cervical back: Normal range of motion.  ?Skin: ?   General: Skin is warm and dry.  ?Neurological:  ?   General: No focal deficit present.  ?   Mental Status: She is alert and oriented to person, place, and time. Mental status is at baseline.  ?Psychiatric:     ?   Mood and Affect: Mood normal.     ?   Behavior: Behavior normal.  ? ? ? ?UC Treatments / Results  ?Labs ?(all labs ordered are listed, but only abnormal results are displayed) ?Labs Reviewed  ?COMPREHENSIVE METABOLIC  PANEL  ?CBC  ? ? ?EKG ? ? ?Radiology ?No results found. ? ?Procedures ?Procedures (including critical care time) ? ?Medications Ordered in UC ?Medications - No data to display ? ?Initial Impression /  Assessment and Plan / UC Course  ?I have reviewed the triage vital signs and the nursing notes. ? ?Pertinent labs & imaging results that were available during my care of the patient were reviewed by me and considered in my medical decision making (see chart for details). ? ?  ? ?Patient's symptoms appear to be improving as far as acute tonsillitis and posterior pharynx.  There is a mild concern for complications related to strep pharyngitis given new onset generalized body aches. Although, low suspicion. Suspect body aches are simply related to strep pharynigitis. Will ensure that there is no glomerulonephritis with blood work today.  CMP and CBC pending.  Continue and complete amoxicillin antibiotic.  Discussed strict return and ER precautions.  Patient verbalized understanding and was agreeable with plan. ?Final Clinical Impressions(s) / UC Diagnoses  ? ?Final diagnoses:  ?Strep pharyngitis  ?Generalized body aches  ? ? ? ?Discharge Instructions   ? ?  ?Continue antibiotic.  Blood work is pending.  We will call you if there are any abnormalities.  Please go to the hospital if symptoms persist or worsen. ? ? ? ?ED Prescriptions   ?None ?  ? ?PDMP not reviewed this encounter. ?  ?Teodora Medici, [place] ?10/02/21 1144 ? ?

## 2021-10-03 LAB — CBC
Hematocrit: 38.5 % (ref 34.0–46.6)
Hemoglobin: 11.8 g/dL (ref 11.1–15.9)
MCH: 24.5 pg — ABNORMAL LOW (ref 26.6–33.0)
MCHC: 30.6 g/dL — ABNORMAL LOW (ref 31.5–35.7)
MCV: 80 fL (ref 79–97)
Platelets: 374 10*3/uL (ref 150–450)
RBC: 4.81 x10E6/uL (ref 3.77–5.28)
RDW: 16.2 % — ABNORMAL HIGH (ref 11.7–15.4)
WBC: 10.8 10*3/uL (ref 3.4–10.8)

## 2021-10-03 LAB — COMPREHENSIVE METABOLIC PANEL
ALT: 247 IU/L — ABNORMAL HIGH (ref 0–32)
AST: 141 IU/L — ABNORMAL HIGH (ref 0–40)
Albumin/Globulin Ratio: 1.3 (ref 1.2–2.2)
Albumin: 4.1 g/dL (ref 3.8–4.8)
Alkaline Phosphatase: 177 IU/L — ABNORMAL HIGH (ref 44–121)
BUN/Creatinine Ratio: 14 (ref 9–23)
BUN: 8 mg/dL (ref 6–24)
Bilirubin Total: <0.2 mg/dL (ref 0.0–1.2)
CO2: 24 mmol/L (ref 20–29)
Calcium: 9.9 mg/dL (ref 8.7–10.2)
Chloride: 102 mmol/L (ref 96–106)
Creatinine, Ser: 0.59 mg/dL (ref 0.57–1.00)
Globulin, Total: 3.1 g/dL (ref 1.5–4.5)
Glucose: 75 mg/dL (ref 70–99)
Potassium: 4.4 mmol/L (ref 3.5–5.2)
Sodium: 138 mmol/L (ref 134–144)
Total Protein: 7.2 g/dL (ref 6.0–8.5)
eGFR: 116 mL/min/{1.73_m2} (ref >59)

## 2021-10-09 ENCOUNTER — Ambulatory Visit
Admission: EM | Admit: 2021-10-09 | Discharge: 2021-10-09 | Disposition: A | Discharge status: Home / Self Care | Payer: Self-pay | Attending: Physician Assistant | Admitting: Physician Assistant

## 2021-10-09 ENCOUNTER — Other Ambulatory Visit: Payer: Self-pay

## 2021-10-09 ENCOUNTER — Encounter: Payer: Self-pay | Admitting: Emergency Medicine

## 2021-10-09 ENCOUNTER — Ambulatory Visit (INDEPENDENT_AMBULATORY_CARE_PROVIDER_SITE_OTHER): Payer: Self-pay

## 2021-10-09 DIAGNOSIS — M25561 Pain in right knee: Secondary | ICD-10-CM

## 2021-10-09 MED ORDER — PREDNISONE 20 MG PO TABS: 40.0000 mg | ORAL_TABLET | Freq: Every day | ORAL | 0 refills | Status: AC

## 2021-10-09 NOTE — ED Triage Notes (Signed)
Pt is present today with right knee swelling. Pt states that she noticed the swelling 3 days ago. Pt denies any injury ?

## 2021-10-09 NOTE — ED Provider Notes (Signed)
?[facility] ? ? ? ?CSN: [id number] ?Arrival date & time: 10/09/21  1217 ? ? ?  ? ?History   ?Chief Complaint ?Chief Complaint  ?Patient presents with  ? Joint Swelling  ? ? ?HPI ?Elizabeth Dean is a 41 y.o. female.  ? ?Patient here today for evaluation of right knee swelling she first noticed about 3 days ago. She denies pain except with weight bearing and walking. She has not had any known injury. She denies any numbness or tingling. She has not tried any treatment for symptoms. ? ?The history is provided by the patient.  ? ?Past Medical History:  ?Diagnosis Date  ? Anemia   ? Anxiety   ? Asthma   ? Bronchitis   ? Depression   ? GERD (gastroesophageal reflux disease)   ? Panic attacks   ? ? ?Patient Active Problem List  ? Diagnosis Date Noted  ? Hidradenitis suppurativa 11/12/2020  ? Healthcare maintenance 09/03/2015  ? Hypertriglyceridemia 04/04/2014  ? Asthma with acute exacerbation 04/04/2014  ? Need for prophylactic vaccination and inoculation against influenza 04/04/2014  ? Adjustment disorder with mixed anxiety and depressed mood 02/14/2014  ? Muscle spasm 01/02/2014  ? Major depression 01/02/2014  ? Multiple falls 01/02/2014  ? ? ?Past Surgical History:  ?Procedure Laterality Date  ? BREAST SURGERY    ? CHOLECYSTECTOMY    ? ? ?OB History   ? ? Gravida  ?6  ? Para  ?4  ? Term  ?4  ? Preterm  ?   ? AB  ?2  ? Living  ?4  ?  ? ? SAB  ?2  ? IAB  ?   ? Ectopic  ?   ? Multiple  ?   ? Live Births  ?4  ?   ?  ?  ? ? ? ?Home Medications   ? ?Prior to Admission medications   ?Medication Sig Start Date End Date Taking? Authorizing Provider  ?predniSONE (DELTASONE) 20 MG tablet Take 2 tablets (40 mg total) by mouth daily with breakfast for 5 days. 10/09/21 10/14/21 Yes Francene Finders, PA-C  ?cephALEXin (KEFLEX) 500 MG capsule Take 1 capsule (500 mg total) by mouth 2 (two) times daily. 01/23/21   Volney American, PA-C  ?doxycycline (ADOXA) 100 MG tablet Take 1 tablet (100 mg total) by mouth 2 (two) times  daily. For three months 11/12/20   Anyanwu, Sallyanne Havers, MD  ?loratadine (CLARITIN) 10 MG tablet Take 10 mg by mouth daily.    [provider]  ?ondansetron (ZOFRAN ODT) 4 MG disintegrating tablet Take 1 tablet (4 mg total) by mouth every 8 (eight) hours as needed for nausea or vomiting. 01/23/21   Volney American, PA-C  ?tamsulosin (FLOMAX) 0.4 MG CAPS capsule Take 1 capsule (0.4 mg total) by mouth daily. 01/23/21   Volney American, PA-C  ? ? ?Family History ?Family History  ?Problem Relation Age of Onset  ? Heart disease Maternal Grandmother   ? Hypertension Maternal Grandmother   ? Breast cancer Mother   ? ? ?Social History ?Social History  ? ?Tobacco Use  ? Smoking status: Some Days  ?  Packs/day: 1.00  ?  Types: E-cigarettes, Cigarettes  ? Smokeless tobacco: Never  ? Tobacco comments:  ?  not every day  ?Vaping Use  ? Vaping Use: Never used  ?Substance Use Topics  ? Alcohol use: Yes  ?  Comment: socially  ? Drug use: No  ? ? ? ?Allergies   ?  Hydrocodone and Codeine ? ? ?Review of Systems ?Review of Systems  ?Constitutional:  Negative for chills and fever.  ?Eyes:  Negative for discharge and redness.  ?Gastrointestinal:  Negative for abdominal pain, nausea and vomiting.  ?Musculoskeletal:  Positive for arthralgias and joint swelling.  ? ? ?Physical Exam ?Triage Vital Signs ?ED Triage Vitals [10/09/21 1406]  ?Enc Vitals Group  ?   BP   ?   Pulse   ?   Resp   ?   Temp   ?   Temp src   ?   SpO2   ?   Weight   ?   Height   ?   Head Circumference   ?   Peak Flow   ?   Pain Score 7  ?   Pain Loc   ?   Pain Edu?   ?   Excl. in [place]?   ? ?No data found. ? ?Updated Vital Signs ?BP (!) 142/95   Pulse 80   Temp 97.8 ?F (36.6 ?C)   Resp 18   LMP 09/13/2021   SpO2 98%  ?   ? ?Physical Exam ?Vitals and nursing note reviewed.  ?Constitutional:   ?   General: She is not in acute distress. ?   Appearance: Normal appearance. She is not ill-appearing.  ?HENT:  ?   Head: Normocephalic and atraumatic.  ?Eyes:  ?    Conjunctiva/sclera: Conjunctivae normal.  ?Cardiovascular:  ?   Rate and Rhythm: Normal rate.  ?Pulmonary:  ?   Effort: Pulmonary effort is normal.  ?Musculoskeletal:  ?   Comments: Full extension with decreased flexion of right knee, no erythema, minimal diffuse swelling to anterior right knee, No TTP to right knee, no lower leg swelling  ?Neurological:  ?   Mental Status: She is alert.  ?Psychiatric:     ?   Mood and Affect: Mood normal.     ?   Behavior: Behavior normal.     ?   Thought Content: Thought content normal.  ? ? ? ?UC Treatments / Results  ?Labs ?(all labs ordered are listed, but only abnormal results are displayed) ?Labs Reviewed - No data to display ? ?EKG ? ? ?Radiology ?DG Knee Complete 4 Views Right ? ?Result Date: 10/09/2021 ?CLINICAL DATA:  Right knee swelling for 3 days. Patient denies injury. EXAM: RIGHT KNEE - COMPLETE 4+ VIEW COMPARISON:  Radiograph 02/06/2019 FINDINGS: No evidence of fracture, dislocation, or joint effusion. No evidence of arthropathy or other focal bone abnormality. Soft tissues are unremarkable. IMPRESSION: Negative. Electronically Signed   By: Ileana Roup M.D.   On: 10/09/2021 14:37   ? ?Procedures ?Procedures (including critical care time) ? ?Medications Ordered in UC ?Medications - No data to display ? ?Initial Impression / Assessment and Plan / UC Course  ?I have reviewed the triage vital signs and the nursing notes. ? ?Pertinent labs & imaging results that were available during my care of the patient were reviewed by me and considered in my medical decision making (see chart for details). ? ?  ?Xray without concerning findings. Will treat with steroid burst and recommended follow up with ortho if symptoms persist. Patient expresses understanding.  ? ?Final Clinical Impressions(s) / UC Diagnoses  ? ?Final diagnoses:  ?Acute pain of right knee  ? ?Discharge Instructions   ?None ?  ? ?ED Prescriptions   ? ? Medication Sig Dispense Auth. Provider  ? predniSONE  (DELTASONE) 20 MG tablet Take 2 tablets (40 mg  total) by mouth daily with breakfast for 5 days. 10 tablet Francene Finders, PA-C  ? ?  ? ?PDMP not reviewed this encounter. ?  ?Francene Finders, PA-C ?10/09/21 1718 ? ?

## 2021-10-11 ENCOUNTER — Ambulatory Visit
Admission: EM | Admit: 2021-10-11 | Discharge: 2021-10-11 | Disposition: A | Discharge status: Home / Self Care | Payer: Self-pay

## 2021-10-11 DIAGNOSIS — M25561 Pain in right knee: Secondary | ICD-10-CM

## 2021-10-11 NOTE — ED Triage Notes (Signed)
Pt was recently seen at this uc for knee pain. States provider note for work was only good for 2 days and she needs coverage for today and tomorrow since knee is still swollen and painful.  ?

## 2021-10-11 NOTE — ED Provider Notes (Signed)
?[facility] ? ? ? ?CSN: [id number] ?Arrival date & time: 10/11/21  1430 ? ? ?  ? ?History   ?Chief Complaint ?Chief Complaint  ?Patient presents with  ? work note  ? ? ?HPI ?Elizabeth Dean is a 41 y.o. female.  ? ?Patient presents today for reevaluation of right knee pain.  She was seen back clinic on 10/09/2021 at which point she reported a several day history of right knee pain without known injury or increase in activity.  X-ray was obtained that was normal.  She was started on prednisone burst which she has been taking with improvement but not resolution of symptoms.  She continues to have some mild pain which is rated 5 on a 0-10 pain scale, described as aching, localized to lateral inferior joint line, worse with prolonged ambulation.  She has been using ice as well as a Ace bandage for additional symptom relief.  She reports having surgery on this knee when she was a child but denies any additional injury or surgery.  She denies any swelling, weakness, numbness, inability to ascend/descend stairs, popping, clicking, instability.  She was given work excuse note that expires today and is requesting this be extended for several days as pain has not yet resolved.  She was given contact information for an orthopedic provider but has not yet scheduled an appointment. ? ? ?Past Medical History:  ?Diagnosis Date  ? Anemia   ? Anxiety   ? Asthma   ? Bronchitis   ? Depression   ? GERD (gastroesophageal reflux disease)   ? Panic attacks   ? ? ?Patient Active Problem List  ? Diagnosis Date Noted  ? Hidradenitis suppurativa 11/12/2020  ? Healthcare maintenance 09/03/2015  ? Hypertriglyceridemia 04/04/2014  ? Asthma with acute exacerbation 04/04/2014  ? Need for prophylactic vaccination and inoculation against influenza 04/04/2014  ? Adjustment disorder with mixed anxiety and depressed mood 02/14/2014  ? Muscle spasm 01/02/2014  ? Major depression 01/02/2014  ? Multiple falls 01/02/2014  ? ? ?Past Surgical  History:  ?Procedure Laterality Date  ? BREAST SURGERY    ? CHOLECYSTECTOMY    ? ? ?OB History   ? ? Gravida  ?6  ? Para  ?4  ? Term  ?4  ? Preterm  ?   ? AB  ?2  ? Living  ?4  ?  ? ? SAB  ?2  ? IAB  ?   ? Ectopic  ?   ? Multiple  ?   ? Live Births  ?4  ?   ?  ?  ? ? ? ?Home Medications   ? ?Prior to Admission medications   ?Medication Sig Start Date End Date Taking? Authorizing Provider  ?loratadine (CLARITIN) 10 MG tablet Take 10 mg by mouth daily.    [provider]  ?predniSONE (DELTASONE) 20 MG tablet Take 2 tablets (40 mg total) by mouth daily with breakfast for 5 days. 10/09/21 10/14/21  Francene Finders, PA-C  ? ? ?Family History ?Family History  ?Problem Relation Age of Onset  ? Heart disease Maternal Grandmother   ? Hypertension Maternal Grandmother   ? Breast cancer Mother   ? ? ?Social History ?Social History  ? ?Tobacco Use  ? Smoking status: Some Days  ?  Packs/day: 1.00  ?  Types: E-cigarettes, Cigarettes  ? Smokeless tobacco: Never  ? Tobacco comments:  ?  not every day  ?Vaping Use  ? Vaping Use: Never used  ?Substance Use Topics  ?  Alcohol use: Yes  ?  Comment: socially  ? Drug use: No  ? ? ? ?Allergies   ?Hydrocodone and Codeine ? ? ?Review of Systems ?Review of Systems  ?Constitutional:  Positive for activity change. Negative for appetite change, fatigue and fever.  ?Respiratory:  Negative for cough and shortness of breath.   ?Cardiovascular:  Negative for chest pain and leg swelling.  ?Musculoskeletal:  Positive for arthralgias, gait problem and joint swelling. Negative for myalgias.  ?Neurological:  Negative for weakness, light-headedness, numbness and headaches.  ? ? ?Physical Exam ?Triage Vital Signs ?ED Triage Vitals  ?Enc Vitals Group  ?   BP 10/11/21 1513 (!) 149/96  ?   Pulse Rate 10/11/21 1513 79  ?   Resp 10/11/21 1513 18  ?   Temp 10/11/21 1513 98.1 ?F (36.7 ?C)  ?   Temp Source 10/11/21 1513 Oral  ?   SpO2 10/11/21 1513 98 %  ?   Weight --   ?   Height --   ?   Head  Circumference --   ?   Peak Flow --   ?   Pain Score 10/11/21 1514 0  ?   Pain Loc --   ?   Pain Edu? --   ?   Excl. in [place]? --   ? ?No data found. ? ?Updated Vital Signs ?BP (!) 149/96 (BP Location: Left Arm)   Pulse 79   Temp 98.1 ?F (36.7 ?C) (Oral)   Resp 18   LMP 09/13/2021   SpO2 98%  ? ?Visual Acuity ?Right Eye Distance:   ?Left Eye Distance:   ?Bilateral Distance:   ? ?Right Eye Near:   ?Left Eye Near:    ?Bilateral Near:    ? ?Physical Exam ?Vitals reviewed.  ?Constitutional:   ?   General: She is awake. She is not in acute distress. ?   Appearance: Normal appearance. She is well-developed. She is not ill-appearing.  ?   Comments: Very pleasant female appears stated age in no acute distress sitting comfortably in exam room  ?HENT:  ?   Head: Normocephalic and atraumatic.  ?Cardiovascular:  ?   Rate and Rhythm: Normal rate and regular rhythm.  ?   Pulses:     ?     Posterior tibial pulses are 2+ on the left side.  ?   Heart sounds: Normal heart sounds and S1 normal. No murmur heard. ?Pulmonary:  ?   Effort: Pulmonary effort is normal.  ?   Breath sounds: Normal breath sounds. No wheezing, rhonchi or rales.  ?   Comments: Clear to auscultation bilaterally ?Musculoskeletal:  ?   Right knee: No swelling, effusion or bony tenderness. Normal range of motion. Tenderness present over the lateral joint line. No LCL laxity, MCL laxity, ACL laxity or PCL laxity.  ?   Instability Tests: Anterior drawer test negative. Posterior drawer test negative.  ?   Right lower leg: No edema.  ?   Left lower leg: No edema.  ?   Comments: Right knee: Mild tenderness palpation at right inferior joint line.  No deformity noted.  No effusion or swelling noted.  Normal active range of motion.  No ligamentous laxity on exam.  Strength 5/5.  ?Psychiatric:     ?   Behavior: Behavior is cooperative.  ? ? ? ?UC Treatments / Results  ?Labs ?(all labs ordered are listed, but only abnormal results are displayed) ?Labs Reviewed - No data to  display ? ?EKG ? ? ?  Radiology ?No results found. ? ?Procedures ?Procedures (including critical care time) ? ?Medications Ordered in UC ?Medications - No data to display ? ?Initial Impression / Assessment and Plan / UC Course  ?I have reviewed the triage vital signs and the nursing notes. ? ?Pertinent labs & imaging results that were available during my care of the patient were reviewed by me and considered in my medical decision making (see chart for details). ? ?  ? ?Patient reports improvement of symptoms.  She was encouraged to complete course of prednisone as previously prescribed.  Recommended conservative treatment measures including RICE protocol.  Discussed that if symptoms or not improving she should follow-up with orthopedics and was given contact information for local provider.  She was provided additional work note until 10/13/2021 per her request.  Discussed that if anything worsen she is to return for reevaluation.  Strict return precautions given to which she expressed understanding. ? ?Final Clinical Impressions(s) / UC Diagnoses  ? ?Final diagnoses:  ?Acute pain of right knee  ? ? ? ?Discharge Instructions   ? ?  ?Complete course of prednisone as previously prescribed.  Keep your knee elevated and use ice and compression for additional symptom relief.  Avoid strenuous activities.  If your symptoms recur or worsen in any way you need to contact orthopedics to schedule an appointment. ? ? ? ? ?ED Prescriptions   ?None ?  ? ?PDMP not reviewed this encounter. ?  ?Terrilee Croak, PA-C ?10/11/21 1540 ? ?

## 2021-10-11 NOTE — Discharge Instructions (Signed)
Complete course of prednisone as previously prescribed.  Keep your knee elevated and use ice and compression for additional symptom relief.  Avoid strenuous activities.  If your symptoms recur or worsen in any way you need to contact orthopedics to schedule an appointment. ?

## 2021-10-15 ENCOUNTER — Other Ambulatory Visit: Payer: Self-pay

## 2021-10-15 DIAGNOSIS — Z1231 Encounter for screening mammogram for malignant neoplasm of breast: Secondary | ICD-10-CM

## 2021-10-22 ENCOUNTER — Ambulatory Visit: Payer: Self-pay | Admitting: Family Medicine

## 2021-10-23 NOTE — Addendum Note (Signed)
Addended by: Demetrius Revel on: 10/23/2021 10:42 AM ? ? Modules accepted: Orders ? ?

## 2021-11-14 ENCOUNTER — Ambulatory Visit
Admission: RE | Admit: 2021-11-14 | Discharge: 2021-11-14 | Disposition: A | Discharge status: Home / Self Care | Payer: No Typology Code available for payment source | Source: Ambulatory Visit | Attending: Family Medicine | Admitting: Family Medicine

## 2021-11-14 DIAGNOSIS — Z1231 Encounter for screening mammogram for malignant neoplasm of breast: Secondary | ICD-10-CM

## 2021-11-18 ENCOUNTER — Other Ambulatory Visit: Payer: Self-pay | Admitting: Family Medicine

## 2021-11-18 DIAGNOSIS — R928 Other abnormal and inconclusive findings on diagnostic imaging of breast: Secondary | ICD-10-CM

## 2021-11-26 ENCOUNTER — Telehealth: Payer: Self-pay

## 2021-11-26 NOTE — Telephone Encounter (Signed)
Telephoned patient at mobile number. Phone number unavailable at this time. ?

## 2021-12-25 ENCOUNTER — Ambulatory Visit
Admission: EM | Admit: 2021-12-25 | Discharge: 2021-12-25 | Disposition: A | Discharge status: Home / Self Care | Payer: No Typology Code available for payment source | Attending: Internal Medicine | Admitting: Internal Medicine

## 2021-12-25 DIAGNOSIS — B349 Viral infection, unspecified: Secondary | ICD-10-CM

## 2021-12-25 DIAGNOSIS — Z113 Encounter for screening for infections with a predominantly sexual mode of transmission: Secondary | ICD-10-CM

## 2021-12-25 DIAGNOSIS — N3001 Acute cystitis with hematuria: Secondary | ICD-10-CM

## 2021-12-25 DIAGNOSIS — N926 Irregular menstruation, unspecified: Secondary | ICD-10-CM

## 2021-12-25 LAB — POCT URINALYSIS DIP (MANUAL ENTRY)
Bilirubin, UA: NEGATIVE
Glucose, UA: NEGATIVE mg/dL
Ketones, POC UA: NEGATIVE mg/dL
Nitrite, UA: NEGATIVE
Protein Ur, POC: NEGATIVE mg/dL
Spec Grav, UA: 1.02 (ref 1.010–1.025)
Urobilinogen, UA: 0.2 E.U./dL
pH, UA: 6 (ref 5.0–8.0)

## 2021-12-25 LAB — POCT URINE PREGNANCY: Preg Test, Ur: NEGATIVE

## 2021-12-25 MED ORDER — ONDANSETRON 4 MG PO TBDP: 4.0000 mg | ORAL_TABLET | Freq: Three times a day (TID) | ORAL | 0 refills | Status: DC | PRN

## 2021-12-25 MED ORDER — CEPHALEXIN 500 MG PO CAPS: 500.0000 mg | ORAL_CAPSULE | Freq: Four times a day (QID) | ORAL | 0 refills | Status: DC

## 2021-12-25 NOTE — ED Provider Notes (Addendum)
[name]    CSN: [id number] Arrival date & time: 12/25/21  1107      History   Chief Complaint No chief complaint on file.   HPI Elizabeth Dean is a 41 y.o. female.   Patient presents with nausea, vomiting, diarrhea, decreased appetite that has been present for approximately 2 days.  Patient also endorses some nasal congestion that started around the same time that symptoms started.  Denies blood in stool or emesis.  Patient reports that she has been drinking water but has not been able to keep any food or fluids down.  Denies any known sick contacts or fever.  Patient also complaining of right lower back pain and generalized upper back pain that started at the same time as other symptoms.  Denies any injury. Denies any urinary symptoms including urinary burning, urinary frequency, vaginal discharge, abdominal pain.  Denies any exposure to STD.  Patient is also concerned for possible pregnancy given that she is not sure when her last menstrual cycle was.  She typically has monthly menstrual cycles.    Past Medical History:  Diagnosis Date   Anemia    Anxiety    Asthma    Bronchitis    Depression    GERD (gastroesophageal reflux disease)    Panic attacks     Patient Active Problem List   Diagnosis Date Noted   Hidradenitis suppurativa 11/12/2020   Healthcare maintenance 09/03/2015   Hypertriglyceridemia 04/04/2014   Asthma with acute exacerbation 04/04/2014   Need for prophylactic vaccination and inoculation against influenza 04/04/2014   Adjustment disorder with mixed anxiety and depressed mood 02/14/2014   Muscle spasm 01/02/2014   Major depression 01/02/2014   Multiple falls 01/02/2014    Past Surgical History:  Procedure Laterality Date   BREAST SURGERY     CHOLECYSTECTOMY      OB History     Gravida  6   Para  4   Term  4   Preterm      AB  2   Living  4      SAB  2   IAB      Ectopic      Multiple      Live Births  4             Home Medications    Prior to Admission medications   Medication Sig Start Date End Date Taking? Authorizing Provider  cephALEXin (KEFLEX) 500 MG capsule Take 1 capsule (500 mg total) by mouth 4 (four) times daily. 12/25/21  Yes Arthuro Canelo, Hildred Alamin E, FNP  ondansetron (ZOFRAN-ODT) 4 MG disintegrating tablet Take 1 tablet (4 mg total) by mouth every 8 (eight) hours as needed for nausea or vomiting. 12/25/21  Yes Ting Cage, Hildred Alamin E, FNP  loratadine (CLARITIN) 10 MG tablet Take 10 mg by mouth daily.    [provider]    Family History Family History  Problem Relation Age of Onset   Heart disease Maternal Grandmother    Hypertension Maternal Grandmother    Breast cancer Mother     Social History Social History   Tobacco Use   Smoking status: Some Days    Packs/day: 1.00    Types: E-cigarettes, Cigarettes   Smokeless tobacco: Never   Tobacco comments:    not every day  Vaping Use   Vaping Use: Never used  Substance Use Topics   Alcohol use: Yes    Comment: socially   Drug use: No  Allergies   Hydrocodone and Codeine   Review of Systems Review of Systems Per HPI  Physical Exam Triage Vital Signs ED Triage Vitals  Enc Vitals Group     BP 12/25/21 1235 126/85     Pulse Rate 12/25/21 1235 79     Resp 12/25/21 1235 18     Temp 12/25/21 1235 98.3 F (36.8 C)     Temp Source 12/25/21 1235 Oral     SpO2 12/25/21 1235 97 %     Weight --      Height --      Head Circumference --      Peak Flow --      Pain Score 12/25/21 1233 7     Pain Loc --      Pain Edu? --      Excl. in [place]? --    No data found.  Updated Vital Signs BP 126/85 (BP Location: Left Arm)   Pulse 79   Temp 98.3 F (36.8 C) (Oral)   Resp 18   LMP  (LMP Unknown)   SpO2 97%   Visual Acuity Right Eye Distance:   Left Eye Distance:   Bilateral Distance:    Right Eye Near:   Left Eye Near:    Bilateral Near:     Physical Exam Constitutional:      General: She is not in acute  distress.    Appearance: Normal appearance. She is not toxic-appearing or diaphoretic.  HENT:     Head: Normocephalic and atraumatic.     Right Ear: Tympanic membrane and ear canal normal.     Left Ear: Tympanic membrane and ear canal normal.     Nose: Congestion present.     Mouth/Throat:     Mouth: Mucous membranes are moist.     Pharynx: No posterior oropharyngeal erythema.  Eyes:     Extraocular Movements: Extraocular movements intact.     Conjunctiva/sclera: Conjunctivae normal.     Pupils: Pupils are equal, round, and reactive to light.  Cardiovascular:     Rate and Rhythm: Normal rate and regular rhythm.     Pulses: Normal pulses.     Heart sounds: Normal heart sounds.  Pulmonary:     Effort: Pulmonary effort is normal. No respiratory distress.     Breath sounds: Normal breath sounds. No stridor. No wheezing, rhonchi or rales.  Abdominal:     General: Abdomen is flat. Bowel sounds are normal. There is no distension.     Palpations: Abdomen is soft.     Tenderness: There is no abdominal tenderness.  Musculoskeletal:        General: Normal range of motion.     Cervical back: Normal range of motion.  Skin:    General: Skin is warm and dry.  Neurological:     General: No focal deficit present.     Mental Status: She is alert and oriented to person, place, and time. Mental status is at baseline.  Psychiatric:        Mood and Affect: Mood normal.        Behavior: Behavior normal.     UC Treatments / Results  Labs (all labs ordered are listed, but only abnormal results are displayed) Labs Reviewed  POCT URINALYSIS DIP (MANUAL ENTRY) - Abnormal; Notable for the following components:      Result Value   Blood, UA trace-intact (*)    Leukocytes, UA Moderate (2+) (*)    All other components within normal limits  URINE CULTURE  POCT URINE PREGNANCY  CERVICOVAGINAL ANCILLARY ONLY    EKG   Radiology No results found.  Procedures Procedures (including critical care  time)  Medications Ordered in UC Medications - No data to display  Initial Impression / Assessment and Plan / UC Course  I have reviewed the triage vital signs and the nursing notes.  Pertinent labs & imaging results that were available during my care of the patient were reviewed by me and considered in my medical decision making (see chart for details).     Suspect nausea, vomiting, diarrhea, nasal congestion is related to a viral illness.  Urine pregnancy was negative.  Urinalysis showing moderate leukocytes which could indicate urinary tract infection.  Back pain may be related to UTI or generalized body aches.  Will treat cephalexin antibiotic for urinary tract infection.  Urine culture is pending.  Patient's diarrhea could also be cause of patient's UTI etiology. Do not think that imaging of the abdomen is necessary at this time.  Cervicovaginal swab pending to determine if this could be cause of missed menses.  Ondansetron prescribed to take as needed for nausea.  No signs of dehydration on exam.  Patient encouraged to ensure adequate fluid hydration.  Patient advised to go to the hospital if symptoms persist or worsen or if unable to keep fluid down.  Attempted to get quantitative hCG given missed menstrual cycle and negative urine pregnancy test.  Staff was unsuccessful and patient stated that she "had to leave" so no further attempts were made.  Discussed return precautions.  Patient verbalized understanding and was agreeable with plan. Final Clinical Impressions(s) / UC Diagnoses   Final diagnoses:  Viral illness  Acute cystitis with hematuria  Screening examination for venereal disease  Missed menses     Discharge Instructions      It appears that you are having a urinary tract infection which is being treated with antibiotics.  It also appears that you have a viral illness that should run its course.  A nausea medication has been prescribed to help treat this.  Please ensure  adequate fluid hydration.  Vaginal swab and urine culture pending.  Blood work for pregnancy is pending as well.  We will call if there are any abnormalities.  Please refrain from sexual activity until test results and treatment are complete.  Follow-up with provided contact information for gynecology if you continue to have no menstrual cycle.    ED Prescriptions     Medication Sig Dispense Auth. Provider   ondansetron (ZOFRAN-ODT) 4 MG disintegrating tablet Take 1 tablet (4 mg total) by mouth every 8 (eight) hours as needed for nausea or vomiting. 20 tablet [place], [place] E, [place]   cephALEXin (KEFLEX) 500 MG capsule Take 1 capsule (500 mg total) by mouth 4 (four) times daily. 28 capsule [place], Michele Rockers, [place]      PDMP not reviewed this encounter.   Teodora Medici, [place] 12/25/21 1334    Teodora Medici, [place] 12/25/21 1334

## 2021-12-25 NOTE — Discharge Instructions (Signed)
It appears that you are having a urinary tract infection which is being treated with antibiotics.  It also appears that you have a viral illness that should run its course.  A nausea medication has been prescribed to help treat this.  Please ensure adequate fluid hydration.  Vaginal swab and urine culture pending.  Blood work for pregnancy is pending as well.  We will call if there are any abnormalities.  Please refrain from sexual activity until test results and treatment are complete.  Follow-up with provided contact information for gynecology if you continue to have no menstrual cycle.

## 2021-12-25 NOTE — ED Triage Notes (Signed)
Pt presents today with diarrhea, nausea, loss of appetite and vomiting for the past 2 days. Pt denies fever. Pt states upper and lower back pain, no injury stated. Pt does not remember her menstrual cycle, denies any cramping.

## 2021-12-26 ENCOUNTER — Telehealth (HOSPITAL_COMMUNITY): Payer: Self-pay | Admitting: Emergency Medicine

## 2021-12-26 LAB — CERVICOVAGINAL ANCILLARY ONLY
Bacterial Vaginitis (gardnerella): POSITIVE — AB
Candida Glabrata: NEGATIVE
Candida Vaginitis: NEGATIVE
Chlamydia: POSITIVE — AB
Comment: NEGATIVE
Comment: NEGATIVE
Comment: NEGATIVE
Comment: NEGATIVE
Comment: NEGATIVE
Comment: NORMAL
Neisseria Gonorrhea: POSITIVE — AB
Trichomonas: NEGATIVE

## 2021-12-26 LAB — URINE CULTURE

## 2021-12-26 MED ORDER — METRONIDAZOLE 500 MG PO TABS: 500.0000 mg | ORAL_TABLET | Freq: Two times a day (BID) | ORAL | 0 refills | Status: DC

## 2021-12-26 MED ORDER — DOXYCYCLINE HYCLATE 100 MG PO CAPS: 100.0000 mg | ORAL_CAPSULE | Freq: Two times a day (BID) | ORAL | 0 refills | Status: AC

## 2021-12-27 ENCOUNTER — Telehealth (HOSPITAL_COMMUNITY): Payer: Self-pay | Admitting: Emergency Medicine

## 2021-12-27 NOTE — Telephone Encounter (Signed)
PEr protocol, patient to d/c KEflex.  WIll need treatment with IM ROcephin '500mg'$  for positive Gonorrhea.  Will also need treatment with Doxycycline and Metronidazole.   Attempted to reach patient x 1, LVM Prescription sent to pharmacy on file [facility] notified

## 2022-01-31 ENCOUNTER — Other Ambulatory Visit: Payer: Self-pay

## 2022-01-31 DIAGNOSIS — R921 Mammographic calcification found on diagnostic imaging of breast: Secondary | ICD-10-CM

## 2022-03-11 ENCOUNTER — Ambulatory Visit: Payer: No Typology Code available for payment source

## 2022-07-11 ENCOUNTER — Ambulatory Visit (HOSPITAL_COMMUNITY)
Admission: RE | Admit: 2022-07-11 | Discharge: 2022-07-11 | Disposition: A | Discharge status: Home / Self Care | Payer: No Typology Code available for payment source | Source: Ambulatory Visit | Attending: Internal Medicine | Admitting: Internal Medicine

## 2022-07-11 ENCOUNTER — Encounter (HOSPITAL_COMMUNITY): Payer: Self-pay

## 2022-07-11 VITALS — BP 136/87 | HR 82 | Temp 98.1°F | Resp 18

## 2022-07-11 DIAGNOSIS — M25531 Pain in right wrist: Secondary | ICD-10-CM

## 2022-07-11 DIAGNOSIS — Z3202 Encounter for pregnancy test, result negative: Secondary | ICD-10-CM

## 2022-07-11 LAB — POC URINE PREG, ED: Preg Test, Ur: NEGATIVE

## 2022-07-11 MED ORDER — IBUPROFEN 800 MG PO TABS
800.0000 mg | ORAL_TABLET | Freq: Once | ORAL | Status: AC
Start: 2022-07-11 — End: 2022-07-11
  Administered 2022-07-11: 800 mg via ORAL

## 2022-07-11 MED ORDER — IBUPROFEN 800 MG PO TABS
ORAL_TABLET | ORAL | Status: AC
  Filled 2022-07-11: qty 1

## 2022-07-11 NOTE — Discharge Instructions (Addendum)
You have likely sprained your right wrist. Continue to wear the wrist brace you already purchased. I gave you some ibuprofen in the clinic.  You may take 600 mg of ibuprofen every 6 hours as needed for pain.  May also take Tylenol 1000 mg every 6 hours as needed for pain.  Your pregnancy test in the clinic is negative.  Your menstrual cycle will likely start soon.  If your wrist pain does not get better in the next 1 to 2 weeks, you may follow-up with the orthopedic doctor listed on your paperwork for further evaluation.  If you develop any new or worsening symptoms or do not improve in the next 2 to 3 days, please return.  If your symptoms are severe, please go to the emergency room.  Follow-up with your primary care provider for further evaluation and management of your symptoms as well as ongoing wellness visits.  I hope you feel better!

## 2022-07-11 NOTE — ED Provider Notes (Signed)
[facility]    CSN: [id number] Arrival date & time: 07/11/22  1342      History   Chief Complaint Chief Complaint  Patient presents with   Wrist Pain   Possible Pregnancy    HPI Elizabeth Dean is a 41 y.o. female.   Patient presents urgent care for evaluation of right wrist pain that started 3 days ago after one of her patients at work fell onto her right wrist causing it to slightly hyperextend.  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  Patient has been using Tylenol at home for pain and states this has been helping.  She also bought a wrist brace at [facility] and has been wearing this for compression which she states has helped a lot.  Denies previous history to the right wrist, numbness and tingling to the bilateral upper extremities, decreased range of motion of the right wrist and right hand, and popping/clicking sensation/sound to the right wrist during injury.  She would also like to be tested for pregnancy as she is 2 days late for her menstrual cycle.  Menstrual cycles are usually regular but have been irregular in the past.  She is sexually active with female partner without condoms.  Denies vaginal itching, vaginal odor, and vaginal discharge.  No urinary symptoms.   Wrist Pain  Possible Pregnancy    Past Medical History:  Diagnosis Date   Anemia    Anxiety    Asthma    Bronchitis    Depression    GERD (gastroesophageal reflux disease)    Panic attacks     Patient Active Problem List   Diagnosis Date Noted   Hidradenitis suppurativa 11/12/2020   Healthcare maintenance 09/03/2015   Hypertriglyceridemia 04/04/2014   Asthma with acute exacerbation 04/04/2014   Need for prophylactic vaccination and inoculation against influenza 04/04/2014   Adjustment disorder with mixed anxiety and depressed  mood 02/14/2014   Muscle spasm 01/02/2014   Major depression 01/02/2014   Multiple falls 01/02/2014    Past Surgical History:  Procedure Laterality Date   BREAST SURGERY     CHOLECYSTECTOMY      OB History     Gravida  6   Para  4   Term  4   Preterm      AB  2   Living  4      SAB  2   IAB      Ectopic      Multiple      Live Births  4            Home Medications    Prior to Admission medications   Medication Sig Start Date End Date Taking? Authorizing Provider  loratadine (CLARITIN) 10 MG tablet Take 10 mg by mouth daily.    [provider]  metroNIDAZOLE (FLAGYL) 500 MG tablet Take 1 tablet (500 mg total) by mouth 2 (two) times daily. 12/26/21   Lamptey, Myrene Galas, MD  ondansetron (ZOFRAN-ODT) 4 MG disintegrating tablet Take 1 tablet (4 mg total) by mouth every 8 (eight) hours as needed for nausea or vomiting. 12/25/21   Teodora Medici, FNP    Family History Family History  Problem Relation Age of Onset   Heart disease Maternal Grandmother    Hypertension Maternal Grandmother  Breast cancer Mother     Social History Social History   Tobacco Use   Smoking status: Some Days    Packs/day: 1.00    Types: E-cigarettes, Cigarettes   Smokeless tobacco: Never   Tobacco comments:    not every day  Vaping Use   Vaping Use: Never used  Substance Use Topics   Alcohol use: Yes    Comment: socially   Drug use: No     Allergies   Hydrocodone and Codeine   Review of Systems Review of Systems Per HPI  Physical Exam Triage Vital Signs ED Triage Vitals  Enc Vitals Group     BP 07/11/22 1359 136/87     Pulse Rate 07/11/22 1359 82     Resp 07/11/22 1359 18     Temp 07/11/22 1359 98.1 F (36.7 C)     Temp Source 07/11/22 1359 Oral     SpO2 07/11/22 1359 97 %     Weight --      Height --      Head Circumference --      Peak Flow --      Pain Score 07/11/22 1357 5     Pain Loc --      Pain Edu? --      Excl. in [place]? --    No  data found.  Updated Vital Signs BP 136/87 (BP Location: Right Arm)   Pulse 82   Temp 98.1 F (36.7 C) (Oral)   Resp 18   LMP 06/09/2022 (Exact Date)   SpO2 97%   Visual Acuity Right Eye Distance:   Left Eye Distance:   Bilateral Distance:    Right Eye Near:   Left Eye Near:    Bilateral Near:     Physical Exam Vitals and nursing note reviewed.  Constitutional:      Appearance: She is not ill-appearing or toxic-appearing.  HENT:     Head: Normocephalic and atraumatic.     Right Ear: Hearing and external ear normal.     Left Ear: Hearing and external ear normal.     Nose: Nose normal.     Mouth/Throat:     Lips: Pink.  Eyes:     General: Lids are normal. Vision grossly intact. Gaze aligned appropriately.     Extraocular Movements: Extraocular movements intact.     Conjunctiva/sclera: Conjunctivae normal.  Pulmonary:     Effort: Pulmonary effort is normal.  Musculoskeletal:     Right wrist: No swelling, deformity, lacerations, tenderness, bony tenderness or snuff box tenderness. Normal range of motion. Normal pulse.     Left wrist: Normal.     Cervical back: Neck supple.     Comments: Normal right wrist exam.  +2 right radial pulse.  Less than 3 capillary refill to the right upper extremity.  5/5 grip strength to the right hand.  Skin:    General: Skin is warm and dry.     Capillary Refill: Capillary refill takes less than 2 seconds.     Findings: No rash.  Neurological:     General: No focal deficit present.     Mental Status: She is alert and oriented to person, place, and time. Mental status is at baseline.     Cranial Nerves: No dysarthria or facial asymmetry.  Psychiatric:        Mood and Affect: Mood normal.        Speech: Speech normal.        Behavior: Behavior normal.  Thought Content: Thought content normal.        Judgment: Judgment normal.      UC Treatments / Results  Labs (all labs ordered are listed, but only abnormal results are  displayed) Labs Reviewed  POC URINE PREG, ED    EKG   Radiology No results found.  Procedures Procedures (including critical care time)  Medications Ordered in UC Medications  ibuprofen (ADVIL) tablet 800 mg (has no administration in time range)    Initial Impression / Assessment and Plan / UC Course  I have reviewed the triage vital signs and the nursing notes.  Pertinent labs & imaging results that were available during my care of the patient were reviewed by me and considered in my medical decision making (see chart for details).   1.  Right wrist pain, negative pregnancy test Urine pregnancy test is negative.  Patient's menstrual cycle will likely start soon.  She is reassured.  Right wrist exam is without musculoskeletal abnormality.  She is neurovascularly intact distal to injury and therefore we deferred imaging today.  She may continue use of the right wrist brace for compression and may rest, elevate, and ice the wrist for further relief of inflammation.  800 mg of ibuprofen provided in clinic.  She may take 600 mg of ibuprofen or Tylenol 1000 mg every 6 hours as needed for pain and inflammation to the right wrist.  Follow-up with walking referral to [facility] provided should she experience new or worsening symptoms in the next 1 to 2 weeks.  Discussed physical exam and available lab work findings in clinic with patient.  Counseled patient regarding appropriate use of medications and potential side effects for all medications recommended or prescribed today. Discussed red flag signs and symptoms of worsening condition,when to call the PCP office, return to urgent care, and when to seek higher level of care in the emergency department. Patient verbalizes understanding and agreement with plan. All questions answered. Patient discharged in stable condition.    Final Clinical Impressions(s) / UC Diagnoses   Final diagnoses:  Right wrist pain  Negative pregnancy  test     Discharge Instructions      You have likely sprained your right wrist. Continue to wear the wrist brace you already purchased. I gave you some ibuprofen in the clinic.  You may take 600 mg of ibuprofen every 6 hours as needed for pain.  May also take Tylenol 1000 mg every 6 hours as needed for pain.  Your pregnancy test in the clinic is negative.  Your menstrual cycle will likely start soon.  If your wrist pain does not get better in the next 1 to 2 weeks, you may follow-up with the orthopedic doctor listed on your paperwork for further evaluation.  If you develop any new or worsening symptoms or do not improve in the next 2 to 3 days, please return.  If your symptoms are severe, please go to the emergency room.  Follow-up with your primary care provider for further evaluation and management of your symptoms as well as ongoing wellness visits.  I hope you feel better!   ED Prescriptions   None    PDMP not reviewed this encounter.   Talbot Grumbling,  07/11/22 1446

## 2022-07-11 NOTE — ED Triage Notes (Signed)
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   She is 2 days late on her cycle she would like a pregnancy test.

## 2022-07-23 ENCOUNTER — Ambulatory Visit
Admission: EM | Admit: 2022-07-23 | Discharge: 2022-07-23 | Disposition: A | Discharge status: Home / Self Care | Payer: Self-pay | Attending: Internal Medicine | Admitting: Internal Medicine

## 2022-07-23 ENCOUNTER — Ambulatory Visit (INDEPENDENT_AMBULATORY_CARE_PROVIDER_SITE_OTHER): Payer: No Typology Code available for payment source

## 2022-07-23 DIAGNOSIS — M25531 Pain in right wrist: Secondary | ICD-10-CM

## 2022-07-23 DIAGNOSIS — N926 Irregular menstruation, unspecified: Secondary | ICD-10-CM | POA: Insufficient documentation

## 2022-07-23 LAB — POCT URINE PREGNANCY: Preg Test, Ur: NEGATIVE

## 2022-07-23 NOTE — ED Provider Notes (Signed)
[name]    CSN: [id number] Arrival date & time: 07/23/22  1355      History   Chief Complaint Chief Complaint  Patient presents with   Wrist Injury    Right wrist injury x1 week    HPI Elizabeth Dean is a 42 y.o. female.   Patient presents with right wrist pain that started about 1.5 to 2 weeks ago.  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  She was seen in urgent care when the injury first occurred but no imaging was completed.  She has been taking over-the-counter medications with minimal improvement of symptoms.  Denies numbness or tingling.  Patient is also concerned today given that she has not had a menstrual cycle.  Patient reports last menstrual cycle was in November but not sure of exact date.  She had negative pregnancy test at previous urgent care visit on 12/22.  Patient patient reports that she has an appointment with OB/GYN in approximately 1 week for further evaluation of this.  She reports that she had 1 episode of vaginal spotting a few days prior which is now resolved.  Denies any abdominal pain, vaginal discharge, dysuria, urinary frequency, back pain, fever, pelvic pain.   Wrist Injury   Past Medical History:  Diagnosis Date   Anemia    Anxiety    Asthma    Bronchitis    Depression    GERD (gastroesophageal reflux disease)    Panic attacks     Patient Active Problem List   Diagnosis Date Noted   Hidradenitis suppurativa 11/12/2020   Healthcare maintenance 09/03/2015   Hypertriglyceridemia 04/04/2014   Asthma with acute exacerbation 04/04/2014   Need for prophylactic vaccination and inoculation against influenza 04/04/2014   Adjustment disorder with mixed anxiety and depressed mood 02/14/2014   Muscle spasm 01/02/2014   Major depression 01/02/2014   Multiple falls 01/02/2014    Past Surgical History:  Procedure Laterality Date   BREAST  SURGERY     CHOLECYSTECTOMY      OB History     Gravida  6   Para  4   Term  4   Preterm      AB  2   Living  4      SAB  2   IAB      Ectopic      Multiple      Live Births  4            Home Medications    Prior to Admission medications   Medication Sig Start Date End Date Taking? Authorizing Provider  loratadine (CLARITIN) 10 MG tablet Take 10 mg by mouth daily.    [provider]  metroNIDAZOLE (FLAGYL) 500 MG tablet Take 1 tablet (500 mg total) by mouth 2 (two) times daily. 12/26/21   Lamptey, Myrene Galas, MD  ondansetron (ZOFRAN-ODT) 4 MG disintegrating tablet Take 1 tablet (4 mg total) by mouth every 8 (eight) hours as needed for nausea or vomiting. 12/25/21   Teodora Medici, FNP    Family History Family History  Problem Relation Age of Onset   Heart disease Maternal Grandmother    Hypertension Maternal Grandmother    Breast cancer Mother     Social History Social History   Tobacco Use   Smoking status: Some Days    Packs/day: 1.00    Types: E-cigarettes, Cigarettes  Smokeless tobacco: Never   Tobacco comments:    not every day  Vaping Use   Vaping Use: Never used  Substance Use Topics   Alcohol use: Yes    Comment: socially   Drug use: No     Allergies   Hydrocodone and Codeine   Review of Systems Review of Systems Per HPI  Physical Exam Triage Vital Signs ED Triage Vitals  Enc Vitals Group     BP 07/23/22 1516 133/86     Pulse Rate 07/23/22 1516 88     Resp 07/23/22 1516 18     Temp 07/23/22 1516 97.9 F (36.6 C)     Temp src --      SpO2 07/23/22 1516 97 %     Weight 07/23/22 1513 155 lb 11.2 oz (70.6 kg)     Height 07/23/22 1513 '5\' 1"'$  (1.549 m)     Head Circumference --      Peak Flow --      Pain Score 07/23/22 1513 8     Pain Loc --      Pain Edu? --      Excl. in [place]? --    No data found.  Updated Vital Signs BP 133/86 (BP Location: Right Arm)   Pulse 88   Temp 97.9 F (36.6 C)   Resp 18   Ht  '5\' 1"'$  (1.549 m)   Wt 155 lb 11.2 oz (70.6 kg)   LMP 06/09/2022 (Exact Date)   SpO2 97%   BMI 29.42 kg/m   Visual Acuity Right Eye Distance:   Left Eye Distance:   Bilateral Distance:    Right Eye Near:   Left Eye Near:    Bilateral Near:     Physical Exam Constitutional:      General: She is not in acute distress.    Appearance: Normal appearance. She is not toxic-appearing or diaphoretic.  HENT:     Head: Normocephalic and atraumatic.  Eyes:     Extraocular Movements: Extraocular movements intact.     Conjunctiva/sclera: Conjunctivae normal.  Pulmonary:     Effort: Pulmonary effort is normal.  Genitourinary:    Comments: Deferred with shared decision making. Self swab performed.  Musculoskeletal:     Comments: Tenderness to palpation with associated mild swelling present throughout dorsal surface of right wrist.  No discoloration, lacerations, abrasions noted.  Limited range of motion of wrist due to pain.  Grip strength normal.  No tenderness to hand or fingers.  Neurovascular intact.  Neurological:     General: No focal deficit present.     Mental Status: She is alert and oriented to person, place, and time. Mental status is at baseline.  Psychiatric:        Mood and Affect: Mood normal.        Behavior: Behavior normal.        Thought Content: Thought content normal.        Judgment: Judgment normal.      UC Treatments / Results  Labs (all labs ordered are listed, but only abnormal results are displayed) Labs Reviewed  BETA HCG QUANT (REF LAB)  POCT URINE PREGNANCY  CERVICOVAGINAL ANCILLARY ONLY    EKG   Radiology DG Wrist Complete Right  Result Date: 07/23/2022 CLINICAL DATA:  Right wrist pain. EXAM: RIGHT WRIST - COMPLETE 3+ VIEW COMPARISON:  None Available. FINDINGS: There is no evidence of fracture or dislocation. There is no evidence of arthropathy or other focal bone abnormality. Soft tissues  are unremarkable. IMPRESSION: Negative. Electronically  Signed   By: Marijo Conception M.D.   On: 07/23/2022 15:55    Procedures Procedures (including critical care time)  Medications Ordered in UC Medications - No data to display  Initial Impression / Assessment and Plan / UC Course  I have reviewed the triage vital signs and the nursing notes.  Pertinent labs & imaging results that were available during my care of the patient were reviewed by me and considered in my medical decision making (see chart for details).     Right wrist x-ray was negative for any acute bony abnormality.  Suspect wrist sprain.  Discussed ice application, supportive care, elevation of extremity.  Wrist brace applied in urgent care by clinical staff.  Advised safe over-the-counter pain relievers to help with discomfort.  Given persistent symptoms, advised patient to follow-up with hand specialty at provided contact information for further evaluation and management.  Urine pregnancy test was negative.  Quantitative hCG pending. Vaginal swab pending to ensure that vaginitis is not because of missed menses.  Advised patient to keep scheduled appointment with OB/GYN for further evaluation and management of missed menses.  Patient verbalized understanding and was agreeable with plan. Final Clinical Impressions(s) / UC Diagnoses   Final diagnoses:  Right wrist pain  Missed menses     Discharge Instructions      Your wrist x-ray was normal. suspect wrist sprain.  Applied wrist brace today.  Apply ice application and elevate extremity.  Follow-up with orthopedic hand specialty at provided contact information tomorrow to schedule appointment for further evaluation.  Urine pregnancy test was negative.  Blood work for pregnancy testing is pending.  We will call if it is positive.   Keep scheduled appointment with gynecology for further evaluation and management of missed menstrual cycle.  Vaginal swab is pending.  We will call if it is abnormal.     ED Prescriptions    None    PDMP not reviewed this encounter.   Teodora Medici, [place] 07/23/22 [phone]

## 2022-07-23 NOTE — Discharge Instructions (Signed)
Your wrist x-ray was normal. suspect wrist sprain.  Applied wrist brace today.  Apply ice application and elevate extremity.  Follow-up with orthopedic hand specialty at provided contact information tomorrow to schedule appointment for further evaluation.  Urine pregnancy test was negative.  Blood work for pregnancy testing is pending.  We will call if it is positive.   Keep scheduled appointment with gynecology for further evaluation and management of missed menstrual cycle.  Vaginal swab is pending.  We will call if it is abnormal.

## 2022-07-23 NOTE — ED Triage Notes (Signed)
Pt states that she has an injury to her right wrist. X1 week

## 2022-07-24 LAB — CERVICOVAGINAL ANCILLARY ONLY
Bacterial Vaginitis (gardnerella): POSITIVE — AB
Candida Glabrata: NEGATIVE
Candida Vaginitis: NEGATIVE
Chlamydia: POSITIVE — AB
Comment: NEGATIVE
Comment: NEGATIVE
Comment: NEGATIVE
Comment: NEGATIVE
Comment: NEGATIVE
Comment: NORMAL
Neisseria Gonorrhea: POSITIVE — AB
Trichomonas: NEGATIVE

## 2022-07-24 LAB — BETA HCG QUANT (REF LAB): hCG Quant: <1 m[IU]/mL

## 2022-07-25 ENCOUNTER — Telehealth (HOSPITAL_COMMUNITY): Payer: Self-pay | Admitting: Emergency Medicine

## 2022-07-25 MED ORDER — METRONIDAZOLE 0.75 % VA GEL: 1.0000 | Freq: Every day | VAGINAL | 0 refills | Status: AC

## 2022-07-25 MED ORDER — DOXYCYCLINE HYCLATE 100 MG PO CAPS: 100.0000 mg | ORAL_CAPSULE | Freq: Two times a day (BID) | ORAL | 0 refills | Status: AC

## 2022-07-25 NOTE — Telephone Encounter (Signed)
Per protocol, patient will need treatment with IM Rocephin '500mg'$  for positive Gonorrhea. Will also need treatment with Doxycycline and Metrogel. Attempted to reach patient x 1, LVM Prescription sent to pharmacy on file [facility] notified

## 2023-05-18 ENCOUNTER — Ambulatory Visit: Payer: No Typology Code available for payment source

## 2023-05-18 ENCOUNTER — Other Ambulatory Visit: Payer: Self-pay

## 2023-05-18 ENCOUNTER — Ambulatory Visit
Admission: EM | Admit: 2023-05-18 | Discharge: 2023-05-18 | Disposition: A | Discharge status: Home / Self Care | Payer: Self-pay | Attending: Internal Medicine | Admitting: Internal Medicine

## 2023-05-18 DIAGNOSIS — M7918 Myalgia, other site: Secondary | ICD-10-CM

## 2023-05-18 DIAGNOSIS — W19XXXA Unspecified fall, initial encounter: Secondary | ICD-10-CM

## 2023-05-18 DIAGNOSIS — R519 Headache, unspecified: Secondary | ICD-10-CM

## 2023-05-18 LAB — POCT URINE PREGNANCY: Preg Test, Ur: NEGATIVE

## 2023-05-18 NOTE — ED Notes (Signed)
At bedside for provider exam 

## 2023-05-18 NOTE — ED Triage Notes (Signed)
Patient states she fell down the stairs yesterday, states she hurt left side of face and buttocks. Treated with arthritis cream.

## 2023-05-18 NOTE — Discharge Instructions (Signed)
I will call if x-ray results are abnormal.  Apply ice to affected areas.  Follow-up with orthopedist if pain persists or worsens.  If facial pain or swelling persists or worsens or if you develop headache, dizziness, blurred vision, nausea, vomiting please go straight to the emergency department.

## 2023-05-18 NOTE — ED Provider Notes (Incomplete)
[name]    CSN: [id number] Arrival date & time: 05/18/23  1655      History   Chief Complaint No chief complaint on file.   HPI Elizabeth Dean is a 42 y.o. female.   Patient presents for further evaluation of left-sided facial pain and left buttocks pain after fall that occurred last night.  Patient reports that she got up in the middle the night to get some water but did not turn the lights on causing her to subsequently fall down a whole flight of stairs.  She states that she hit her face on the staircase but did not lose consciousness.  She denies that she takes any blood thinning medications.  Reports that she had a headache yesterday but denies any current headache, dizziness, blurred vision, nausea, vomiting.  Also stating that her left buttocks hurt as she landed on her buttocks.  Denies urinary frequency, urinary or bowel continence, saddle anesthesia.     Past Medical History:  Diagnosis Date   Anemia    Anxiety    Asthma    Bronchitis    Depression    GERD (gastroesophageal reflux disease)    Panic attacks     Patient Active Problem List   Diagnosis Date Noted   Hidradenitis suppurativa 11/12/2020   Healthcare maintenance 09/03/2015   Hypertriglyceridemia 04/04/2014   Asthma with acute exacerbation 04/04/2014   Need for prophylactic vaccination and inoculation against influenza 04/04/2014   Adjustment disorder with mixed anxiety and depressed mood 02/14/2014   Muscle spasm 01/02/2014   Major depression 01/02/2014   Multiple falls 01/02/2014    Past Surgical History:  Procedure Laterality Date   BREAST SURGERY     CHOLECYSTECTOMY      OB History     Gravida  6   Para  4   Term  4   Preterm      AB  2   Living  4      SAB  2   IAB      Ectopic      Multiple      Live Births  4            Home Medications    Prior to Admission medications   Medication Sig Start Date End Date Taking? Authorizing Provider   loratadine (CLARITIN) 10 MG tablet Take 10 mg by mouth daily.    [provider]  metroNIDAZOLE (FLAGYL) 500 MG tablet Take 1 tablet (500 mg total) by mouth 2 (two) times daily. 12/26/21   Lamptey, Britta Mccreedy, MD  ondansetron (ZOFRAN-ODT) 4 MG disintegrating tablet Take 1 tablet (4 mg total) by mouth every 8 (eight) hours as needed for nausea or vomiting. 12/25/21   Gustavus Bryant, FNP    Family History Family History  Problem Relation Age of Onset   Heart disease Maternal Grandmother    Hypertension Maternal Grandmother    Breast cancer Mother     Social History Social History   Tobacco Use   Smoking status: Some Days    Current packs/day: 1.00    Types: E-cigarettes, Cigarettes   Smokeless tobacco: Never   Tobacco comments:    not every day  Vaping Use   Vaping status: Never Used  Substance Use Topics   Alcohol use: Not Currently    Comment: socially   Drug use: No     Allergies   Hydrocodone and Codeine   Review of Systems Review of Systems Per HPI  Physical Exam Triage Vital Signs ED Triage Vitals  Encounter Vitals Group     BP 05/18/23 1705 125/82     Systolic BP Percentile --      Diastolic BP Percentile --      Pulse Rate 05/18/23 1705 83     Resp --      Temp 05/18/23 1705 98.2 F (36.8 C)     Temp Source 05/18/23 1705 Oral     SpO2 05/18/23 1705 98 %     Weight 05/18/23 1704 138 lb (62.6 kg)     Height 05/18/23 1704 5' (1.524 m)     Head Circumference --      Peak Flow --      Pain Score 05/18/23 1703 8     Pain Loc --      Pain Education --      Exclude from Growth Chart --    No data found.  Updated Vital Signs BP 125/82 (BP Location: Left Arm)   Pulse 83   Temp 98.2 F (36.8 C) (Oral)   Ht 5' (1.524 m)   Wt 138 lb (62.6 kg)   LMP 03/24/2023   SpO2 98%   BMI 26.95 kg/m   Visual Acuity Right Eye Distance:   Left Eye Distance:   Bilateral Distance:    Right Eye Near:   Left Eye Near:    Bilateral Near:     Physical  Exam Exam conducted with a chaperone present.  Constitutional:      General: She is not in acute distress.    Appearance: Normal appearance. She is not toxic-appearing or diaphoretic.  HENT:     Head: Normocephalic and atraumatic.     Comments: Patient has mild swelling and bruising discoloration scattered throughout the left upper forehead, around the orbits of the, that extends slightly into the left side cheek of face.  Patient can open and close jaw.  No lacerations or abrasions noted. Eyes:     Extraocular Movements: Extraocular movements intact.     Conjunctiva/sclera: Conjunctivae normal.  Pulmonary:     Effort: Pulmonary effort is normal.  Musculoskeletal:       Legs:     Comments: Tenderness to palpation overlying the left buttocks.  No direct tenderness over tailbone.  No discoloration, swelling, lacerations, abrasions noted.  Neurological:     General: No focal deficit present.     Mental Status: She is alert and oriented to person, place, and time. Mental status is at baseline.     Cranial Nerves: Cranial nerves 2-12 are intact.     Sensory: Sensation is intact.     Motor: Motor function is intact.     Coordination: Coordination is intact.     Gait: Gait is intact.  Psychiatric:        Mood and Affect: Mood normal.        Behavior: Behavior normal.        Thought Content: Thought content normal.        Judgment: Judgment normal.      UC Treatments / Results  Labs (all labs ordered are listed, but only abnormal results are displayed) Labs Reviewed  POCT URINE PREGNANCY    EKG   Radiology No results found.  Procedures Procedures (including critical care time)  Medications Ordered in UC Medications - No data to display  Initial Impression / Assessment and Plan / UC Course  I have reviewed the triage vital signs and the nursing notes.  Pertinent labs &  imaging results that were available during my care of the patient were reviewed by me and considered  in my medical decision making (see chart for details).     *** Final Clinical Impressions(s) / UC Diagnoses   Final diagnoses:  Fall, initial encounter  Buttock pain  Facial pain     Discharge Instructions      I will call if x-ray results are abnormal.  Apply ice to affected areas.  Follow-up with orthopedist if pain persists or worsens.  If facial pain or swelling persists or worsens or if you develop headache, dizziness, blurred vision, nausea, vomiting please go straight to the emergency department.   ED Prescriptions   None    PDMP not reviewed this encounter.

## 2023-11-12 ENCOUNTER — Ambulatory Visit
Admission: EM | Admit: 2023-11-12 | Discharge: 2023-11-12 | Disposition: A | Discharge status: Home / Self Care | Payer: Self-pay | Attending: Family Medicine | Admitting: Family Medicine

## 2023-11-12 ENCOUNTER — Ambulatory Visit (INDEPENDENT_AMBULATORY_CARE_PROVIDER_SITE_OTHER)

## 2023-11-12 ENCOUNTER — Encounter: Payer: Self-pay | Admitting: Emergency Medicine

## 2023-11-12 DIAGNOSIS — S93602A Unspecified sprain of left foot, initial encounter: Secondary | ICD-10-CM

## 2023-11-12 DIAGNOSIS — S99922A Unspecified injury of left foot, initial encounter: Secondary | ICD-10-CM

## 2023-11-12 LAB — POCT URINE PREGNANCY: Preg Test, Ur: NEGATIVE

## 2023-11-12 MED ORDER — NAPROXEN 500 MG PO TABS: 500.0000 mg | ORAL_TABLET | Freq: Two times a day (BID) | ORAL | 0 refills | Status: DC | PRN

## 2023-11-12 MED ORDER — PREDNISONE 20 MG PO TABS: 20.0000 mg | ORAL_TABLET | Freq: Every day | ORAL | 0 refills | Status: AC

## 2023-11-12 NOTE — ED Provider Notes (Signed)
 [name]    CSN: [id number] Arrival date & time: 11/12/23  0811      History   Chief Complaint Chief Complaint  Patient presents with   L foot swelling    HPI Elizabeth Dean is a 43 y.o. female.   HPI Patient here today with a 5 day strain of left dorsum foot pain localized swelling and bruising.  Reports being at the beach on last week and wearing sandals and admittedly consuming alcohol.  Reports the next day waking up with foot pain.  She is unaware if she twisted or somehow injured her foot the previous night.  Reports pain has gradually worsened over the course of the last 5 days.  Pain with movement of her toes and difficulty with putting a shoe on comfortably due to swelling and pain.  Denies any loss of sensation involving the foot. Past Medical History:  Diagnosis Date   Anemia    Anxiety    Asthma    Bronchitis    Depression    GERD (gastroesophageal reflux disease)    Panic attacks     Patient Active Problem List   Diagnosis Date Noted   Hidradenitis suppurativa 11/12/2020   Healthcare maintenance 09/03/2015   Hypertriglyceridemia 04/04/2014   Asthma with acute exacerbation 04/04/2014   Need for prophylactic vaccination and inoculation against influenza 04/04/2014   Adjustment disorder with mixed anxiety and depressed mood 02/14/2014   Muscle spasm 01/02/2014   Major depression 01/02/2014   Multiple falls 01/02/2014    Past Surgical History:  Procedure Laterality Date   BREAST SURGERY     CHOLECYSTECTOMY      OB History     Gravida  6   Para  4   Term  4   Preterm      AB  2   Living  4      SAB  2   IAB      Ectopic      Multiple      Live Births  4            Home Medications    Prior to Admission medications   Medication Sig Start Date End Date Taking? Authorizing Provider  naproxen  (NAPROSYN ) 500 MG tablet Take 1 tablet (500 mg total) by mouth 2 (two) times daily as needed for mild pain (pain score  1-3) or moderate pain (pain score 4-6) (take with food). 11/12/23  Yes Buena Carmine, NP  predniSONE  (DELTASONE ) 20 MG tablet Take 1 tablet (20 mg total) by mouth daily with breakfast for 5 days. 11/12/23 11/17/23 Yes Buena Carmine, NP  loratadine (CLARITIN) 10 MG tablet Take 10 mg by mouth daily. Patient not taking: Reported on 11/12/2023    [provider]  metroNIDAZOLE  (FLAGYL ) 500 MG tablet Take 1 tablet (500 mg total) by mouth 2 (two) times daily. Patient not taking: Reported on 11/12/2023 12/26/21   Corine Dice, MD  ondansetron  (ZOFRAN -ODT) 4 MG disintegrating tablet Take 1 tablet (4 mg total) by mouth every 8 (eight) hours as needed for nausea or vomiting. Patient not taking: Reported on 11/12/2023 12/25/21   Dodson Freestone, FNP    Family History Family History  Problem Relation Age of Onset   Heart disease Maternal Grandmother    Hypertension Maternal Grandmother    Breast cancer Mother     Social History Social History   Tobacco Use   Smoking status: Former    Types: E-cigarettes, Cigarettes  Quit date: 10/20/2022    Years since quitting: 1.0   Smokeless tobacco: Never   Tobacco comments:    not every day  Vaping Use   Vaping status: Never Used  Substance Use Topics   Alcohol use: Not Currently    Comment: socially   Drug use: No     Allergies   Hydrocodone and Codeine   Review of Systems Review of Systems Pertinent negatives listed in HPI   Physical Exam Triage Vital Signs ED Triage Vitals [11/12/23 0903]  Encounter Vitals Group     BP (!) 144/97     Systolic BP Percentile      Diastolic BP Percentile      Pulse Rate 79     Resp 16     Temp 98.8 F (37.1 C)     Temp Source Oral     SpO2 97 %     Weight      Height      Head Circumference      Peak Flow      Pain Score 7     Pain Loc      Pain Education      Exclude from Growth Chart    No data found.  Updated Vital Signs BP (!) 144/97 (BP Location: Left Arm)   Pulse 79    Temp 98.8 F (37.1 C) (Oral)   Resp 16   LMP 10/16/2023 (Approximate)   SpO2 97%   Visual Acuity Right Eye Distance:   Left Eye Distance:   Bilateral Distance:    Right Eye Near:   Left Eye Near:    Bilateral Near:     Physical Exam Vitals reviewed.  Constitutional:      Appearance: Normal appearance.  HENT:     Head: Normocephalic and atraumatic.  Cardiovascular:     Rate and Rhythm: Normal rate and regular rhythm.  Pulmonary:     Effort: Pulmonary effort is normal.     Breath sounds: Normal breath sounds.  Musculoskeletal:     Left foot: Decreased range of motion. Swelling and tenderness present. No deformity.  Skin:    General: Skin is warm and dry.  Neurological:     Mental Status: She is alert.      UC Treatments / Results  Labs (all labs ordered are listed, but only abnormal results are displayed) Labs Reviewed  POCT URINE PREGNANCY - Normal    EKG   Radiology DG Foot Complete Left Result Date: 11/12/2023 CLINICAL DATA:  Third and 4 metatarsal bruising EXAM: LEFT FOOT - COMPLETE 3+ VIEW COMPARISON:  None Available. FINDINGS: There is no evidence of fracture or dislocation. There is no evidence of arthropathy or other focal bone abnormality. Soft tissues are unremarkable. IMPRESSION: Negative. Electronically Signed   By: Fredrich Jefferson M.D.   On: 11/12/2023 10:33    Procedures Procedures (including critical care time)  Medications Ordered in UC Medications - No data to display  Initial Impression / Assessment and Plan / UC Course  I have reviewed the triage vital signs and the nursing notes.  Pertinent labs & imaging results that were available during my care of the patient were reviewed by me and considered in my medical decision making (see chart for details).    Left foot injury suspect sprain injury given imaging is negative for any acute fracture. Treatment with RICE and prednisone  20 mg once daily for 5 days.  Patient can then transition to  Naprosyn  if needed twice daily for  pain.  Patient advised to follow-up with [facility] if symptoms worsen or do not improve with current treatment. Final Clinical Impressions(s) / UC Diagnoses   Final diagnoses:  Foot injury, left, initial encounter  Foot sprain, left, initial encounter     Discharge Instructions      No fracture seen on x-ray. Suspect a sprain injury. Recommend wrapping in a ACE wrap daily for 5 days and treatment with prednisone  20 mg daily for 5 days then start Naprosyn  500 mg twice daily as needed for residual pain. While at home elevate foot and apply ice several times per day as tolerated for the next 5-7 day.     ED Prescriptions     Medication Sig Dispense Auth. Provider   predniSONE  (DELTASONE ) 20 MG tablet Take 1 tablet (20 mg total) by mouth daily with breakfast for 5 days. 5 tablet Buena Carmine, NP   naproxen  (NAPROSYN ) 500 MG tablet Take 1 tablet (500 mg total) by mouth 2 (two) times daily as needed for mild pain (pain score 1-3) or moderate pain (pain score 4-6) (take with food). 30 tablet Buena Carmine, NP      PDMP not reviewed this encounter.   Buena Carmine, NP 11/12/23 1054

## 2023-11-12 NOTE — Discharge Instructions (Addendum)
 No fracture seen on x-ray. Suspect a sprain injury. Recommend wrapping in a ACE wrap daily for 5 days and treatment with prednisone  20 mg daily for 5 days then start Naprosyn  500 mg twice daily as needed for residual pain. While at home elevate foot and apply ice several times per day as tolerated for the next 5-7 day.

## 2023-11-12 NOTE — ED Triage Notes (Signed)
 Pt reports left foot swelling x5 days. No specific injury or trauma to area. States she was at the beach walking in a pair of sandals all weekend that she thought had just rubbed and irritated the top of her foot, but started having swelling to top of L foot and lateral ankle. Swelling briefly improves with elevation but come back. Reduced ROM - decreased plantar flexion and lateral movements. No med use, but using ice packs with little relief.

## 2023-12-11 ENCOUNTER — Ambulatory Visit
Admission: EM | Admit: 2023-12-11 | Discharge: 2023-12-11 | Disposition: A | Discharge status: Home / Self Care | Attending: Family Medicine | Admitting: Family Medicine

## 2023-12-11 ENCOUNTER — Encounter: Payer: Self-pay | Admitting: *Deleted

## 2023-12-11 ENCOUNTER — Ambulatory Visit: Admit: 2023-12-11 | Discharge: 2023-12-11 | Disposition: A | Discharge status: Home / Self Care

## 2023-12-11 ENCOUNTER — Other Ambulatory Visit: Payer: Self-pay

## 2023-12-11 DIAGNOSIS — N926 Irregular menstruation, unspecified: Secondary | ICD-10-CM | POA: Insufficient documentation

## 2023-12-11 DIAGNOSIS — N3090 Cystitis, unspecified without hematuria: Secondary | ICD-10-CM | POA: Insufficient documentation

## 2023-12-11 LAB — POCT URINALYSIS DIP (MANUAL ENTRY)
Bilirubin, UA: NEGATIVE
Glucose, UA: NEGATIVE mg/dL
Ketones, POC UA: NEGATIVE mg/dL
Nitrite, UA: NEGATIVE
Protein Ur, POC: 30 mg/dL — AB
Spec Grav, UA: 1.02 (ref 1.010–1.025)
Urobilinogen, UA: 0.2 U/dL
pH, UA: 8 (ref 5.0–8.0)

## 2023-12-11 LAB — POCT URINE PREGNANCY: Preg Test, Ur: NEGATIVE

## 2023-12-11 MED ORDER — ONDANSETRON 4 MG PO TBDP: 4.0000 mg | ORAL_TABLET | Freq: Three times a day (TID) | ORAL | 0 refills | Status: AC | PRN

## 2023-12-11 MED ORDER — PHENAZOPYRIDINE HCL 100 MG PO TABS: 100.0000 mg | ORAL_TABLET | Freq: Three times a day (TID) | ORAL | 0 refills | Status: AC | PRN

## 2023-12-11 MED ORDER — NITROFURANTOIN MONOHYD MACRO 100 MG PO CAPS: 100.0000 mg | ORAL_CAPSULE | Freq: Two times a day (BID) | ORAL | 0 refills | Status: AC

## 2023-12-11 NOTE — Discharge Instructions (Signed)
 The pregnancy test is negative.  The urinalysis has some white blood cells and red blood cells, consistent with a bladder infection.  Take nitrofurantoin 100 mg--1 capsule 2 times daily for 5 days  Take Pyridium/phenazopyridine 100 mg--1 tablet 3 times daily as needed for urinary pain.  This medication usually makes the urine orange  Ondansetron  dissolved in the mouth every 8 hours as needed for nausea or vomiting.  Urine culture is sent, and staff will notify you that it looks like the antibiotic needs to be changed.

## 2023-12-11 NOTE — ED Triage Notes (Signed)
 Pt reports UTI Sx x 3 days. Co pressure and she has noticed some blood when she wipes.

## 2023-12-11 NOTE — ED Provider Notes (Addendum)
 [name]    CSN: [id number] Arrival date & time: 12/11/23  0945      History   Chief Complaint Chief Complaint  Patient presents with   UTI Symptoms    HPI Elizabeth Dean is a 43 y.o. female.   HPI Here for pelvic pressure and pain with urination and urinary frequency and incomplete bladder emptying.  Symptoms began about 3 days ago.  No fever or chills and no vomiting, but she has had some nausea.  Last menstrual cycle was about April 16.  She states she does have irregular cycles, but cannot totally rule out the possibility of pregnancy. Past Medical History:  Diagnosis Date   Anemia    Anxiety    Asthma    Bronchitis    Depression    GERD (gastroesophageal reflux disease)    Panic attacks     Patient Active Problem List   Diagnosis Date Noted   Hidradenitis suppurativa 11/12/2020   Healthcare maintenance 09/03/2015   Hypertriglyceridemia 04/04/2014   Asthma with acute exacerbation 04/04/2014   Need for prophylactic vaccination and inoculation against influenza 04/04/2014   Adjustment disorder with mixed anxiety and depressed mood 02/14/2014   Muscle spasm 01/02/2014   Major depression 01/02/2014   Multiple falls 01/02/2014    Past Surgical History:  Procedure Laterality Date   BREAST SURGERY     CHOLECYSTECTOMY      OB History     Gravida  6   Para  4   Term  4   Preterm      AB  2   Living  4      SAB  2   IAB      Ectopic      Multiple      Live Births  4            Home Medications    Prior to Admission medications   Medication Sig Start Date End Date Taking? Authorizing Provider  nitrofurantoin, macrocrystal-monohydrate, (MACROBID) 100 MG capsule Take 1 capsule (100 mg total) by mouth 2 (two) times daily. 12/11/23  Yes Ann Keto, MD  ondansetron  (ZOFRAN -ODT) 4 MG disintegrating tablet Take 1 tablet (4 mg total) by mouth every 8 (eight) hours as needed for nausea or vomiting. 12/11/23  Yes  Halim Surrette K, MD  phenazopyridine (PYRIDIUM) 100 MG tablet Take 1 tablet (100 mg total) by mouth 3 (three) times daily as needed (urinary pain). 12/11/23  Yes Fayola Meckes K, MD  loratadine (CLARITIN) 10 MG tablet Take 10 mg by mouth daily. Patient not taking: Reported on 11/12/2023    [provider]    Family History Family History  Problem Relation Age of Onset   Heart disease Maternal Grandmother    Hypertension Maternal Grandmother    Breast cancer Mother     Social History Social History   Tobacco Use   Smoking status: Former    Types: E-cigarettes, Cigarettes    Quit date: 10/20/2022    Years since quitting: 1.1   Smokeless tobacco: Never   Tobacco comments:    not every day  Vaping Use   Vaping status: Never Used  Substance Use Topics   Alcohol use: Not Currently    Comment: socially   Drug use: No     Allergies   Hydrocodone and Codeine    Review of Systems Review of Systems   Physical Exam Triage Vital Signs ED Triage Vitals  Encounter Vitals Group  BP 12/11/23 1010 (!) 143/99     Systolic BP Percentile --      Diastolic BP Percentile --      Pulse Rate 12/11/23 1010 82     Resp 12/11/23 1010 16     Temp 12/11/23 1010 98.4 F (36.9 C)     Temp Source 12/11/23 1010 Oral     SpO2 12/11/23 1010 98 %     Weight --      Height --      Head Circumference --      Peak Flow --      Pain Score 12/11/23 1011 4     Pain Loc --      Pain Education --      Exclude from Growth Chart --    No data found.  Updated Vital Signs BP (!) 143/99 (BP Location: Left Arm)   Pulse 82   Temp 98.4 F (36.9 C) (Oral)   Resp 16   LMP 11/04/2023 (Approximate)   SpO2 98%   Visual Acuity Right Eye Distance:   Left Eye Distance:   Bilateral Distance:    Right Eye Near:   Left Eye Near:    Bilateral Near:     Physical Exam Vitals reviewed.  Constitutional:      General: She is not in acute distress.    Appearance: She is not  toxic-appearing.  HENT:     Mouth/Throat:     Mouth: Mucous membranes are moist.  Cardiovascular:     Rate and Rhythm: Normal rate and regular rhythm.     Heart sounds: No murmur heard. Pulmonary:     Effort: Pulmonary effort is normal.     Breath sounds: Normal breath sounds.  Abdominal:     Palpations: Abdomen is soft.     Tenderness: There is no abdominal tenderness.  Skin:    Coloration: Skin is not pale.  Neurological:     General: No focal deficit present.     Mental Status: She is alert and oriented to person, place, and time.  Psychiatric:        Behavior: Behavior normal.      UC Treatments / Results  Labs (all labs ordered are listed, but only abnormal results are displayed) Labs Reviewed  POCT URINALYSIS DIP (MANUAL ENTRY) - Abnormal; Notable for the following components:      Result Value   Color, UA light yellow (*)    Clarity, UA cloudy (*)    Blood, UA small (*)    Protein Ur, POC =30 (*)    Leukocytes, UA Small (1+) (*)    All other components within normal limits  POCT URINE PREGNANCY - Normal  URINE CULTURE    EKG   Radiology No results found.  Procedures Procedures (including critical care time)  Medications Ordered in UC Medications - No data to display  Initial Impression / Assessment and Plan / UC Course  I have reviewed the triage vital signs and the nursing notes.  Pertinent labs & imaging results that were available during my care of the patient were reviewed by me and considered in my medical decision making (see chart for details).     UPT is negative. Urinalysis shows a small amount of leukocytes and small amount of blood.  History is most consistent with a UTI.  Nitrofurantoin is sent in to treat along with Pyridium for the symptoms.  Urine culture is sent and we will notify her if it looks like she needs  a different antibiotic. Final Clinical Impressions(s) / UC Diagnoses   Final diagnoses:  Cystitis  Irregular menses      Discharge Instructions      The pregnancy test is negative.  The urinalysis has some white blood cells and red blood cells, consistent with a bladder infection.  Take nitrofurantoin 100 mg--1 capsule 2 times daily for 5 days  Take Pyridium/phenazopyridine 100 mg--1 tablet 3 times daily as needed for urinary pain.  This medication usually makes the urine orange  Ondansetron  dissolved in the mouth every 8 hours as needed for nausea or vomiting.  Urine culture is sent, and staff will notify you that it looks like the antibiotic needs to be changed.    ED Prescriptions     Medication Sig Dispense Auth. Provider   nitrofurantoin, macrocrystal-monohydrate, (MACROBID) 100 MG capsule Take 1 capsule (100 mg total) by mouth 2 (two) times daily. 10 capsule Rihanna Marseille K, MD   phenazopyridine (PYRIDIUM) 100 MG tablet Take 1 tablet (100 mg total) by mouth 3 (three) times daily as needed (urinary pain). 10 tablet Ann Keto, MD   ondansetron  (ZOFRAN -ODT) 4 MG disintegrating tablet Take 1 tablet (4 mg total) by mouth every 8 (eight) hours as needed for nausea or vomiting. 10 tablet Ellsworth Haas Larysa Pall K, MD      PDMP not reviewed this encounter.   Ann Keto, MD 12/11/23 1047    Ann Keto, MD 12/11/23 [phone]

## 2023-12-14 ENCOUNTER — Ambulatory Visit (HOSPITAL_COMMUNITY): Payer: Self-pay

## 2023-12-14 LAB — URINE CULTURE: Culture: 50000 — AB

## 2023-12-14 MED ORDER — SULFAMETHOXAZOLE-TRIMETHOPRIM 800-160 MG PO TABS: 1.0000 | ORAL_TABLET | Freq: Two times a day (BID) | ORAL | 0 refills | Status: AC

## 2024-01-28 ENCOUNTER — Other Ambulatory Visit: Payer: Self-pay

## 2024-01-28 ENCOUNTER — Encounter: Payer: Self-pay | Admitting: Obstetrics and Gynecology

## 2024-02-01 ENCOUNTER — Other Ambulatory Visit: Payer: Self-pay

## 2024-02-01 DIAGNOSIS — R921 Mammographic calcification found on diagnostic imaging of breast: Secondary | ICD-10-CM

## 2024-03-15 ENCOUNTER — Telehealth: Payer: Self-pay | Admitting: *Deleted

## 2024-03-15 NOTE — Telephone Encounter (Signed)
 Reason for CRM: The patient is schedule for a new pt/ ov on 9/29 at 2 pm with Sula Leavy Rode. The patient does not have insurance and wants to know how much the visit will cost?

## 2024-04-07 ENCOUNTER — Ambulatory Visit

## 2024-04-07 ENCOUNTER — Encounter

## 2024-04-07 ENCOUNTER — Other Ambulatory Visit

## 2024-04-18 ENCOUNTER — Ambulatory Visit
